# Patient Record
Sex: Male | Born: 1955 | Race: White | Hispanic: No | Marital: Married | Smoking: Current every day smoker
Health system: Southern US, Community
[De-identification: ages and names within clinical notes are randomized; demographics above are authoritative.]

## PROBLEM LIST (undated history)

## (undated) ENCOUNTER — Encounter
Attending: Student in an Organized Health Care Education/Training Program | Primary: Student in an Organized Health Care Education/Training Program

## (undated) ENCOUNTER — Telehealth

## (undated) ENCOUNTER — Encounter

## (undated) ENCOUNTER — Ambulatory Visit

## (undated) ENCOUNTER — Encounter: Attending: Pulmonary Disease | Primary: Pulmonary Disease

## (undated) ENCOUNTER — Ambulatory Visit
Payer: BLUE CROSS/BLUE SHIELD | Attending: Student in an Organized Health Care Education/Training Program | Primary: Student in an Organized Health Care Education/Training Program

## (undated) ENCOUNTER — Telehealth
Attending: Student in an Organized Health Care Education/Training Program | Primary: Student in an Organized Health Care Education/Training Program

## (undated) ENCOUNTER — Ambulatory Visit
Payer: MEDICAID | Attending: Student in an Organized Health Care Education/Training Program | Primary: Student in an Organized Health Care Education/Training Program

## (undated) ENCOUNTER — Ambulatory Visit
Attending: Student in an Organized Health Care Education/Training Program | Primary: Student in an Organized Health Care Education/Training Program

## (undated) ENCOUNTER — Other Ambulatory Visit

## (undated) ENCOUNTER — Ambulatory Visit: Payer: MEDICAID

## (undated) ENCOUNTER — Encounter: Attending: Clinical | Primary: Clinical

## (undated) ENCOUNTER — Ambulatory Visit
Payer: MEDICARE | Attending: Student in an Organized Health Care Education/Training Program | Primary: Student in an Organized Health Care Education/Training Program

## (undated) ENCOUNTER — Ambulatory Visit: Attending: Pharmacist | Primary: Pharmacist

## (undated) ENCOUNTER — Telehealth: Attending: Family | Primary: Family

## (undated) ENCOUNTER — Inpatient Hospital Stay

## (undated) ENCOUNTER — Ambulatory Visit: Payer: MEDICARE

## (undated) ENCOUNTER — Ambulatory Visit: Payer: MEDICAID | Attending: Hospitalist | Primary: Hospitalist

## (undated) ENCOUNTER — Ambulatory Visit: Attending: Clinical | Primary: Clinical

## (undated) ENCOUNTER — Ambulatory Visit: Payer: MEDICAID | Attending: Cardiovascular Disease | Primary: Cardiovascular Disease

## (undated) ENCOUNTER — Ambulatory Visit: Payer: MEDICAID | Attending: Nurse Practitioner | Primary: Nurse Practitioner

## (undated) ENCOUNTER — Encounter: Attending: Internal Medicine | Primary: Internal Medicine

## (undated) ENCOUNTER — Encounter: Attending: Hospice and Palliative Medicine | Primary: Hospice and Palliative Medicine

## (undated) ENCOUNTER — Encounter: Attending: Family | Primary: Family

## (undated) DIAGNOSIS — J449 Chronic obstructive pulmonary disease, unspecified: Secondary | ICD-10-CM

## (undated) DIAGNOSIS — F319 Bipolar disorder, unspecified: Secondary | ICD-10-CM

## (undated) DIAGNOSIS — J309 Allergic rhinitis, unspecified: Secondary | ICD-10-CM

## (undated) DIAGNOSIS — F17200 Nicotine dependence, unspecified, uncomplicated: Secondary | ICD-10-CM

## (undated) DIAGNOSIS — F329 Major depressive disorder, single episode, unspecified: Secondary | ICD-10-CM

## (undated) DIAGNOSIS — F10129 Alcohol abuse with intoxication, unspecified: Secondary | ICD-10-CM

## (undated) DIAGNOSIS — S72002A Fracture of unspecified part of neck of left femur, initial encounter for closed fracture: Secondary | ICD-10-CM

## (undated) DIAGNOSIS — R55 Syncope and collapse: Secondary | ICD-10-CM

## (undated) DIAGNOSIS — J45909 Unspecified asthma, uncomplicated: Secondary | ICD-10-CM

## (undated) DIAGNOSIS — I509 Heart failure, unspecified: Secondary | ICD-10-CM

## (undated) HISTORY — DX: Nicotine dependence, unspecified, uncomplicated: F17.200

## (undated) HISTORY — PX: CATARACT EXTRACTION: SUR2

## (undated) HISTORY — DX: Unspecified asthma, uncomplicated: J45.909

## (undated) HISTORY — DX: Allergic rhinitis, unspecified: J30.9

## (undated) HISTORY — DX: Major depressive disorder, single episode, unspecified: F32.9

---

## 2010-03-27 ENCOUNTER — Ambulatory Visit: Payer: Self-pay | Admitting: Family Medicine

## 2010-03-27 DIAGNOSIS — J45909 Unspecified asthma, uncomplicated: Secondary | ICD-10-CM | POA: Insufficient documentation

## 2010-03-27 DIAGNOSIS — J309 Allergic rhinitis, unspecified: Secondary | ICD-10-CM

## 2010-03-27 DIAGNOSIS — F319 Bipolar disorder, unspecified: Secondary | ICD-10-CM

## 2010-03-27 DIAGNOSIS — F17200 Nicotine dependence, unspecified, uncomplicated: Secondary | ICD-10-CM

## 2010-03-27 DIAGNOSIS — F3289 Other specified depressive episodes: Secondary | ICD-10-CM

## 2010-03-27 DIAGNOSIS — F329 Major depressive disorder, single episode, unspecified: Secondary | ICD-10-CM

## 2010-03-27 HISTORY — DX: Other specified depressive episodes: F32.89

## 2010-03-27 HISTORY — DX: Major depressive disorder, single episode, unspecified: F32.9

## 2010-03-27 HISTORY — DX: Nicotine dependence, unspecified, uncomplicated: F17.200

## 2010-03-27 HISTORY — DX: Unspecified asthma, uncomplicated: J45.909

## 2010-03-27 HISTORY — DX: Allergic rhinitis, unspecified: J30.9

## 2010-03-27 LAB — CONVERTED CEMR LAB
Blood in Urine, dipstick: NEGATIVE
Nitrite: NEGATIVE

## 2010-03-30 LAB — CONVERTED CEMR LAB
AST: 19 units/L (ref 0–37)
Alkaline Phosphatase: 50 units/L (ref 39–117)
Basophils Absolute: 0.1 10*3/uL (ref 0.0–0.1)
Bilirubin, Direct: 0.2 mg/dL (ref 0.0–0.3)
CO2: 31 meq/L (ref 19–32)
Calcium: 9.2 mg/dL (ref 8.4–10.5)
Creatinine, Ser: 0.6 mg/dL (ref 0.4–1.5)
Eosinophils Absolute: 0.6 10*3/uL (ref 0.0–0.7)
GFR calc non Af Amer: 150.22 mL/min (ref >60)
Glucose, Bld: 85 mg/dL (ref 70–99)
HDL: 62 mg/dL (ref >39.00)
Hemoglobin: 14.9 g/dL (ref 13.0–17.0)
Lymphocytes Relative: 22.4 % (ref 12.0–46.0)
MCHC: 34.9 g/dL (ref 30.0–36.0)
Monocytes Relative: 8.7 % (ref 3.0–12.0)
Neutro Abs: 6.5 10*3/uL (ref 1.4–7.7)
Neutrophils Relative %: 62.5 % (ref 43.0–77.0)
RBC: 4.26 M/uL (ref 4.22–5.81)
RDW: 14.9 % — ABNORMAL HIGH (ref 11.5–14.6)
Sodium: 144 meq/L (ref 135–145)
Total CHOL/HDL Ratio: 3
Triglycerides: 114 mg/dL (ref 0.0–149.0)
VLDL: 22.8 mg/dL (ref 0.0–40.0)

## 2010-04-17 ENCOUNTER — Ambulatory Visit: Payer: Self-pay | Admitting: Family Medicine

## 2010-04-30 ENCOUNTER — Telehealth: Payer: Self-pay | Admitting: Family Medicine

## 2010-05-14 ENCOUNTER — Telehealth: Payer: Self-pay | Admitting: Family Medicine

## 2010-11-06 NOTE — Progress Notes (Signed)
Summary: Joel Simmons please call  Phone Note Call from Patient Call back at [phone]   Caller: Spouse-Joel Simmons Call For: Roderick Pee MD Summary of Call: Joel Simmons please call Joel Simmons today Initial call taken by: Heron Sabins,  May 14, 2010 2:51 PM  Follow-up for Phone Call        left message on machine returning Joel Simmons's call Follow-up by: Kern Reap CMA Duncan Dull),  May 14, 2010 4:35 PM  Additional Follow-up for Phone Call Additional follow up Details #1::        patient would like celexa up to 30mg  or 40 mg because it is wearing off around lunch time. called into [facility] in [place].   Additional Follow-up by: Kern Reap CMA Duncan Dull),  May 15, 2010 8:48 AM    Additional Follow-up for Phone Call Additional follow up Details #2::    okay to increase the Celexa to 30 mg nightly if in 3 weeks she's not feeling better, then will increase it to 40 Follow-up by: Roderick Pee MD,  May 15, 2010 11:13 AM  New/Updated Medications: CELEXA 20 MG TABS (CITALOPRAM HYDROBROMIDE) take one and half tab by mouth at bedtime Prescriptions: CELEXA 20 MG TABS (CITALOPRAM HYDROBROMIDE) take one and half tab by mouth at bedtime  #135 x 0   Entered by:   Kern Reap CMA (AAMA)   Authorized by:   Roderick Pee MD   Signed by:   Kern Reap CMA (AAMA) on 05/15/2010   Method used:   Electronically to        [facility] [address] (retail)       [address], [address]       [place], [place]  [address]       Ph: [phone]       Fax: [phone]   RxID:   [id number]

## 2010-11-06 NOTE — Progress Notes (Signed)
Summary: needs new rx  Phone Note Call from Patient Call back at [phone]   Caller: Spouse---live call Summary of Call: needs new script for second dosage of chantix. call [facility] in [place]. Initial call taken by: Warnell Forester,  April 30, 2010 8:08 AM    New/Updated Medications: CHANTIX CONTINUING MONTH PAK 1 MG TABS (VARENICLINE TARTRATE) use as directed Prescriptions: CHANTIX CONTINUING MONTH PAK 1 MG TABS (VARENICLINE TARTRATE) use as directed  #1 x 3   Entered by:   Kern Reap CMA (AAMA)   Authorized by:   Roderick Pee MD   Signed by:   Kern Reap CMA (AAMA) on 04/30/2010   Method used:   Electronically to        [facility] [address] (retail)       [address], [address]       [place], [place]  [address]       Ph: [phone]       Fax: [phone]   RxID:   [id number]

## 2010-11-06 NOTE — Assessment & Plan Note (Signed)
Summary: 3 WEEEK FOLLOW UP/CJR   History of Present Illness: Joel Simmons is a 55 year old, married male ex smoker x 3 days!!!!!!!!!!!!!!!!!!! and comes in today for reevaluation of tobacco abuse and depression.  He is currently on 20 mg of Celexa nightly his mood is markedly improved.  No major side effects.  His chantix doses, one tablet b.i.d. he's not had a cigarette in 3 days  Allergies: No Known Drug Allergies  Past History:  Past medical, surgical, family and social histories (including risk factors) reviewed for relevance to current acute and chronic problems.  Past Medical History: Reviewed history from 03/27/2010 and no changes required. Allergic rhinitis  Past Surgical History: Reviewed history from 03/27/2010 and no changes required. Denies surgical history Cataract extraction.07  Family History: Reviewed history from 03/27/2010 and no changes required. Father: deceased - MI and hx prostate cancer Mother: deceased - colon cancer Siblings: 1 brother - deceased - lung cancer               1 brother - hx of gout               1 brother - healthy              2 sisters - healthy              4 children - healthy Family History Depression.........father and sister  Social History: Reviewed history from 03/27/2010 and no changes required. Occupation:contractor Married Current Smoker Alcohol use-yes Drug use-no  Review of Systems      See HPI  Physical Exam  General:  Well-developed,well-nourished,in no acute distress; alert,appropriate and cooperative throughout examination   Impression & Recommendations:  Problem # 1:  DEPRESSIVE DISORDER (ICD-311) Assessment Improved  His updated medication list for this problem includes:    Celexa 20 Mg Tabs (Citalopram hydrobromide) .Marland Kitchen... 1 tab @ bedtime  Problem # 2:  TOBACCO USE (ICD-305.1) Assessment: Improved  His updated medication list for this problem includes:    Chantix Starting Month Pak 0.5 Mg X 11 & 1 Mg  X 42 Tabs (Varenicline tartrate) ..... Uad  Orders: T-2 View CXR (71020TC)  Complete Medication List: 1)  Chantix Starting Month Pak 0.5 Mg X 11 & 1 Mg X 42 Tabs (Varenicline tartrate) .... Uad 2)  Celexa 20 Mg Tabs (Citalopram hydrobromide) .Marland Kitchen.. 1 tab @ bedtime  Patient Instructions: 1)  continue the Celexa at 20 mg daily at bedtime. 2)  Decreased the chantix to one half tablet twice daily.  Return in 3 months for follow

## 2010-11-06 NOTE — Assessment & Plan Note (Signed)
Summary: to be est/?depression/njr   Vital Signs:  Patient profile:   55 year old male Height:      67 inches Weight:      154 pounds BMI:     24.21 Temp:     98.7 degrees F oral BP sitting:   118 / 80  (left arm) Cuff size:   regular  Vitals Entered By: Joel Simmons CMA Joel Simmons) (March 27, 2010 4:07 PM) CC: new to establish care Is Patient Diabetic? No Pain Assessment Patient in pain? no        CC:  new to establish care.  History of Present Illness: Joel Simmons is a 55 year old, married male, smoker, one pack per day, who comes in today as a new patient for evaluation of anxiety attacks, mood swings, tobacco abuse.  He served and hospitalized overnight.  He said outpatient.  Bilateral cataract surgery in 2007 he does smoke a pack of cigarettes a day.  He also drinks excessive alcohol when he gets depressed.  His depression is characterized by the days where he feels normal.  Followed by mood swings.  His father and sister had a history of depression.  Review of systems he needs reading glasses.  He does get regular dental care.  Never had a colonoscopy does have some seasonal allergic rhinitis.  Otherwise, review of systems negative.  Last tetanus booster unknown  Preventive Screening-Counseling & Management  Alcohol-Tobacco     Smoking Status: current     Packs/Day: 1.5  Hep-HIV-STD-Contraception     Dental Visit-last 6 months yes      Drug Use:  no.    Allergies (verified): No Known Drug Allergies  Past History:  Past medical, surgical, family and social histories (including risk factors) reviewed, and no changes noted (except as noted below).  Past Medical History: Allergic rhinitis  Past Surgical History: Denies surgical history Cataract extraction.07  Family History: Reviewed history and no changes required. Father: deceased - MI and hx prostate cancer Mother: deceased - colon cancer Siblings: 1 brother - deceased - lung cancer               1 brother - hx  of gout               1 brother - healthy              2 sisters - healthy              4 children - healthy Family History Depression.........father and sister  Social History: Reviewed history and no changes required. Occupation:contractor Married Current Smoker Alcohol use-yes Drug use-no Smoking Status:  current Packs/Day:  1.5 Drug Use:  no Dental Care w/in 6 mos.:  yes  Review of Systems      See HPI  Physical Exam  General:  Well-developed,well-nourished,in no acute distress; alert,appropriate and cooperative throughout examination Head:  Normocephalic and atraumatic without obvious abnormalities. No apparent alopecia or balding. Eyes:  No corneal or conjunctival inflammation noted. EOMI. Perrla. Funduscopic exam benign, without hemorrhages, exudates or papilledema. Vision grossly normal. Ears:  External ear exam shows no significant lesions or deformities.  Otoscopic examination reveals clear canals, tympanic membranes are intact bilaterally without bulging, retraction, inflammation or discharge. Hearing is grossly normal bilaterally. Nose:  External nasal examination shows no deformity or inflammation. Nasal mucosa are pink and moist without lesions or exudates. Mouth:  Oral mucosa and oropharynx without lesions or exudates.  Teeth in good repair. Neck:  No deformities, masses, or  tenderness noted. Chest Wall:  No deformities, masses, tenderness or gynecomastia noted. Breasts:  No masses or gynecomastia noted Lungs:  symmetrical breath sounds bilateral wheezing Heart:  Normal rate and regular rhythm. S1 and S2 normal without gallop, murmur, click, rub or other extra sounds. Abdomen:  Bowel sounds positive,abdomen soft and non-tender without masses, organomegaly or hernias noted. Rectal:  No external abnormalities noted. Normal sphincter tone. No rectal masses or tenderness. Genitalia:  Testes bilaterally descended without nodularity, tenderness or masses. No scrotal  masses or lesions. No penis lesions or urethral discharge. Prostate:  Prostate gland firm and smooth, no enlargement, nodularity, tenderness, mass, asymmetry or induration. Msk:  No deformity or scoliosis noted of thoracic or lumbar spine.   Pulses:  R and L carotid,radial,femoral,dorsalis pedis and posterior tibial pulses are full and equal bilaterally Extremities:  No clubbing, cyanosis, edema, or deformity noted with normal full range of motion of all joints.   Neurologic:  No cranial nerve deficits noted. Station and gait are normal. Plantar reflexes are down-going bilaterally. DTRs are symmetrical throughout. Sensory, motor and coordinative functions appear intact. Skin:  Intact without suspicious lesions or rashes Cervical Nodes:  No lymphadenopathy noted Axillary Nodes:  No palpable lymphadenopathy Inguinal Nodes:  No significant adenopathy Psych:  Cognition and judgment appear intact. Alert and cooperative with normal attention span and concentration. No apparent delusions, illusions, hallucinations   Problems:  Medical Problems Added: 1)  Dx of Depressive Disorder  (ICD-311) 2)  Dx of Asthma  (ICD-493.90) 3)  Dx of Tobacco Use  (ICD-305.1) 4)  Dx of Family History Depression  (ICD-V17.0) 5)  Dx of Allergic Rhinitis  (ICD-477.9)  Impression & Recommendations:  Problem # 1:  DEPRESSIVE DISORDER (ICD-311) Assessment New  His updated medication list for this problem includes:    Celexa 20 Mg Tabs (Citalopram hydrobromide) .Marland Kitchen... 1 tab @ bedtime  Orders: Venipuncture (76283) TLB-Lipid Panel (80061-LIPID) TLB-BMP (Basic Metabolic Panel-BMET) (80048-METABOL) TLB-CBC Platelet - w/Differential (85025-CBCD) TLB-Hepatic/Liver Function Pnl (80076-HEPATIC) TLB-TSH (Thyroid Stimulating Hormone) (84443-TSH) TLB-PSA (Prostate Specific Antigen) (84153-PSA) UA Dipstick w/o Micro (automated)  (81003) EKG w/ Interpretation (93000)  Problem # 2:  ASTHMA (ICD-493.90) Assessment:  New  Orders: Venipuncture (15176) TLB-Lipid Panel (80061-LIPID) TLB-BMP (Basic Metabolic Panel-BMET) (80048-METABOL) TLB-CBC Platelet - w/Differential (85025-CBCD) TLB-Hepatic/Liver Function Pnl (80076-HEPATIC) TLB-TSH (Thyroid Stimulating Hormone) (84443-TSH) TLB-PSA (Prostate Specific Antigen) (84153-PSA) UA Dipstick w/o Micro (automated)  (81003) EKG w/ Interpretation (93000)  Problem # 3:  TOBACCO USE (ICD-305.1) Assessment: New  His updated medication list for this problem includes:    Chantix Starting Month Pak 0.5 Mg X 11 & 1 Mg X 42 Tabs (Varenicline tartrate) ..... Uad  Orders: Venipuncture (16073) TLB-Lipid Panel (80061-LIPID) TLB-BMP (Basic Metabolic Panel-BMET) (80048-METABOL) TLB-CBC Platelet - w/Differential (85025-CBCD) TLB-Hepatic/Liver Function Pnl (80076-HEPATIC) TLB-TSH (Thyroid Stimulating Hormone) (84443-TSH) TLB-PSA (Prostate Specific Antigen) (84153-PSA) UA Dipstick w/o Micro (automated)  (81003) EKG w/ Interpretation (93000)  Complete Medication List: 1)  Chantix Starting Month Pak 0.5 Mg X 11 & 1 Mg X 42 Tabs (Varenicline tartrate) .... Uad 2)  Celexa 20 Mg Tabs (Citalopram hydrobromide) .Marland Kitchen.. 1 tab @ bedtime  Other Orders: Tdap => 63yrs IM (71062) Admin 1st Vaccine (69485)  Patient Instructions: 1)  begin Celexa 20 mg nightly. 2)  Begin the smoking cessation program was chantix as outlined.  Return in 3 weeks for follow-up 3)  Schedule a colonoscopy/sigmoidoscopy to help detect colon cancer. 4)  Take an Aspirin every day. Prescriptions: CELEXA 20 MG TABS (CITALOPRAM HYDROBROMIDE) 1 tab @  bedtime  #100 x 3   Entered and Authorized by:   Roderick Pee MD   Signed by:   Roderick Pee MD on 03/27/2010   Method used:   Print then Give to Patient   RxID:   [id number] CHANTIX STARTING MONTH PAK 0.5 MG X 11 & 1 MG X 42 TABS (VARENICLINE TARTRATE) UAD  #1 x 0   Entered and Authorized by:   Roderick Pee MD   Signed by:   Roderick Pee MD on  03/27/2010   Method used:   Print then Give to Patient   RxID:   [id number]    Immunizations Administered:  Tetanus Vaccine:    Vaccine Type: Tdap    Site: left deltoid    Mfr: GlaxoSmithKline    Dose: 0.5 ml    Route: IM    Given by: Joel Simmons CMA (AAMA)    Exp. Date: 12/29/2011    Lot #: [id number]    Physician counseled: yes   Laboratory Results   Urine Tests    Routine Urinalysis   Color: yellow Appearance: Clear Glucose: negative   (Normal Range: Negative) Bilirubin: 1+   (Normal Range: Negative) Ketone: trace (5)   (Normal Range: Negative) Spec. Gravity: >=1.030   (Normal Range: 1.003-1.035) Blood: negative   (Normal Range: Negative) pH: 5.5   (Normal Range: 5.0-8.0) Protein: trace   (Normal Range: Negative) Urobilinogen: 2.0   (Normal Range: 0-1) Nitrite: negative   (Normal Range: Negative) Leukocyte Esterace: trace   (Normal Range: Negative)    Comments: Rita Ohara  March 27, 2010 5:01 PM

## 2011-08-06 ENCOUNTER — Telehealth: Payer: Self-pay | Admitting: Family Medicine

## 2011-08-06 NOTE — Telephone Encounter (Signed)
Pts wife called and said that pts medical records that were sent to pts life insurance, did not match up with the questionaire. Pts spouse is req call back from nurse.

## 2011-08-07 NOTE — Telephone Encounter (Signed)
patient  Needs a letter stating that he is now drinking less alcohol because he is not depressed and is taking his medication.  Is this ok to give to patient?  Ch[facility] Cous[facility] - [facility] - [phone], [place], [address]

## 2011-08-08 NOTE — Telephone Encounter (Signed)
ok 

## 2011-08-09 NOTE — Telephone Encounter (Signed)
Letter mailed

## 2011-12-19 ENCOUNTER — Other Ambulatory Visit: Payer: Self-pay | Admitting: Family Medicine

## 2011-12-19 ENCOUNTER — Telehealth: Payer: Self-pay | Admitting: *Deleted

## 2011-12-19 NOTE — Telephone Encounter (Signed)
Wife calling back and would like a refill of Celexa.  They are unable to come in for an office visit

## 2011-12-19 NOTE — Telephone Encounter (Signed)
Last visit 7/11 okay to fill?

## 2011-12-19 NOTE — Telephone Encounter (Signed)
Left message on machine for patient to call back for an appointment for more refills

## 2011-12-19 NOTE — Telephone Encounter (Signed)
Pt need refill on celexa 20 mg call into [facility] in [place]

## 2011-12-20 ENCOUNTER — Encounter: Payer: Self-pay | Admitting: Family Medicine

## 2011-12-20 NOTE — Telephone Encounter (Signed)
Left message on machine mediation denied per Dr Tawanna Cooler without an office visit

## 2011-12-23 ENCOUNTER — Ambulatory Visit (INDEPENDENT_AMBULATORY_CARE_PROVIDER_SITE_OTHER): Payer: Self-pay | Admitting: Internal Medicine

## 2011-12-23 ENCOUNTER — Encounter: Payer: Self-pay | Admitting: Internal Medicine

## 2011-12-23 VITALS — BP 122/90 | Temp 97.7°F | Wt 171.0 lb

## 2011-12-23 DIAGNOSIS — F17200 Nicotine dependence, unspecified, uncomplicated: Secondary | ICD-10-CM

## 2011-12-23 DIAGNOSIS — F329 Major depressive disorder, single episode, unspecified: Secondary | ICD-10-CM

## 2011-12-23 DIAGNOSIS — J45909 Unspecified asthma, uncomplicated: Secondary | ICD-10-CM

## 2011-12-23 MED ORDER — CITALOPRAM HYDROBROMIDE 20 MG PO TABS: 20.0000 mg | ORAL_TABLET | Freq: Every day | ORAL | Status: DC

## 2011-12-23 NOTE — Patient Instructions (Signed)
Smoking tobacco is very bad for your health. You should stop smoking immediately.  Consider over-the-counter nicotine patch  Annual exam with Dr. Tawanna Cooler in 6 months

## 2011-12-23 NOTE — Progress Notes (Signed)
  Subjective:    Patient ID: Joel Simmons, male    DOB: January 22, 1956, 56 y.o.   MRN: [id number]  HPI  56 year old patient who has a history depression and is seen here basically for a medication refill. He has done quite well on citalopram and presently is on a 20 mg dose. This has been successfully down titrated. He feels that he still receives considerable benefit from this medication. He has a history of asthma ongoing tobacco use. He was successfully off tobacco for 3 months following treatment with Chantix but of course has relapsed. He is back smoking one pack per day. Denies any pulmonary complaints    Review of Systems  Constitutional: Negative for fever, chills, appetite change and fatigue.  HENT: Negative for hearing loss, ear pain, congestion, sore throat, trouble swallowing, neck stiffness, dental problem, voice change and tinnitus.   Eyes: Negative for pain, discharge and visual disturbance.  Respiratory: Negative for cough, chest tightness, wheezing and stridor.   Cardiovascular: Negative for chest pain, palpitations and leg swelling.  Gastrointestinal: Negative for nausea, vomiting, abdominal pain, diarrhea, constipation, blood in stool and abdominal distention.  Genitourinary: Negative for urgency, hematuria, flank pain, discharge, difficulty urinating and genital sores.  Musculoskeletal: Negative for myalgias, back pain, joint swelling, arthralgias and gait problem.  Skin: Negative for rash.  Neurological: Negative for dizziness, syncope, speech difficulty, weakness, numbness and headaches.  Hematological: Negative for adenopathy. Does not bruise/bleed easily.  Psychiatric/Behavioral: Negative for behavioral problems and dysphoric mood. The patient is not nervous/anxious.        Objective:   Physical Exam  Constitutional: He is oriented to person, place, and time. He appears well-developed and well-nourished. No distress.       Blood pressure normal  HENT:  Head:  Normocephalic.  Right Ear: External ear normal.  Left Ear: External ear normal.  Eyes: Conjunctivae and EOM are normal.  Neck: Normal range of motion.  Cardiovascular: Normal rate and normal heart sounds.   Pulmonary/Chest: Effort normal.       Coarse scattered rhonchi  Abdominal: Bowel sounds are normal.  Musculoskeletal: Normal range of motion. He exhibits no edema and no tenderness.  Neurological: He is alert and oriented to person, place, and time.  Psychiatric: He has a normal mood and affect. His behavior is normal.          Assessment & Plan:   History depression. Stable on Celexa. Medications refilled  Ongoing tobacco use and history of asthma. Smoking cessation encouraged  Return in 6 months for annual exam

## 2013-03-12 ENCOUNTER — Other Ambulatory Visit: Payer: Self-pay | Admitting: Internal Medicine

## 2013-03-15 ENCOUNTER — Telehealth: Payer: Self-pay | Admitting: Family Medicine

## 2013-03-15 MED ORDER — CITALOPRAM HYDROBROMIDE 20 MG PO TABS: 20.0000 mg | ORAL_TABLET | Freq: Every day | ORAL | Status: DC

## 2013-03-15 NOTE — Telephone Encounter (Signed)
Pt needs refill citalopram (CELEXA) 20 MG tablet.  Advised pt they needed appt. For futre refills. Set appt 6/24 @ 4:15pm  Can you send a 30 day until appt? Pls change pt's pharm to  [facility] [address] [place], [place] [address] [phone]

## 2013-03-30 ENCOUNTER — Ambulatory Visit: Payer: Self-pay | Admitting: Family Medicine

## 2013-04-06 ENCOUNTER — Ambulatory Visit: Payer: Self-pay | Admitting: Family Medicine

## 2013-04-08 ENCOUNTER — Encounter: Payer: Self-pay | Admitting: Family Medicine

## 2013-04-08 ENCOUNTER — Ambulatory Visit (INDEPENDENT_AMBULATORY_CARE_PROVIDER_SITE_OTHER): Payer: Self-pay | Admitting: Family Medicine

## 2013-04-08 VITALS — BP 122/74 | HR 76 | Temp 98.1°F | Ht 72.0 in | Wt 168.0 lb

## 2013-04-08 DIAGNOSIS — R5383 Other fatigue: Secondary | ICD-10-CM

## 2013-04-08 DIAGNOSIS — R5381 Other malaise: Secondary | ICD-10-CM | POA: Insufficient documentation

## 2013-04-08 DIAGNOSIS — F17200 Nicotine dependence, unspecified, uncomplicated: Secondary | ICD-10-CM

## 2013-04-08 DIAGNOSIS — J45909 Unspecified asthma, uncomplicated: Secondary | ICD-10-CM

## 2013-04-08 DIAGNOSIS — F329 Major depressive disorder, single episode, unspecified: Secondary | ICD-10-CM

## 2013-04-08 MED ORDER — VARENICLINE TARTRATE 1 MG PO TABS: ORAL_TABLET | ORAL | Status: DC

## 2013-04-08 MED ORDER — CHLORDIAZEPOXIDE HCL 10 MG PO CAPS: 10.0000 mg | ORAL_CAPSULE | Freq: Three times a day (TID) | ORAL | Status: DC | PRN

## 2013-04-08 MED ORDER — CITALOPRAM HYDROBROMIDE 20 MG PO TABS: 20.0000 mg | ORAL_TABLET | Freq: Every day | ORAL | Status: DC

## 2013-04-08 NOTE — Progress Notes (Signed)
  Subjective:    Patient ID: Antwann Preziosi, male    DOB: 06/13/1956, 57 y.o.   MRN: [id number]  HPI Mckenzie is a 57 year old male married smoker one pack of cigarettes for many years who comes in today for evaluation of multiple issues  His primary concern is fatigue for the last 8 months. He says he wakes up in the morning as tired as he went to bed. His wife says he has sleep apnea and she has to him to wake him up and reads.  He's also a smoker of one pack of cigarettes for many years. We tried the Chantix program however increase the dose however he began having vivid nightmares.  His wife has also noticed over the last couple years that his breathing is getting worse and he wheezes a lot.  20 years ago he had difficulty swallowing. Barium swallow demonstrated a lower esophageal narrowing. He has trouble was taken hotdogs and he would sometimes have to vomit to get food to pass.  He states he otherwise feels well no trouble with his vision,,,,,, bilateral cataracts,,,,, hearing cough but nothing unusual no sputum production no notices no weight loss. Urine normal bowel movements normal decreased erectile function no other complaints   Review of Systems Review of systems as above otherwise negative    Objective:   Physical Exam Well-developed well-nourished male no acute distress HEENT were negative neck was supple no adenopathy thyroid normal pulmonary  exam shows decreased breath sounds symmetrically inspiratory and expiratory wheezing  Abdominal exam negative       Assessment & Plan:  History of mild depression continue Celexa  Tobacco abuse restart the Chantix low-dose add Librium because of anxiety  Taper off nicotine  If the sleep apnea persist then would consider pulmonary consult  History of lower esophageal sphincter recommend GI evaluation

## 2013-04-08 NOTE — Patient Instructions (Signed)
Continue the Celexa  And Librium 10 mg,,,,,,,,,,, 1 tab twice a day  Begin tapering as outlined  Chantix 1 mg......... One half tab every morning  Followup in one month sooner if any problems  For the lower esophageal she'll sphincter dysfunction remember drink lots of water GU food very carefully  We will consider pulmonary consult for evaluation of the wheezing and possible sleep apnea

## 2013-05-10 ENCOUNTER — Ambulatory Visit: Payer: Self-pay | Admitting: Family Medicine

## 2013-07-13 ENCOUNTER — Ambulatory Visit: Payer: Self-pay | Admitting: Family Medicine

## 2013-08-02 ENCOUNTER — Ambulatory Visit: Payer: Self-pay | Admitting: Family Medicine

## 2013-08-16 ENCOUNTER — Ambulatory Visit (INDEPENDENT_AMBULATORY_CARE_PROVIDER_SITE_OTHER): Payer: Self-pay | Admitting: Family Medicine

## 2013-08-16 ENCOUNTER — Encounter: Payer: Self-pay | Admitting: Family Medicine

## 2013-08-16 VITALS — BP 110/70 | Temp 98.2°F | Wt 170.0 lb

## 2013-08-16 DIAGNOSIS — F329 Major depressive disorder, single episode, unspecified: Secondary | ICD-10-CM

## 2013-08-16 MED ORDER — CITALOPRAM HYDROBROMIDE 20 MG PO TABS: 20.0000 mg | ORAL_TABLET | Freq: Every day | ORAL | Status: AC

## 2013-08-16 NOTE — Patient Instructions (Signed)
Continue the Celexa 20 mg daily  Retry the Chantix program,,,,,,,,,,,,, a half a tablet Monday Wednesday Friday for 2 weeks then a half a tablet daily  Return in one year sooner if any problems

## 2013-08-16 NOTE — Progress Notes (Signed)
  Subjective:    Patient ID: Joel Simmons, male    DOB: June 14, 1956, 57 y.o.   MRN: [id number]  HPI Joel Simmons is a 57 year old married male smoker of cigarettes a day who comes in today for yearly followup  He's been on Celexa 20 mg daily for mild depression and Librium 10 mg when necessary for anxiety. He does not take the Librium. He wishes to stay on the Celexa. He tried a full dose of Effexor for smoking cessation but given the nightmares   Review of Systems    review of systems negative Objective:   Physical Exam  Well-developed well-nourished male no acute distress vital signs stable he is afebrile      Assessment & Plan:  History of depression continue Celexa Librium when necessary  Tobacco abuse retry Chantix program

## 2017-06-27 ENCOUNTER — Ambulatory Visit
Admission: RE | Admit: 2017-06-27 | Discharge: 2017-06-27 | Disposition: A | Discharge status: Home / Self Care | Payer: BC Managed Care – PPO

## 2017-06-27 DIAGNOSIS — J189 Pneumonia, unspecified organism: Principal | ICD-10-CM

## 2018-03-23 ENCOUNTER — Other Ambulatory Visit: Payer: Self-pay

## 2018-03-23 ENCOUNTER — Emergency Department (HOSPITAL_COMMUNITY): Payer: BLUE CROSS/BLUE SHIELD

## 2018-03-23 ENCOUNTER — Emergency Department (HOSPITAL_COMMUNITY)
Admission: EM | Admit: 2018-03-23 | Discharge: 2018-03-23 | Disposition: A | Discharge status: Home / Self Care | Payer: BLUE CROSS/BLUE SHIELD | Attending: Emergency Medicine | Admitting: Emergency Medicine

## 2018-03-23 ENCOUNTER — Encounter (HOSPITAL_COMMUNITY): Payer: Self-pay

## 2018-03-23 DIAGNOSIS — J441 Chronic obstructive pulmonary disease with (acute) exacerbation: Secondary | ICD-10-CM | POA: Insufficient documentation

## 2018-03-23 DIAGNOSIS — Z23 Encounter for immunization: Secondary | ICD-10-CM | POA: Insufficient documentation

## 2018-03-23 DIAGNOSIS — Z043 Encounter for examination and observation following other accident: Secondary | ICD-10-CM | POA: Diagnosis not present

## 2018-03-23 DIAGNOSIS — R0600 Dyspnea, unspecified: Secondary | ICD-10-CM | POA: Diagnosis present

## 2018-03-23 DIAGNOSIS — I509 Heart failure, unspecified: Secondary | ICD-10-CM | POA: Diagnosis not present

## 2018-03-23 DIAGNOSIS — W19XXXA Unspecified fall, initial encounter: Secondary | ICD-10-CM

## 2018-03-23 DIAGNOSIS — R55 Syncope and collapse: Secondary | ICD-10-CM | POA: Insufficient documentation

## 2018-03-23 DIAGNOSIS — F17210 Nicotine dependence, cigarettes, uncomplicated: Secondary | ICD-10-CM | POA: Insufficient documentation

## 2018-03-23 DIAGNOSIS — Z79899 Other long term (current) drug therapy: Secondary | ICD-10-CM | POA: Diagnosis not present

## 2018-03-23 HISTORY — DX: Heart failure, unspecified: I50.9

## 2018-03-23 HISTORY — DX: Chronic obstructive pulmonary disease, unspecified: J44.9

## 2018-03-23 HISTORY — DX: Bipolar disorder, unspecified: F31.9

## 2018-03-23 LAB — CBC WITH DIFFERENTIAL/PLATELET
Abs Immature Granulocytes: 0.1 10*3/uL (ref 0.0–0.1)
BASOS ABS: 0.1 10*3/uL (ref 0.0–0.1)
Basophils Relative: 1 %
EOS ABS: 0.4 10*3/uL (ref 0.0–0.7)
EOS PCT: 5 %
HCT: 39.2 % (ref 39.0–52.0)
Hemoglobin: 12.9 g/dL — ABNORMAL LOW (ref 13.0–17.0)
Immature Granulocytes: 1 %
Lymphocytes Relative: 24 %
Lymphs Abs: 2.3 10*3/uL (ref 0.7–4.0)
MCH: 34 pg (ref 26.0–34.0)
MCHC: 32.9 g/dL (ref 30.0–36.0)
MCV: 103.4 fL — ABNORMAL HIGH (ref 78.0–100.0)
Monocytes Absolute: 0.7 10*3/uL (ref 0.1–1.0)
Monocytes Relative: 7 %
Neutro Abs: 6 10*3/uL (ref 1.7–7.7)
Neutrophils Relative %: 62 %
PLATELETS: 247 10*3/uL (ref 150–400)
RBC: 3.79 MIL/uL — AB (ref 4.22–5.81)
RDW: 14.7 % (ref 11.5–15.5)
WBC: 9.6 10*3/uL (ref 4.0–10.5)

## 2018-03-23 LAB — BASIC METABOLIC PANEL
Anion gap: 11 (ref 5–15)
BUN: <5 mg/dL — ABNORMAL LOW (ref 6–20)
CALCIUM: 9 mg/dL (ref 8.9–10.3)
CO2: 29 mmol/L (ref 22–32)
CREATININE: 0.64 mg/dL (ref 0.61–1.24)
Chloride: 99 mmol/L — ABNORMAL LOW (ref 101–111)
GFR calc Af Amer: >60 mL/min (ref >60)
GFR calc non Af Amer: >60 mL/min (ref >60)
Glucose, Bld: 94 mg/dL (ref 65–99)
Potassium: 4.1 mmol/L (ref 3.5–5.1)
SODIUM: 139 mmol/L (ref 135–145)

## 2018-03-23 LAB — I-STAT TROPONIN, ED: Troponin i, poc: 0 ng/mL (ref 0.00–0.08)

## 2018-03-23 MED ORDER — ALBUTEROL SULFATE HFA 108 (90 BASE) MCG/ACT IN AERS
2.0000 | INHALATION_SPRAY | Freq: Once | RESPIRATORY_TRACT | Status: AC
  Administered 2018-03-23: 2 via RESPIRATORY_TRACT
  Filled 2018-03-23: qty 6.7

## 2018-03-23 MED ORDER — TETANUS-DIPHTH-ACELL PERTUSSIS 5-2.5-18.5 LF-MCG/0.5 IM SUSP
0.5000 mL | Freq: Once | INTRAMUSCULAR | Status: AC
  Administered 2018-03-23: 0.5 mL via INTRAMUSCULAR
  Filled 2018-03-23: qty 0.5

## 2018-03-23 MED ORDER — METHYLPREDNISOLONE SODIUM SUCC 125 MG IJ SOLR
125.0000 mg | Freq: Once | INTRAMUSCULAR | Status: AC
  Administered 2018-03-23: 125 mg via INTRAVENOUS
  Filled 2018-03-23: qty 2

## 2018-03-23 MED ORDER — IPRATROPIUM-ALBUTEROL 0.5-2.5 (3) MG/3ML IN SOLN
3.0000 mL | RESPIRATORY_TRACT | Status: AC
  Administered 2018-03-23: 3 mL via RESPIRATORY_TRACT
  Filled 2018-03-23 (×2): qty 3

## 2018-03-23 NOTE — ED Notes (Signed)
ED Provider at bedside. 

## 2018-03-23 NOTE — ED Provider Notes (Signed)
[facility] Provider Note   CSN: [id number] Arrival date & time: 03/23/18  1618   History   Chief Complaint Chief Complaint  Patient presents with  . Shortness of Breath  . Fall    HPI Joel PancoastRandy Simmons is a 62 y.o. male.  The history is provided by the patient and a relative. The history is limited by the condition of the patient (pt intoxicataed).   62 yo M with PMHx COPD (on 2L [place]), who presents after a fall at home. Was found by neighbor just prior to arrival, laying by his tractor. Reportedly unconscious at the time. Daughter reports EtOH use, patient denies this. States he was on his tractor and fell. Denies LOC. Patient's only complaint is of dyspnea, which has been acutely worse over last 2 days. No relief with prednisone, antibiotics. Daughter believes patient has been at baseline for past week. Patient denies headaches, chest pain, neck pain, fever, or any other complaints.   Past Medical History:  Diagnosis Date  . ALLERGIC RHINITIS 03/27/2010   Qualifier: Diagnosis of  By: Thurmon FairVereen CMA (AAMA), Fleet Contrasachel    . ASTHMA 03/27/2010   Qualifier: Diagnosis of  By: Tawanna Coolerodd MD, Eugenio HoesJeffrey A   . Bipolar 1 disorder (HCC)   . CHF (congestive heart failure) (HCC)   . COPD (chronic obstructive pulmonary disease) (HCC)   . DEPRESSIVE DISORDER 03/27/2010   Qualifier: Diagnosis of  By: Tawanna Coolerodd MD, Eugenio HoesJeffrey A   . TOBACCO USE 03/27/2010   Qualifier: Diagnosis of  By: Tawanna Coolerodd MD, Eugenio HoesJeffrey A   . TOBACCO USE 03/27/2010   Qualifier: Diagnosis of  By: Tawanna Coolerodd MD, Eugenio HoesJeffrey A     Patient Active Problem List   Diagnosis Date Noted  . Other malaise and fatigue 04/08/2013  . TOBACCO USE 03/27/2010  . DEPRESSIVE DISORDER 03/27/2010  . ALLERGIC RHINITIS 03/27/2010  . ASTHMA 03/27/2010    Past Surgical History:  Procedure Laterality Date  . CATARACT EXTRACTION          Home Medications    Prior to Admission medications   Medication Sig Start Date End Date Taking?  Authorizing Provider  montelukast (SINGULAIR) 10 MG tablet Take 10 mg by mouth daily. 01/15/18  Yes [provider]  chlordiazePOXIDE (LIBRIUM) 10 MG capsule Take 1 capsule (10 mg total) by mouth 3 (three) times daily as needed for anxiety. Patient not taking: Reported on 03/23/2018 04/08/13   Roderick Peeodd, Jeffrey A, MD  citalopram (CELEXA) 20 MG tablet Take 1 tablet (20 mg total) by mouth daily. Patient not taking: Reported on 03/23/2018 08/16/13   Roderick Peeodd, Jeffrey A, MD  varenicline (CHANTIX) 1 MG tablet One half tab every morning Patient not taking: Reported on 03/23/2018 04/08/13   Roderick Peeodd, Jeffrey A, MD    Family History Family History  Problem Relation Age of Onset  . Cancer Mother        colon  . Heart disease Father        MI  . Cancer Father        prostate  . Cancer Brother        lung    Social History Social History   Tobacco Use  . Smoking status: Current Every Day Smoker    Packs/day: 1.00    Types: Cigarettes  . Smokeless tobacco: Never Used  Substance Use Topics  . Alcohol use: Yes  . Drug use: No     Allergies   Patient has no known allergies.   Review of Systems  Review of Systems  Constitutional: Negative for chills and fever.  HENT: Negative for ear pain and sore throat.   Eyes: Negative for visual disturbance.  Respiratory: Positive for cough and shortness of breath.   Cardiovascular: Negative for chest pain and palpitations.  Gastrointestinal: Negative for abdominal pain and vomiting.  Genitourinary: Negative for dysuria and hematuria.  Musculoskeletal: Positive for arthralgias. Negative for back pain and neck pain.  Skin: Positive for wound. Negative for color change and rash.  Neurological: Positive for syncope (possible). Negative for seizures and headaches.  All other systems reviewed and are negative.    Physical Exam Updated Vital Signs BP 118/83   Pulse 90   Temp 98.2 F (36.8 C) (Oral)   Resp 18   SpO2 (!) 89%   Physical Exam    Constitutional: He appears well-developed.  Smells of EtOH  HENT:  Head: Normocephalic and atraumatic.  Mouth/Throat: Oropharynx is clear and moist.  Campbell in place  Eyes: Conjunctivae and EOM are normal.  Neck: Neck supple.  No midline cervical spine tenderness, no step offs or deformities  Cardiovascular: Normal rate, regular rhythm, normal heart sounds and intact distal pulses. Exam reveals no friction rub.  No murmur heard. Pulmonary/Chest: Effort normal. No tachypnea. No respiratory distress. He has wheezes (expiratory, throughout). He has rhonchi (diffuse).  Abdominal: Soft. He exhibits no distension. There is no tenderness.  Musculoskeletal: He exhibits tenderness. He exhibits no edema or deformity.  RUE: scattered ecchymosis with superficial abrasion at elbow with underlying tenderness, no deformities, NVI distally   Neurological: He is alert. No cranial nerve deficit or sensory deficit.  Skin: Skin is warm and dry. Ecchymosis noted.  Scattered ecchymosis to BUE  Psychiatric: He has a normal mood and affect. His speech is slurred.  Nursing note and vitals reviewed.    ED Treatments / Results  Labs (all labs ordered are listed, but only abnormal results are displayed) Labs Reviewed  CBC WITH DIFFERENTIAL/PLATELET - Abnormal; Notable for the following components:      Result Value   RBC 3.79 (*)    Hemoglobin 12.9 (*)    MCV 103.4 (*)    All other components within normal limits  BASIC METABOLIC PANEL - Abnormal; Notable for the following components:   Chloride 99 (*)    BUN <5 (*)    All other components within normal limits  I-STAT TROPONIN, ED    EKG EKG Interpretation  Date/Time:  Monday March 23 2018 16:33:05 EDT Ventricular Rate:  91 PR Interval:  130 QRS Duration: 106 QT Interval:  362 QTC Calculation: 445 R Axis:   87 Text Interpretation:  Normal sinus rhythm with sinus arrhythmia Possible Left atrial enlargement Incomplete right bundle branch block No  previous tracing Confirmed by Cathren Laine ([id number]) on 03/23/2018 5:12:02 PM   Radiology Dg Chest 2 View  Result Date: 03/23/2018 CLINICAL DATA:  Worsening shortness of breath after falling off a tractor today. EXAM: CHEST - 2 VIEW COMPARISON:  None. FINDINGS: The heart size and mediastinal contours are within normal limits. Normal pulmonary vascularity. Mild diffuse interstitial prominence and central peribronchial thickening, likely smoking-related. No focal consolidation, pleural effusion, or pneumothorax. No acute osseous abnormality. IMPRESSION: 1.  No active cardiopulmonary disease. 2. Bronchitic changes, likely smoking-related. Electronically Signed   By: Obie Dredge M.D.   On: 03/23/2018 16:57   Dg Elbow 2 Views Right  Result Date: 03/23/2018 CLINICAL DATA:  Right elbow pain after fall. EXAM: RIGHT ELBOW - 2 VIEW COMPARISON:  None. FINDINGS: There is no evidence of fracture, dislocation, or joint effusion. There is no evidence of arthropathy or other focal bone abnormality. Soft tissues are unremarkable. IMPRESSION: Normal right elbow. Electronically Signed   By: Lupita Raider, M.D.   On: 03/23/2018 18:23   Ct Head Wo Contrast  Result Date: 03/23/2018 CLINICAL DATA:  Dizzy for 4 days, syncopal episode and fall. Fell off tractor. ETOH is reported. EXAM: CT HEAD WITHOUT CONTRAST CT CERVICAL SPINE WITHOUT CONTRAST TECHNIQUE: Multidetector CT imaging of the head and cervical spine was performed following the standard protocol without intravenous contrast. Multiplanar CT image reconstructions of the cervical spine were also generated. COMPARISON:  None. FINDINGS: CT HEAD FINDINGS Brain: No evidence for acute infarction, hemorrhage, mass lesion, hydrocephalus, or extra-axial fluid. Mild atrophy. No definite white matter disease. Vascular: Calcification of the cavernous internal carotid arteries consistent with cerebrovascular atherosclerotic disease. No signs of intracranial large vessel  occlusion. Skull: Calvarium intact. Sinuses/Orbits: Clear sinuses.  Negative orbits. Other: None. CT CERVICAL SPINE FINDINGS Alignment: Reversal of the normal cervical lordotic curve. No posttraumatic subluxation. Skull base and vertebrae: No acute fracture. No primary bone lesion or focal pathologic process. Soft tissues and spinal canal: No prevertebral fluid or swelling. No visible canal hematoma. Carotid atherosclerosis. Disc levels:  Disc space narrowing most pronounced C4-C7. Upper chest: No mass or hydronephrosis. Other: None. IMPRESSION: No skull fracture or intracranial hemorrhage.  Mild atrophy. Spondylosis.  No cervical spine fracture or traumatic subluxation. Electronically Signed   By: Elsie Stain M.D.   On: 03/23/2018 18:20   Ct Cervical Spine Wo Contrast  Result Date: 03/23/2018 CLINICAL DATA:  Dizzy for 4 days, syncopal episode and fall. Fell off tractor. ETOH is reported. EXAM: CT HEAD WITHOUT CONTRAST CT CERVICAL SPINE WITHOUT CONTRAST TECHNIQUE: Multidetector CT imaging of the head and cervical spine was performed following the standard protocol without intravenous contrast. Multiplanar CT image reconstructions of the cervical spine were also generated. COMPARISON:  None. FINDINGS: CT HEAD FINDINGS Brain: No evidence for acute infarction, hemorrhage, mass lesion, hydrocephalus, or extra-axial fluid. Mild atrophy. No definite white matter disease. Vascular: Calcification of the cavernous internal carotid arteries consistent with cerebrovascular atherosclerotic disease. No signs of intracranial large vessel occlusion. Skull: Calvarium intact. Sinuses/Orbits: Clear sinuses.  Negative orbits. Other: None. CT CERVICAL SPINE FINDINGS Alignment: Reversal of the normal cervical lordotic curve. No posttraumatic subluxation. Skull base and vertebrae: No acute fracture. No primary bone lesion or focal pathologic process. Soft tissues and spinal canal: No prevertebral fluid or swelling. No visible canal  hematoma. Carotid atherosclerosis. Disc levels:  Disc space narrowing most pronounced C4-C7. Upper chest: No mass or hydronephrosis. Other: None. IMPRESSION: No skull fracture or intracranial hemorrhage.  Mild atrophy. Spondylosis.  No cervical spine fracture or traumatic subluxation. Electronically Signed   By: Elsie Stain M.D.   On: 03/23/2018 18:20    Procedures Procedures (including critical care time)  Medications Ordered in ED Medications  ipratropium-albuterol (DUONEB) 0.5-2.5 (3) MG/3ML nebulizer solution 3 mL (3 mLs Nebulization Given 03/23/18 1718)  Tdap (BOOSTRIX) injection 0.5 mL (0.5 mLs Intramuscular Given 03/23/18 1716)  methylPREDNISolone sodium succinate (SOLU-MEDROL) 125 mg/2 mL injection 125 mg (125 mg Intravenous Given 03/23/18 1736)  albuterol (PROVENTIL HFA;VENTOLIN HFA) 108 (90 Base) MCG/ACT inhaler 2 puff (2 puffs Inhalation Given 03/23/18 1850)     Initial Impression / Assessment and Plan / ED Course  I have reviewed the triage vital signs and the nursing notes.  Pertinent labs & imaging results  that were available during my care of the patient were reviewed by me and considered in my medical decision making (see chart for details).     Blayde Bacigalupi is a 62 y.o. male with PMHx of COPD, EtOH use who p/w dyspnea, found by neighbor unconsciousness next tractor. Reviewed and confirmed nursing documentation for past medical history, family history, social history. VS afebrile, 94% on 2L [place] (baseline), BP wnl. Exam remarkable for clinically intoxicated, no focal deficits, rhonchi/wheezes throughout. Currently being treated as outpt with steroids, abx. In regards to loss of consciousness, patient admits to falling near his tractor. Will f/o TBI given intoxicated and reported decreased LOC. Patient's only complaint is of dyspnea, though per daughter, appears to be at baseline. Question acute COPD exacerbation, PNA.   Wheezing improved with duoneb. IV solumedrol given. Tdap  updated. EKG with no ischemic changes. Trop neg. CBC with no leukocytosis, stable macrocytic anemia with hgb 12.9 (likely 2/2 EtOH use), BMP wnl. R elbow xray wnl. CT head with no acute traumatic injury. CT cervical spine with no acute fracture. CXR neg. Albuterol inhaler ordered for home use, instructed to do scheduled treatments q4 hours.  Old records reviewed. Labs reviewed by me and used in the medical decision making.  Imaging viewed and interpreted by me and used in the medical decision making (formal interpretation from radiologist). EKG reviewed by me and used in the medical decision making. D/c home in stable condition, return precautions discussed. Patient, pt's daughter agreeable with plan for d/c home.  Final Clinical Impressions(s) / ED Diagnoses   Final diagnoses:  Fall, initial encounter  COPD exacerbation [facility])    ED Discharge Orders    None       Diannia Ruder, MD 03/23/18 2357    Cathren Laine, MD 03/26/18 [phone]

## 2018-03-23 NOTE — ED Notes (Signed)
Pt OTF to CT & XR

## 2018-03-23 NOTE — Discharge Instructions (Addendum)
Please use your albuterol inhaler every 4 hours at home for the next couple of days. Continued your steroids and antibiotics.

## 2018-03-23 NOTE — ED Triage Notes (Addendum)
Pt BIB EMS d/t Fall off of tractor today and Hit R. Arm on tractor blade. Pt has had worsening  SOB. Pt temporarily LOC. Pt has been drinking ETOH w/ Hx of COPD, ETOH abuse. Pt denies hitting head, N/V.

## 2018-03-23 NOTE — ED Notes (Signed)
Patient returned from X RAY with no distress

## 2018-05-23 ENCOUNTER — Other Ambulatory Visit (HOSPITAL_COMMUNITY): Payer: Self-pay | Admitting: Orthopedic Surgery

## 2018-05-23 ENCOUNTER — Emergency Department (HOSPITAL_COMMUNITY): Payer: BLUE CROSS/BLUE SHIELD

## 2018-05-23 ENCOUNTER — Encounter (HOSPITAL_COMMUNITY): Payer: Self-pay | Admitting: Anesthesiology

## 2018-05-23 ENCOUNTER — Encounter (HOSPITAL_COMMUNITY): Payer: Self-pay

## 2018-05-23 ENCOUNTER — Encounter (HOSPITAL_COMMUNITY)
Admission: EM | Disposition: A | Discharge status: Home / Self Care | Payer: Self-pay | Source: Home / Self Care | Attending: Family Medicine

## 2018-05-23 ENCOUNTER — Other Ambulatory Visit (HOSPITAL_COMMUNITY): Payer: BLUE CROSS/BLUE SHIELD

## 2018-05-23 ENCOUNTER — Inpatient Hospital Stay (HOSPITAL_COMMUNITY)
Admission: EM | Admit: 2018-05-23 | Discharge: 2018-05-27 | DRG: 470 | Disposition: A | Discharge status: Home / Self Care | Payer: BLUE CROSS/BLUE SHIELD | Attending: Family Medicine | Admitting: Family Medicine

## 2018-05-23 DIAGNOSIS — Z9849 Cataract extraction status, unspecified eye: Secondary | ICD-10-CM | POA: Diagnosis not present

## 2018-05-23 DIAGNOSIS — I509 Heart failure, unspecified: Secondary | ICD-10-CM | POA: Diagnosis present

## 2018-05-23 DIAGNOSIS — J9611 Chronic respiratory failure with hypoxia: Secondary | ICD-10-CM | POA: Diagnosis present

## 2018-05-23 DIAGNOSIS — R55 Syncope and collapse: Secondary | ICD-10-CM | POA: Diagnosis present

## 2018-05-23 DIAGNOSIS — Z9981 Dependence on supplemental oxygen: Secondary | ICD-10-CM | POA: Diagnosis not present

## 2018-05-23 DIAGNOSIS — J449 Chronic obstructive pulmonary disease, unspecified: Secondary | ICD-10-CM | POA: Diagnosis not present

## 2018-05-23 DIAGNOSIS — Z9889 Other specified postprocedural states: Secondary | ICD-10-CM

## 2018-05-23 DIAGNOSIS — Z8781 Personal history of (healed) traumatic fracture: Secondary | ICD-10-CM

## 2018-05-23 DIAGNOSIS — E871 Hypo-osmolality and hyponatremia: Secondary | ICD-10-CM | POA: Diagnosis present

## 2018-05-23 DIAGNOSIS — S2239XA Fracture of one rib, unspecified side, initial encounter for closed fracture: Secondary | ICD-10-CM

## 2018-05-23 DIAGNOSIS — F10129 Alcohol abuse with intoxication, unspecified: Secondary | ICD-10-CM | POA: Diagnosis not present

## 2018-05-23 DIAGNOSIS — Z79899 Other long term (current) drug therapy: Secondary | ICD-10-CM

## 2018-05-23 DIAGNOSIS — J45909 Unspecified asthma, uncomplicated: Secondary | ICD-10-CM | POA: Diagnosis present

## 2018-05-23 DIAGNOSIS — S72002A Fracture of unspecified part of neck of left femur, initial encounter for closed fracture: Secondary | ICD-10-CM | POA: Diagnosis present

## 2018-05-23 DIAGNOSIS — F17210 Nicotine dependence, cigarettes, uncomplicated: Secondary | ICD-10-CM | POA: Diagnosis present

## 2018-05-23 DIAGNOSIS — F319 Bipolar disorder, unspecified: Secondary | ICD-10-CM | POA: Diagnosis present

## 2018-05-23 DIAGNOSIS — Z791 Long term (current) use of non-steroidal anti-inflammatories (NSAID): Secondary | ICD-10-CM | POA: Diagnosis not present

## 2018-05-23 DIAGNOSIS — D72829 Elevated white blood cell count, unspecified: Secondary | ICD-10-CM | POA: Diagnosis present

## 2018-05-23 DIAGNOSIS — I4510 Unspecified right bundle-branch block: Secondary | ICD-10-CM | POA: Diagnosis present

## 2018-05-23 DIAGNOSIS — J441 Chronic obstructive pulmonary disease with (acute) exacerbation: Secondary | ICD-10-CM | POA: Diagnosis present

## 2018-05-23 DIAGNOSIS — F10229 Alcohol dependence with intoxication, unspecified: Secondary | ICD-10-CM | POA: Diagnosis present

## 2018-05-23 DIAGNOSIS — I951 Orthostatic hypotension: Secondary | ICD-10-CM | POA: Diagnosis not present

## 2018-05-23 DIAGNOSIS — J309 Allergic rhinitis, unspecified: Secondary | ICD-10-CM | POA: Diagnosis present

## 2018-05-23 DIAGNOSIS — S2249XA Multiple fractures of ribs, unspecified side, initial encounter for closed fracture: Secondary | ICD-10-CM

## 2018-05-23 HISTORY — DX: Fracture of unspecified part of neck of left femur, initial encounter for closed fracture: S72.002A

## 2018-05-23 HISTORY — DX: Syncope and collapse: R55

## 2018-05-23 HISTORY — DX: Alcohol abuse with intoxication, unspecified: F10.129

## 2018-05-23 LAB — ETHANOL: Alcohol, Ethyl (B): <10 mg/dL (ref <10)

## 2018-05-23 LAB — BASIC METABOLIC PANEL
Anion gap: 10 (ref 5–15)
BUN: 11 mg/dL (ref 6–20)
CALCIUM: 8.9 mg/dL (ref 8.9–10.3)
CO2: 26 mmol/L (ref 22–32)
CREATININE: 0.54 mg/dL — AB (ref 0.61–1.24)
Chloride: 98 mmol/L (ref 98–111)
GFR calc Af Amer: >60 mL/min (ref >60)
Glucose, Bld: 113 mg/dL — ABNORMAL HIGH (ref 70–99)
Potassium: 4.3 mmol/L (ref 3.5–5.1)
SODIUM: 134 mmol/L — AB (ref 135–145)

## 2018-05-23 LAB — CBC WITH DIFFERENTIAL/PLATELET
Abs Immature Granulocytes: 0.2 10*3/uL — ABNORMAL HIGH (ref 0.0–0.1)
BASOS ABS: 0.1 10*3/uL (ref 0.0–0.1)
BASOS PCT: 1 %
EOS PCT: 0 %
Eosinophils Absolute: 0 10*3/uL (ref 0.0–0.7)
HCT: 44.2 % (ref 39.0–52.0)
Hemoglobin: 14.8 g/dL (ref 13.0–17.0)
Immature Granulocytes: 1 %
Lymphocytes Relative: 6 %
Lymphs Abs: 1 10*3/uL (ref 0.7–4.0)
MCH: 35 pg — AB (ref 26.0–34.0)
MCHC: 33.5 g/dL (ref 30.0–36.0)
MCV: 104.5 fL — AB (ref 78.0–100.0)
MONO ABS: 1.5 10*3/uL — AB (ref 0.1–1.0)
Monocytes Relative: 10 %
Neutro Abs: 12.6 10*3/uL — ABNORMAL HIGH (ref 1.7–7.7)
Neutrophils Relative %: 82 %
PLATELETS: 273 10*3/uL (ref 150–400)
RBC: 4.23 MIL/uL (ref 4.22–5.81)
RDW: 14.4 % (ref 11.5–15.5)
WBC: 15.4 10*3/uL — ABNORMAL HIGH (ref 4.0–10.5)

## 2018-05-23 LAB — PROTIME-INR
INR: 0.95
PROTHROMBIN TIME: 12.6 s (ref 11.4–15.2)

## 2018-05-23 LAB — TYPE AND SCREEN
ABO/RH(D): A POS
Antibody Screen: NEGATIVE

## 2018-05-23 LAB — TROPONIN I
Troponin I: <0.03 ng/mL (ref <0.03)
Troponin I: <0.03 ng/mL (ref <0.03)

## 2018-05-23 LAB — ABO/RH: ABO/RH(D): A POS

## 2018-05-23 LAB — TSH: TSH: 2.061 u[IU]/mL (ref 0.350–4.500)

## 2018-05-23 LAB — BRAIN NATRIURETIC PEPTIDE: B Natriuretic Peptide: 62.1 pg/mL (ref 0.0–100.0)

## 2018-05-23 SURGERY — ARTHROPLASTY, HIP, TOTAL, ANTERIOR APPROACH
Anesthesia: General | Laterality: Left

## 2018-05-23 MED ORDER — PREDNISONE 20 MG PO TABS
40.0000 mg | ORAL_TABLET | Freq: Every day | ORAL | Status: DC
  Administered 2018-05-25 – 2018-05-27 (×3): 40 mg via ORAL
  Filled 2018-05-23 (×4): qty 2

## 2018-05-23 MED ORDER — SODIUM CHLORIDE 0.9 % IV SOLN
INTRAVENOUS | Status: AC
  Administered 2018-05-23: 19:00:00 via INTRAVENOUS

## 2018-05-23 MED ORDER — IPRATROPIUM-ALBUTEROL 0.5-2.5 (3) MG/3ML IN SOLN: 3.0000 mL | Freq: Four times a day (QID) | RESPIRATORY_TRACT | Status: DC | PRN

## 2018-05-23 MED ORDER — ACETAMINOPHEN 650 MG RE SUPP: 650.0000 mg | Freq: Four times a day (QID) | RECTAL | Status: DC | PRN

## 2018-05-23 MED ORDER — ENOXAPARIN SODIUM 40 MG/0.4ML ~~LOC~~ SOLN
40.0000 mg | SUBCUTANEOUS | Status: DC
  Administered 2018-05-23: 40 mg via SUBCUTANEOUS
  Filled 2018-05-23: qty 0.4

## 2018-05-23 MED ORDER — FOLIC ACID 1 MG PO TABS
1.0000 mg | ORAL_TABLET | Freq: Every day | ORAL | Status: DC
  Administered 2018-05-23 – 2018-05-27 (×5): 1 mg via ORAL
  Filled 2018-05-23 (×5): qty 1

## 2018-05-23 MED ORDER — THIAMINE HCL 100 MG/ML IJ SOLN: 100.0000 mg | Freq: Every day | INTRAMUSCULAR | Status: DC

## 2018-05-23 MED ORDER — LORAZEPAM 2 MG/ML IJ SOLN: 1.0000 mg | Freq: Four times a day (QID) | INTRAMUSCULAR | Status: AC | PRN

## 2018-05-23 MED ORDER — VITAMIN B-1 100 MG PO TABS
100.0000 mg | ORAL_TABLET | Freq: Every day | ORAL | Status: DC
  Administered 2018-05-23 – 2018-05-27 (×5): 100 mg via ORAL
  Filled 2018-05-23 (×5): qty 1

## 2018-05-23 MED ORDER — IPRATROPIUM-ALBUTEROL 0.5-2.5 (3) MG/3ML IN SOLN: 3.0000 mL | Freq: Four times a day (QID) | RESPIRATORY_TRACT | Status: DC

## 2018-05-23 MED ORDER — METHYLPREDNISOLONE SODIUM SUCC 40 MG IJ SOLR
40.0000 mg | Freq: Four times a day (QID) | INTRAMUSCULAR | Status: AC
  Administered 2018-05-23 – 2018-05-24 (×4): 40 mg via INTRAVENOUS
  Filled 2018-05-23 (×4): qty 1

## 2018-05-23 MED ORDER — OXYCODONE HCL 5 MG PO TABS
5.0000 mg | ORAL_TABLET | ORAL | Status: DC | PRN
  Administered 2018-05-23 – 2018-05-27 (×14): 5 mg via ORAL
  Filled 2018-05-23 (×14): qty 1

## 2018-05-23 MED ORDER — MONTELUKAST SODIUM 10 MG PO TABS
10.0000 mg | ORAL_TABLET | Freq: Every day | ORAL | Status: DC
  Administered 2018-05-24 – 2018-05-25 (×2): 10 mg via ORAL
  Filled 2018-05-23 (×3): qty 1

## 2018-05-23 MED ORDER — MOMETASONE FURO-FORMOTEROL FUM 200-5 MCG/ACT IN AERO
1.0000 | INHALATION_SPRAY | Freq: Two times a day (BID) | RESPIRATORY_TRACT | Status: DC
  Administered 2018-05-23 – 2018-05-27 (×5): 1 via RESPIRATORY_TRACT
  Filled 2018-05-23 (×3): qty 8.8

## 2018-05-23 MED ORDER — ONDANSETRON HCL 4 MG/2ML IJ SOLN: 4.0000 mg | Freq: Four times a day (QID) | INTRAMUSCULAR | Status: DC | PRN

## 2018-05-23 MED ORDER — ONDANSETRON HCL 4 MG PO TABS: 4.0000 mg | ORAL_TABLET | Freq: Four times a day (QID) | ORAL | Status: DC | PRN

## 2018-05-23 MED ORDER — FENTANYL CITRATE (PF) 100 MCG/2ML IJ SOLN
50.0000 ug | INTRAMUSCULAR | Status: AC | PRN
  Administered 2018-05-23 (×2): 50 ug via INTRAVENOUS
  Filled 2018-05-23 (×2): qty 2

## 2018-05-23 MED ORDER — ACETAMINOPHEN 325 MG PO TABS
650.0000 mg | ORAL_TABLET | Freq: Four times a day (QID) | ORAL | Status: DC | PRN
  Administered 2018-05-26: 650 mg via ORAL
  Filled 2018-05-23: qty 2

## 2018-05-23 MED ORDER — MORPHINE SULFATE (PF) 4 MG/ML IV SOLN
6.0000 mg | INTRAVENOUS | Status: AC | PRN
  Administered 2018-05-23 (×3): 6 mg via INTRAVENOUS
  Filled 2018-05-23 (×3): qty 2

## 2018-05-23 MED ORDER — POLYETHYLENE GLYCOL 3350 17 G PO PACK: 17.0000 g | PACK | Freq: Every day | ORAL | Status: DC | PRN

## 2018-05-23 MED ORDER — CITALOPRAM HYDROBROMIDE 20 MG PO TABS
20.0000 mg | ORAL_TABLET | Freq: Every day | ORAL | Status: DC
  Administered 2018-05-23 – 2018-05-27 (×5): 20 mg via ORAL
  Filled 2018-05-23 (×5): qty 1

## 2018-05-23 MED ORDER — ADULT MULTIVITAMIN W/MINERALS CH
1.0000 | ORAL_TABLET | Freq: Every day | ORAL | Status: DC
  Administered 2018-05-24 – 2018-05-27 (×4): 1 via ORAL
  Filled 2018-05-23 (×4): qty 1

## 2018-05-23 MED ORDER — LORAZEPAM 1 MG PO TABS
1.0000 mg | ORAL_TABLET | Freq: Four times a day (QID) | ORAL | Status: AC | PRN
  Administered 2018-05-24 – 2018-05-26 (×3): 1 mg via ORAL
  Filled 2018-05-23 (×3): qty 1

## 2018-05-23 NOTE — ED Provider Notes (Signed)
[facility] EMERGENCY DEPARTMENT Provider Note   CSN: [id number] Arrival date & time: 05/23/18  0913     History   Chief Complaint Chief Complaint  Patient presents with  . Hip Pain    Left    HPI Joel Simmons is a 62 y.o. male.  HPI  62 year old male with history of CHF, COPD comes in with chief complaint of fall.  Patient reports that he got out of his truck and the next thing he recalls he was on the floor.  Patient is unsure why he fell down and does not remember falling.  Patient was unable to get up after the fall because of pain in his hip.  Patient is complaining of mild right-sided chest discomfort, left-sided hip pain.  The patient is not on any blood thinners.  Pt admits to alcohol on board  Past Medical History:  Diagnosis Date  . ALLERGIC RHINITIS 03/27/2010   Qualifier: Diagnosis of  By: Thurmon Fair CMA (AAMA), Fleet Contras    . ASTHMA 03/27/2010   Qualifier: Diagnosis of  By: Tawanna Cooler MD, Eugenio Hoes   . Bipolar 1 disorder (HCC)   . CHF (congestive heart failure) (HCC)   . COPD (chronic obstructive pulmonary disease) (HCC)   . DEPRESSIVE DISORDER 03/27/2010   Qualifier: Diagnosis of  By: Tawanna Cooler MD, Eugenio Hoes TOBACCO USE 03/27/2010   Qualifier: Diagnosis of  By: Tawanna Cooler MD, Eugenio Hoes TOBACCO USE 03/27/2010   Qualifier: Diagnosis of  By: Tawanna Cooler MD, Eugenio Hoes     Patient Active Problem List   Diagnosis Date Noted  . Other malaise and fatigue 04/08/2013  . TOBACCO USE 03/27/2010  . DEPRESSIVE DISORDER 03/27/2010  . ALLERGIC RHINITIS 03/27/2010  . ASTHMA 03/27/2010    Past Surgical History:  Procedure Laterality Date  . CATARACT EXTRACTION          Home Medications    Prior to Admission medications   Medication Sig Start Date End Date Taking? Authorizing Provider  albuterol (PROVENTIL HFA;VENTOLIN HFA) 108 (90 Base) MCG/ACT inhaler Inhale 2 puffs into the lungs every 4 (four) hours as needed for wheezing or shortness of breath.   Yes [provider]  citalopram (CELEXA) 20 MG tablet Take 1 tablet (20 mg total) by mouth daily. 08/16/13  Yes Roderick Pee, MD  naproxen sodium (ALEVE) 220 MG tablet Take 440 mg by mouth as needed (pain).   Yes [provider]  OXYGEN Inhale 2 L into the lungs.   Yes [provider]  chlordiazePOXIDE (LIBRIUM) 10 MG capsule Take 1 capsule (10 mg total) by mouth 3 (three) times daily as needed for anxiety. Patient not taking: Reported on 03/23/2018 04/08/13   Roderick Pee, MD  montelukast (SINGULAIR) 10 MG tablet Take 10 mg by mouth daily. 01/15/18   [provider]  varenicline (CHANTIX) 1 MG tablet One half tab every morning Patient not taking: Reported on 03/23/2018 04/08/13   Roderick Pee, MD    Family History Family History  Problem Relation Age of Onset  . Cancer Mother        colon  . Heart disease Father        MI  . Cancer Father        prostate  . Cancer Brother        lung    Social History Social History   Tobacco Use  . Smoking status: Current Every Day Smoker    Packs/day: 1.00  Types: Cigarettes  . Smokeless tobacco: Never Used  Substance Use Topics  . Alcohol use: Yes  . Drug use: No     Allergies   Patient has no known allergies.   Review of Systems Review of Systems  Constitutional: Positive for activity change.  Respiratory: Negative for shortness of breath.   Cardiovascular: Negative for chest pain.  Musculoskeletal: Positive for arthralgias.  Allergic/Immunologic: Negative for immunocompromised state.  Neurological: Negative for headaches.  Hematological: Does not bruise/bleed easily.  All other systems reviewed and are negative.    Physical Exam Updated Vital Signs BP (!) 182/90   Pulse 97   Temp 98.6 F (37 C) (Oral)   Resp (!) 24   SpO2 91%   Physical Exam  Constitutional: He is oriented to person, place, and time. He appears well-developed.  HENT:  Head: Atraumatic.  Neck: Neck supple.    Cardiovascular: Normal rate and intact distal pulses.  Pulmonary/Chest: Effort normal.  Musculoskeletal:  Patient has tenderness over the left hip.  Patient's left lower extremity appears to be slightly shortened and externally rotated.  Otherwise:   Head to toe evaluation shows no hematoma, bleeding of the scalp, no facial abrasions, no spine step offs, crepitus of the chest or neck, no tenderness to palpation of the bilateral upper and lower extremities, no gross deformities, no chest tenderness, no pelvic pain.   Neurological: He is alert and oriented to person, place, and time. No cranial nerve deficit or sensory deficit.  Skin: Skin is warm.  Nursing note and vitals reviewed.    ED Treatments / Results  Labs (all labs ordered are listed, but only abnormal results are displayed) Labs Reviewed  BASIC METABOLIC PANEL - Abnormal; Notable for the following components:      Result Value   Sodium 134 (*)    Glucose, Bld 113 (*)    Creatinine, Ser 0.54 (*)    All other components within normal limits  CBC WITH DIFFERENTIAL/PLATELET - Abnormal; Notable for the following components:   WBC 15.4 (*)    MCV 104.5 (*)    MCH 35.0 (*)    Neutro Abs 12.6 (*)    Monocytes Absolute 1.5 (*)    Abs Immature Granulocytes 0.2 (*)    All other components within normal limits  PROTIME-INR  ETHANOL  TYPE AND SCREEN  ABO/RH    EKG None  Radiology Dg Chest 1 View  Result Date: 05/23/2018 CLINICAL DATA:  Recent fall, left hip fracture EXAM: CHEST  1 VIEW COMPARISON:  05/23/2018, 03/23/2018 FINDINGS: The heart size and mediastinal contours are within normal limits. Both lungs are clear. The visualized skeletal structures are unremarkable. IMPRESSION: No active disease. Electronically Signed   By: Judie PetitM.  Shick M.D.   On: 05/23/2018 10:04   Ct Head Wo Contrast  Result Date: 05/23/2018 CLINICAL DATA:  62 year old male with acute head and neck injury EXAM: CT HEAD WITHOUT CONTRAST CT CERVICAL  SPINE WITHOUT CONTRAST TECHNIQUE: Multidetector CT imaging of the head and cervical spine was performed following the standard protocol without intravenous contrast. Multiplanar CT image reconstructions of the cervical spine were also generated. COMPARISON:  03/23/2018 CT FINDINGS: CT HEAD FINDINGS Brain: No evidence of acute infarction, hemorrhage, hydrocephalus, extra-axial collection or mass lesion/mass effect. Minimal generalized cerebral volume loss again noted. Vascular: Carotid atherosclerotic calcifications again noted. Skull: Normal. Negative for fracture or focal lesion. Sinuses/Orbits: No acute finding. Other: None. CT CERVICAL SPINE FINDINGS Alignment: Normal. Skull base and vertebrae: No acute fracture. No primary  bone lesion or focal pathologic process. Soft tissues and spinal canal: No prevertebral fluid or swelling. No visible canal hematoma. Disc levels: Moderate degenerative disc disease/spondylosis from C3-C7 again noted. Upper chest: Negative. Other: None IMPRESSION: 1. No evidence of acute intracranial abnormality. 2. No static evidence of acute injury to the cervical spine. 3. Moderate degenerative disc disease/spondylosis from C3-C7. Electronically Signed   By: Harmon Pier M.D.   On: 05/23/2018 11:19   Ct Cervical Spine Wo Contrast  Result Date: 05/23/2018 CLINICAL DATA:  62 year old male with acute head and neck injury EXAM: CT HEAD WITHOUT CONTRAST CT CERVICAL SPINE WITHOUT CONTRAST TECHNIQUE: Multidetector CT imaging of the head and cervical spine was performed following the standard protocol without intravenous contrast. Multiplanar CT image reconstructions of the cervical spine were also generated. COMPARISON:  03/23/2018 CT FINDINGS: CT HEAD FINDINGS Brain: No evidence of acute infarction, hemorrhage, hydrocephalus, extra-axial collection or mass lesion/mass effect. Minimal generalized cerebral volume loss again noted. Vascular: Carotid atherosclerotic calcifications again noted.  Skull: Normal. Negative for fracture or focal lesion. Sinuses/Orbits: No acute finding. Other: None. CT CERVICAL SPINE FINDINGS Alignment: Normal. Skull base and vertebrae: No acute fracture. No primary bone lesion or focal pathologic process. Soft tissues and spinal canal: No prevertebral fluid or swelling. No visible canal hematoma. Disc levels: Moderate degenerative disc disease/spondylosis from C3-C7 again noted. Upper chest: Negative. Other: None IMPRESSION: 1. No evidence of acute intracranial abnormality. 2. No static evidence of acute injury to the cervical spine. 3. Moderate degenerative disc disease/spondylosis from C3-C7. Electronically Signed   By: Harmon Pier M.D.   On: 05/23/2018 11:19   Dg Hip Unilat With Pelvis 2-3 Views Left  Result Date: 05/23/2018 CLINICAL DATA:  Left hip pain after fall. EXAM: DG HIP (WITH OR WITHOUT PELVIS) 2-3V LEFT COMPARISON:  None. FINDINGS: Acute, impacted fracture of the left femoral neck. No dislocation. The hip joint spaces are relatively preserved. Soft tissues are unremarkable. IMPRESSION: Acute left femoral neck fracture. Electronically Signed   By: Obie Dredge M.D.   On: 05/23/2018 10:04    Procedures Procedures (including critical care time)  Medications Ordered in ED Medications  fentaNYL (SUBLIMAZE) injection 50 mcg (50 mcg Intravenous Given 05/23/18 1101)  morphine 4 MG/ML injection 6 mg (has no administration in time range)     Initial Impression / Assessment and Plan / ED Course  I have reviewed the triage vital signs and the nursing notes.  Pertinent labs & imaging results that were available during my care of the patient were reviewed by me and considered in my medical decision making (see chart for details).  Clinical Course as of May 23 1136  Sat May 23, 2018  1136 Hospitalist to call Dr. Linna Caprice once he sees the patient and clear him medically. Cell phone number provided.   [AN]  1137 Results from the ER workup discussed with  the patient face to face and all questions answered to the best of my ability.   DG Hip Unilat With Pelvis 2-3 Views Left [AN]    Clinical Course User Index [AN] Derwood Kaplan, MD    62 year old male comes in with chief complaint of fall. Patient is noted to have left sided lower extremity injury along with right-sided chest pain.  He has alcohol on board and does not recall the event of falling.  He could have concussion.  Given that there is alcohol on board we cannot clear his brain and C-spine clinically.  Final Clinical Impressions(s) / ED Diagnoses  Final diagnoses:  Closed fracture of left hip, initial encounter [facility])    ED Discharge Orders    None       Derwood KaplanNanavati, Lakendria Nicastro, MD 05/23/18 1137

## 2018-05-23 NOTE — ED Triage Notes (Signed)
Pt arrived via [facility]; pt from [facility] with c/o fall with unknown etiology on yesterday, possible ETOH per EMS; pt unable to bear weight on left leg; level ground fall; family states pt loss consciousness; 170/94; 92% on 2L, 16, 90; Hx COPD, CHF; rhonchi in all fields per EMS

## 2018-05-23 NOTE — Consult Note (Signed)
ELECTROPHYSIOLOGY CONSULT NOTE  Patient ID: Joel Simmons, MRN: [id number], DOB/AGE: [birth year] 62 y.o. Admit date: 05/23/2018 Date of Consult: 05/23/2018  Primary Physician: Roderick Pee, MD Primary Cardiologist: Weyman Bogdon is a 62 y.o. male who is being seen today for the evaluation of syncope at the request of Dr Clearnce Sorrel.    HPI Joel Simmons is a 62 y.o. male with a past medical history of oxygen dependent COPD, bipolar disorder, chronic alcohol use who fell without warning and broke his hip.  He has a complicated history with alcohol which prev ER notes say that he denies using even while he presented in June intoxicated.  There is clearly a level of tension between him and his wife regarding his drinking.  On both occasions of loss of consciousness which happened relatively early in the day he had been drinking heavily.  On both occasions he was outside.  The first occasion occurred in June.  He was working on a tractor part bent over lost consciousness.  He was found unresponsive on the ground.  Brought to the emergency room, orthostatic vital signs were not done.  This occasion he was driving home from his workshop prompted by dyspnea.  He has oxygen at home.  He got out of his truck to go to the mailbox about 20 feet away and lost consciousness.  He was again found unresponsive by neighbors.  Helped to the house.  His wife arrived about 10-15 minutes later found to be pale, diaphoretic profusely which persisted for about an hour as well as nauseated.  While on the pulse ox, she noted his oxygen saturations were stable in the high 80s-low 90s.  Heart rate was also mostly in the 80s-90s but on one occasion it went up to the 130-40 range.  He denies tachypalpitations.  He has a history of orthostatic intolerance.  He denies prior syncope.  He has significant COPD managed by his PA in [place].  He has never seen pulmonary.  Denies chest pain but is limited  significantly by his dyspnea.  No peripheral edema.   Past Medical History:  Diagnosis Date  . Alcohol abuse with intoxication (HCC) 05/23/2018  . ALLERGIC RHINITIS 03/27/2010   Qualifier: Diagnosis of  By: Thurmon Fair CMA (AAMA), Fleet Contras    . ASTHMA 03/27/2010   Qualifier: Diagnosis of  By: Tawanna Cooler MD, Eugenio Hoes   . Bipolar 1 disorder (HCC)   . CHF (congestive heart failure) (HCC)   . Closed left hip fracture, initial encounter (HCC) 05/23/2018  . COPD (chronic obstructive pulmonary disease) (HCC)   . DEPRESSIVE DISORDER 03/27/2010   Qualifier: Diagnosis of  By: Tawanna Cooler MD, Eugenio Hoes   . Syncope 05/23/2018  . TOBACCO USE 03/27/2010   Qualifier: Diagnosis of  By: Tawanna Cooler MD, Eugenio Hoes TOBACCO USE 03/27/2010   Qualifier: Diagnosis of  By: Tawanna Cooler MD, Eugenio Hoes       Surgical History:  Past Surgical History:  Procedure Laterality Date  . CATARACT EXTRACTION       Home Meds: Prior to Admission medications   Medication Sig Start Date End Date Taking? Authorizing Provider  albuterol (PROVENTIL HFA;VENTOLIN HFA) 108 (90 Base) MCG/ACT inhaler Inhale 2 puffs into the lungs every 4 (four) hours as needed for wheezing or shortness of breath.   Yes [provider]  citalopram (CELEXA) 20 MG tablet Take 1 tablet (20 mg total) by mouth daily. 08/16/13  Yes Roderick Pee,  MD  naproxen sodium (ALEVE) 220 MG tablet Take 440 mg by mouth as needed (pain).   Yes [provider]  OXYGEN Inhale 2 L into the lungs.   Yes [provider]  chlordiazePOXIDE (LIBRIUM) 10 MG capsule Take 1 capsule (10 mg total) by mouth 3 (three) times daily as needed for anxiety. Patient not taking: Reported on 03/23/2018 04/08/13   Roderick Peeodd, Jeffrey A, MD  montelukast (SINGULAIR) 10 MG tablet Take 10 mg by mouth daily. 01/15/18   [provider]  varenicline (CHANTIX) 1 MG tablet One half tab every morning Patient not taking: Reported on 03/23/2018 04/08/13   Roderick Peeodd, Jeffrey A, MD    Allergies: No Known  Allergies  Social History   Socioeconomic History  . Marital status: Married    Spouse name: Not on file  . Number of children: Not on file  . Years of education: Not on file  . Highest education level: Not on file  Occupational History  . Not on file  Social Needs  . Financial resource strain: Not on file  . Food insecurity:    Worry: Not on file    Inability: Not on file  . Transportation needs:    Medical: Not on file    Non-medical: Not on file  Tobacco Use  . Smoking status: Current Every Day Smoker    Packs/day: 1.00    Types: Cigarettes  . Smokeless tobacco: Never Used  Substance and Sexual Activity  . Alcohol use: Yes  . Drug use: No  . Sexual activity: Not on file  Lifestyle  . Physical activity:    Days per week: Not on file    Minutes per session: Not on file  . Stress: Not on file  Relationships  . Social connections:    Talks on phone: Not on file    Gets together: Not on file    Attends religious service: Not on file    Active member of club or organization: Not on file    Attends meetings of clubs or organizations: Not on file    Relationship status: Not on file  . Intimate partner violence:    Fear of current or ex partner: Not on file    Emotionally abused: Not on file    Physically abused: Not on file    Forced sexual activity: Not on file  Other Topics Concern  . Not on file  Social History Narrative  . Not on file     Family History  Problem Relation Age of Onset  . Cancer Mother        colon  . Heart disease Father        MI  . Cancer Father        prostate  . Cancer Brother        lung     ROS:  Please see the history of present illness.     All other systems reviewed and negative.    Physical Exam:  Blood pressure (!) 122/91, pulse 85, temperature 98.6 F (37 C), temperature source Oral, resp. rate 17, SpO2 92 %. General: Well developed, well nourished male wearing O2 in no acute distress. Head: Normocephalic, atraumatic,  sclera non-icteric, no xanthomas, nares are without discharge. EENT: normal  Lymph Nodes:  none Neck: Negative for carotid bruits. JVD not elevated. Back:without scoliosis kyphosis Lungs: crackles and rales  Heart: Irregular rate RRR with S1 S2. No  murmur . No rubs, or gallops appreciated. Abdomen: Soft, non-tender, non-distended  with normoactive bowel sounds. No hepatomegaly. No rebound/guarding. No obvious abdominal masses. Msk:  Strength and tone appear normal for age. Extremities: No clubbing or cyanosis. No edema.  Distal pedal pulses are 2+ and equal bilaterally. Skin: Warm and Dry Neuro: Alert and oriented X 3. CN III-XII intact Grossly normal sensory and motor function . Psych:  Responds to questions appropriately with a normal affect.      Labs: Cardiac Enzymes Recent Labs    05/23/18 1216  TROPONINI <0.03   CBC Lab Results  Component Value Date   WBC 15.4 (H) 05/23/2018   HGB 14.8 05/23/2018   HCT 44.2 05/23/2018   MCV 104.5 (H) 05/23/2018   PLT 273 05/23/2018   PROTIME: Recent Labs    05/23/18 0927  LABPROT 12.6  INR 0.95   Chemistry  Recent Labs  Lab 05/23/18 0927  NA 134*  K 4.3  CL 98  CO2 26  BUN 11  CREATININE 0.54*  CALCIUM 8.9  GLUCOSE 113*   Lipids Lab Results  Component Value Date   CHOL 163 03/27/2010   HDL 62.00 03/27/2010   LDLCALC 78 03/27/2010   TRIG 114.0 03/27/2010   BNP No results found for: PROBNP Thyroid Function Tests: No results for input(s): TSH, T4TOTAL, T3FREE, THYROIDAB in the last 72 hours.  Invalid input(s): FREET3 Miscellaneous No results found for: DDIMER  Radiology/Studies:  Dg Chest 1 View  Result Date: 05/23/2018 CLINICAL DATA:  Recent fall, left hip fracture EXAM: CHEST  1 VIEW COMPARISON:  05/23/2018, 03/23/2018 FINDINGS: The heart size and mediastinal contours are within normal limits. Both lungs are clear. The visualized skeletal structures are unremarkable. IMPRESSION: No active disease.  Electronically Signed   By: Judie Petit.  Shick M.D.   On: 05/23/2018 10:04   Ct Head Wo Contrast  Result Date: 05/23/2018 CLINICAL DATA:  62 year old male with acute head and neck injury EXAM: CT HEAD WITHOUT CONTRAST CT CERVICAL SPINE WITHOUT CONTRAST TECHNIQUE: Multidetector CT imaging of the head and cervical spine was performed following the standard protocol without intravenous contrast. Multiplanar CT image reconstructions of the cervical spine were also generated. COMPARISON:  03/23/2018 CT FINDINGS: CT HEAD FINDINGS Brain: No evidence of acute infarction, hemorrhage, hydrocephalus, extra-axial collection or mass lesion/mass effect. Minimal generalized cerebral volume loss again noted. Vascular: Carotid atherosclerotic calcifications again noted. Skull: Normal. Negative for fracture or focal lesion. Sinuses/Orbits: No acute finding. Other: None. CT CERVICAL SPINE FINDINGS Alignment: Normal. Skull base and vertebrae: No acute fracture. No primary bone lesion or focal pathologic process. Soft tissues and spinal canal: No prevertebral fluid or swelling. No visible canal hematoma. Disc levels: Moderate degenerative disc disease/spondylosis from C3-C7 again noted. Upper chest: Negative. Other: None IMPRESSION: 1. No evidence of acute intracranial abnormality. 2. No static evidence of acute injury to the cervical spine. 3. Moderate degenerative disc disease/spondylosis from C3-C7. Electronically Signed   By: Harmon Pier M.D.   On: 05/23/2018 11:19   Ct Cervical Spine Wo Contrast  Result Date: 05/23/2018 CLINICAL DATA:  62 year old male with acute head and neck injury EXAM: CT HEAD WITHOUT CONTRAST CT CERVICAL SPINE WITHOUT CONTRAST TECHNIQUE: Multidetector CT imaging of the head and cervical spine was performed following the standard protocol without intravenous contrast. Multiplanar CT image reconstructions of the cervical spine were also generated. COMPARISON:  03/23/2018 CT FINDINGS: CT HEAD FINDINGS Brain: No  evidence of acute infarction, hemorrhage, hydrocephalus, extra-axial collection or mass lesion/mass effect. Minimal generalized cerebral volume loss again noted. Vascular: Carotid atherosclerotic calcifications again noted.  Skull: Normal. Negative for fracture or focal lesion. Sinuses/Orbits: No acute finding. Other: None. CT CERVICAL SPINE FINDINGS Alignment: Normal. Skull base and vertebrae: No acute fracture. No primary bone lesion or focal pathologic process. Soft tissues and spinal canal: No prevertebral fluid or swelling. No visible canal hematoma. Disc levels: Moderate degenerative disc disease/spondylosis from C3-C7 again noted. Upper chest: Negative. Other: None IMPRESSION: 1. No evidence of acute intracranial abnormality. 2. No static evidence of acute injury to the cervical spine. 3. Moderate degenerative disc disease/spondylosis from C3-C7. Electronically Signed   By: Harmon PierJeffrey  Hu M.D.   On: 05/23/2018 11:19   Dg Knee Left Port  Result Date: 05/23/2018 CLINICAL DATA:  Left femoral neck fracture. EXAM: PORTABLE LEFT KNEE - 1-2 VIEW COMPARISON:  None. FINDINGS: No evidence of fracture, dislocation, or joint effusion. No evidence of arthropathy or other focal bone abnormality. Vascular calcifications. Soft tissues are unremarkable. IMPRESSION: Negative. Electronically Signed   By: Obie DredgeWilliam T Derry M.D.   On: 05/23/2018 11:54   Dg Hip Unilat With Pelvis 2-3 Views Left  Result Date: 05/23/2018 CLINICAL DATA:  Left hip pain after fall. EXAM: DG HIP (WITH OR WITHOUT PELVIS) 2-3V LEFT COMPARISON:  None. FINDINGS: Acute, impacted fracture of the left femoral neck. No dislocation. The hip joint spaces are relatively preserved. Soft tissues are unremarkable. IMPRESSION: Acute left femoral neck fracture. Electronically Signed   By: Obie DredgeWilliam T Derry M.D.   On: 05/23/2018 10:04    EKG: sinus with RBBB duration 110-120 msec   Assessment and Plan:   Hip fracture  Syncope x2  Incomplete right bundle  branch block  Orthostatic intolerance  COPD-oxygen dependent  Alcohol abuse  Cigarette abuse   The patient had syncope with a fractured hip.  The prolonged sequelae of his 2 syncopal episodes, the association with diaphoresis nausea and pallor strongly suggest that the effector limb was neurally mediated.  The trigger could have been heat, intoxication, or orthostatic stress given his history of orthostatic hypotension.  Will be sometime before we can measure orthostatic vital signs.  Interestingly, while his wife had him on the oxygen saturation monitor, she noted his heart rate was over 100.  This raises the possibility of SVT as a potential trigger.  His right incomplete right bundle branch block is also some concern.  Based on the ISSUE 3 trial QRS duration is longer than 100 ms were associated with a significantly higher risk of heart block or sinus node dysfunction as a cause of syncope.  For now, however, I would simply monitor him.  The pretest likelihood of neurally mediated mechanism is so great, I think it is unlikely that he has a bradycardia arrhythmia to trigger his syncope.  Is also important to get an ultrasound as ordered.  Important to exclude structural heart disease.  He is at high risk for coronary artery disease given his long-term smoking history.  Would have a low threshold for pursuing coronary anatomy evaluation  We will follow with you.    Sherryl MangesSteven Alben Jepsen

## 2018-05-23 NOTE — ED Notes (Signed)
Report attempted 

## 2018-05-23 NOTE — ED Notes (Signed)
Help get patient on undress on the monitor patient is resting with call bell in reach 

## 2018-05-23 NOTE — Anesthesia Preprocedure Evaluation (Deleted)
Anesthesia Evaluation    Reviewed: Allergy & Precautions, Patient's Chart, lab work & pertinent test results  History of Anesthesia Complications Negative for: history of anesthetic complications  Airway        Dental   Pulmonary COPD,  COPD inhaler and oxygen dependent, Current Smoker,           Cardiovascular + angina +CHF     EKG - SR, PACs, inc RBBB   Neuro/Psych PSYCHIATRIC DISORDERS Depression Bipolar Disorder negative neurological ROS     GI/Hepatic negative GI ROS, (+)     substance abuse  alcohol use,   Endo/Other  negative endocrine ROS  Renal/GU negative Renal ROS  negative genitourinary   Musculoskeletal negative musculoskeletal ROS (+)   Abdominal   Peds  Hematology negative hematology ROS (+)   Anesthesia Other Findings   Reproductive/Obstetrics                            Anesthesia Physical Anesthesia Plan  ASA:   Anesthesia Plan:    Post-op Pain Management:    Induction:   PONV Risk Score and Plan:   Airway Management Planned:   Additional Equipment:   Intra-op Plan:   Post-operative Plan:   Informed Consent:   Plan Discussed with:   Anesthesia Plan Comments:        Anesthesia Quick Evaluation

## 2018-05-23 NOTE — Consult Note (Signed)
Reason for Consult: L femoral neck fx  Referring Physician: Ankit Nanavati, MD   HPI: Joel Simmons is a 62 yo male, with PMH significant for COPD, smoking, chronic alcohol consumption, CHF, and bipolar disorder, that presented to the [facility] ED via EMS following a syncopal episode at home followed by L hip pain.  His wife reports that he was intoxicated and passed out while walking to the mailbox.  He was unable to WB due to pain.  Any ROM exacerbated his symptoms.  Immoblization helps to alleviate his symptoms.  Radiographs of the L hip/pelvic demonstrate an acute L femoral neck fx.  Dr. Brian Swinteck was then consulted.  The wife reports that he smokes at least 2 packs of cigarettes daily.    Past Medical History:  Diagnosis Date  . Alcohol abuse with intoxication (HCC) 05/23/2018  . ALLERGIC RHINITIS 03/27/2010   Qualifier: Diagnosis of  By: Vereen CMA (AAMA), Rachel    . ASTHMA 03/27/2010   Qualifier: Diagnosis of  By: Todd MD, Jeffrey A   . Bipolar 1 disorder (HCC)   . CHF (congestive heart failure) (HCC)   . Closed left hip fracture, initial encounter (HCC) 05/23/2018  . COPD (chronic obstructive pulmonary disease) (HCC)   . DEPRESSIVE DISORDER 03/27/2010   Qualifier: Diagnosis of  By: Todd MD, Jeffrey A   . Syncope 05/23/2018  . TOBACCO USE 03/27/2010   Qualifier: Diagnosis of  By: Todd MD, Jeffrey A   . TOBACCO USE 03/27/2010   Qualifier: Diagnosis of  By: Todd MD, Jeffrey A     Past Surgical History:  Procedure Laterality Date  . CATARACT EXTRACTION      Family History  Problem Relation Age of Onset  . Cancer Mother        colon  . Heart disease Father        MI  . Cancer Father        prostate  . Cancer Brother        lung    Social History:  reports that he has been smoking cigarettes. He has been smoking about 1.00 pack per day. He has never used smokeless tobacco. He reports that he drinks alcohol. He reports that he does not use drugs.  Allergies: No Known  Allergies  Medications: I have reviewed the patient's current medications.  Results for orders placed or performed during the hospital encounter of 05/23/18 (from the past 48 hour(s))  Basic metabolic panel     Status: Abnormal   Collection Time: 05/23/18  9:27 AM  Result Value Ref Range   Sodium 134 (L) 135 - 145 mmol/L   Potassium 4.3 3.5 - 5.1 mmol/L   Chloride 98 98 - 111 mmol/L   CO2 26 22 - 32 mmol/L   Glucose, Bld 113 (H) 70 - 99 mg/dL   BUN 11 6 - 20 mg/dL   Creatinine, Ser 0.54 (L) 0.61 - 1.24 mg/dL   Calcium 8.9 8.9 - 10.3 mg/dL   GFR calc non Af Amer >60 >60 mL/min   GFR calc Af Amer >60 >60 mL/min    Comment: (NOTE) The eGFR has been calculated using the CKD EPI equation. This calculation has not been validated in all clinical situations. eGFR's persistently <60 mL/min signify possible Chronic Kidney Disease.    Anion gap 10 5 - 15    Comment: Performed at [facility], [address] [place], [place] [address]  CBC WITH DIFFERENTIAL     Status: Abnormal     Collection Time: 05/23/18  9:27 AM  Result Value Ref Range   WBC 15.4 (H) 4.0 - 10.5 K/uL   RBC 4.23 4.22 - 5.81 MIL/uL   Hemoglobin 14.8 13.0 - 17.0 g/dL   HCT 44.2 39.0 - 52.0 %   MCV 104.5 (H) 78.0 - 100.0 fL   MCH 35.0 (H) 26.0 - 34.0 pg   MCHC 33.5 30.0 - 36.0 g/dL   RDW 14.4 11.5 - 15.5 %   Platelets 273 150 - 400 K/uL   Neutrophils Relative % 82 %   Neutro Abs 12.6 (H) 1.7 - 7.7 K/uL   Lymphocytes Relative 6 %   Lymphs Abs 1.0 0.7 - 4.0 K/uL   Monocytes Relative 10 %   Monocytes Absolute 1.5 (H) 0.1 - 1.0 K/uL   Eosinophils Relative 0 %   Eosinophils Absolute 0.0 0.0 - 0.7 K/uL   Basophils Relative 1 %   Basophils Absolute 0.1 0.0 - 0.1 K/uL   Immature Granulocytes 1 %   Abs Immature Granulocytes 0.2 (H) 0.0 - 0.1 K/uL    Comment: Performed at [facility], [address] [place], [place] [address]  Protime-INR     Status: None   Collection Time: 05/23/18  9:27 AM  Result Value  Ref Range   Prothrombin Time 12.6 11.4 - 15.2 seconds   INR 0.95     Comment: Performed at [facility], [address] [place], [place] [address]  Type and screen [facility]     Status: None   Collection Time: 05/23/18  9:44 AM  Result Value Ref Range   ABO/RH(D) A POS    Antibody Screen NEG    Sample Expiration      05/26/2018 Performed at [facility], [address] [place], [place] [address]   Ethanol     Status: None   Collection Time: 05/23/18  9:44 AM  Result Value Ref Range   Alcohol, Ethyl (B) <10 <10 mg/dL    Comment: (NOTE) Lowest detectable limit for serum alcohol is 10 mg/dL. For medical purposes only. Performed at [facility], [address] [place], [place] [address]   ABO/Rh     Status: None (Preliminary result)   Collection Time: 05/23/18  9:44 AM  Result Value Ref Range   ABO/RH(D)      A POS Performed at [facility], [address] [place], [place] [address]   Troponin I (q 6hr x 3)     Status: None   Collection Time: 05/23/18 12:16 PM  Result Value Ref Range   Troponin I <0.03 <0.03 ng/mL    Comment: Performed at [facility], [address] [place], [place] [address]    Dg Chest 1 View  Result Date: 05/23/2018 CLINICAL DATA:  Recent fall, left hip fracture EXAM: CHEST  1 VIEW COMPARISON:  05/23/2018, 03/23/2018 FINDINGS: The heart size and mediastinal contours are within normal limits. Both lungs are clear. The visualized skeletal structures are unremarkable. IMPRESSION: No active disease. Electronically Signed   By: M.  Shick M.D.   On: 05/23/2018 10:04   Ct Head Wo Contrast  Result Date: 05/23/2018 CLINICAL DATA:  62-year-old male with acute head and neck injury EXAM: CT HEAD WITHOUT CONTRAST CT CERVICAL SPINE WITHOUT CONTRAST TECHNIQUE: Multidetector CT imaging of the head and cervical spine was performed following the standard protocol without intravenous contrast. Multiplanar CT image  reconstructions of the cervical spine were also generated.   COMPARISON:  03/23/2018 CT FINDINGS: CT HEAD FINDINGS Brain: No evidence of acute infarction, hemorrhage, hydrocephalus, extra-axial collection or mass lesion/mass effect. Minimal generalized cerebral volume loss again noted. Vascular: Carotid atherosclerotic calcifications again noted. Skull: Normal. Negative for fracture or focal lesion. Sinuses/Orbits: No acute finding. Other: None. CT CERVICAL SPINE FINDINGS Alignment: Normal. Skull base and vertebrae: No acute fracture. No primary bone lesion or focal pathologic process. Soft tissues and spinal canal: No prevertebral fluid or swelling. No visible canal hematoma. Disc levels: Moderate degenerative disc disease/spondylosis from C3-C7 again noted. Upper chest: Negative. Other: None IMPRESSION: 1. No evidence of acute intracranial abnormality. 2. No static evidence of acute injury to the cervical spine. 3. Moderate degenerative disc disease/spondylosis from C3-C7. Electronically Signed   By: Jeffrey  Hu M.D.   On: 05/23/2018 11:19   Ct Cervical Spine Wo Contrast  Result Date: 05/23/2018 CLINICAL DATA:  62-year-old male with acute head and neck injury EXAM: CT HEAD WITHOUT CONTRAST CT CERVICAL SPINE WITHOUT CONTRAST TECHNIQUE: Multidetector CT imaging of the head and cervical spine was performed following the standard protocol without intravenous contrast. Multiplanar CT image reconstructions of the cervical spine were also generated. COMPARISON:  03/23/2018 CT FINDINGS: CT HEAD FINDINGS Brain: No evidence of acute infarction, hemorrhage, hydrocephalus, extra-axial collection or mass lesion/mass effect. Minimal generalized cerebral volume loss again noted. Vascular: Carotid atherosclerotic calcifications again noted. Skull: Normal. Negative for fracture or focal lesion. Sinuses/Orbits: No acute finding. Other: None. CT CERVICAL SPINE FINDINGS Alignment: Normal. Skull base and vertebrae: No acute  fracture. No primary bone lesion or focal pathologic process. Soft tissues and spinal canal: No prevertebral fluid or swelling. No visible canal hematoma. Disc levels: Moderate degenerative disc disease/spondylosis from C3-C7 again noted. Upper chest: Negative. Other: None IMPRESSION: 1. No evidence of acute intracranial abnormality. 2. No static evidence of acute injury to the cervical spine. 3. Moderate degenerative disc disease/spondylosis from C3-C7. Electronically Signed   By: Jeffrey  Hu M.D.   On: 05/23/2018 11:19   Dg Knee Left Port  Result Date: 05/23/2018 CLINICAL DATA:  Left femoral neck fracture. EXAM: PORTABLE LEFT KNEE - 1-2 VIEW COMPARISON:  None. FINDINGS: No evidence of fracture, dislocation, or joint effusion. No evidence of arthropathy or other focal bone abnormality. Vascular calcifications. Soft tissues are unremarkable. IMPRESSION: Negative. Electronically Signed   By: William T Derry M.D.   On: 05/23/2018 11:54   Dg Hip Unilat With Pelvis 2-3 Views Left  Result Date: 05/23/2018 CLINICAL DATA:  Left hip pain after fall. EXAM: DG HIP (WITH OR WITHOUT PELVIS) 2-3V LEFT COMPARISON:  None. FINDINGS: Acute, impacted fracture of the left femoral neck. No dislocation. The hip joint spaces are relatively preserved. Soft tissues are unremarkable. IMPRESSION: Acute left femoral neck fracture. Electronically Signed   By: William T Derry M.D.   On: 05/23/2018 10:04    ROS:  Gen: Denies fever, chills, weight change, fatigue, night sweats MSK: C/o L hip pain with any motion. Neuro: Denies headache, numbness, weakness, slurred speech, loss of memory or consciousness Derm: Denies rash, dry skin, scaling or peeling skin change HEENT: Denies blurred vision, double vision, neck stiffness, dysphagia PULM: Denies shortness of breath, cough, sputum production, hemoptysis, wheezing CV: Denies chest pain, edema, orthopnea, palpitations GI: Denies abdominal pain, nausea, vomiting, diarrhea,  hematochezia, melena, constipation  Endocrine: Denies hot or cold intolerance, polyuria, polyphagia or appetite change Heme: Denies easy bruising, bleeding, bleeding gums  PE:  General: WDWN patient in NAD. Psych:  Appropriate mood and affect. Neuro:    A&O x 3, Moving all extremities, sensation intact to light touch HEENT:  EOMs intact Chest:  Even non-labored respirations Skin:  Incision C/D/I, no rashes or lesions Extremities: warm/dry, no edema, erythema or echymosis.  No lymphadenopathy. Pulses: Popliteus 2+ MSK:  L hip tender to touch.  ROM: L hip ROM limited due to pain., MMT: able to perform quad set, + log roll  Blood pressure (!) 143/75, pulse 66, temperature 98.6 F (37 C), temperature source Oral, resp. rate (!) 22, SpO2 96 %.   Assessment/Plan: L femoral neck fx  This is an unstable injury that requires surgical treatment in order for the patient to maintain an ambulatory status.  The patient will require a L THR by Dr. Brian Swinteck.    The patient will need clearance by the medicine team and cards prior to going to the OR.  Awaiting Echo results.    NPO  Hold blood thinners.  Please inform Dr. Swinteck when the patient is stable for surgery.  Justin Ollis PA-C [facility] Office:  [phone]      

## 2018-05-23 NOTE — H&P (Signed)
History and Physical    Joel Simmons ZOX:[id number] DOB: 01-13-1956 DOA: 05/23/2018  PCP: Roderick Pee, MD   Patient coming from: home    Chief Complaint: syncope, left hip fracture  HPI: Joel Simmons is a 63 y.o. male with medical history significant of COPD with active tobacco abuse on 2 L primarily at night, bipolar 1 disorder, chronic alcohol abuse, unspecified congestive heart failure comes in with syncopal episode and left hip pain.  Patient reports that he was walking to his mailbox and he suddenly found himself on the ground with severe left hip pain.  It was a fall from standing height.  He denies any vasovagal symptoms including no vertigo, warmth or coming over his face, nausea, vomiting, diarrhea.  He denied any chest pain or palpitations.  He reports he had a similar episode of syncope approximately 1 week ago where he bruised his ribs.  He does report some exertional chest tightness when he works.  He reports he is chronically short of breath and chronically wheezes.  He denies any nausea, vomiting, abdominal pain.  He does have a chronic cough that is relatively unchanged.  He reports several decades of drinking approximately 5-6 12 to 16 ounce beers daily.  He does have significant tremulousness when he does not drink but has never had a withdrawal seizure or delirium tremens.  ED Course: In the ED labs are notable for mild hyponatremia and elevated white blood cell count.  Ethanol was undetectable.  CT of the head and neck showed no acute process.  Chest x-ray was clear.  Hip showed acute left femoral neck fracture.  Orthopedic surgery was consulted and patient was admitted for femoral neck fracture. Review of Systems: As per HPI otherwise 10 point review of systems negative.   Past Medical History:  Diagnosis Date  . Alcohol abuse with intoxication (HCC) 05/23/2018  . ALLERGIC RHINITIS 03/27/2010   Qualifier: Diagnosis of  By: Thurmon Fair CMA (AAMA), Fleet Contras    . ASTHMA 03/27/2010     Qualifier: Diagnosis of  By: Tawanna Cooler MD, Eugenio Hoes   . Bipolar 1 disorder (HCC)   . CHF (congestive heart failure) (HCC)   . Closed left hip fracture, initial encounter (HCC) 05/23/2018  . COPD (chronic obstructive pulmonary disease) (HCC)   . DEPRESSIVE DISORDER 03/27/2010   Qualifier: Diagnosis of  By: Tawanna Cooler MD, Eugenio Hoes   . Syncope 05/23/2018  . TOBACCO USE 03/27/2010   Qualifier: Diagnosis of  By: Tawanna Cooler MD, Eugenio Hoes TOBACCO USE 03/27/2010   Qualifier: Diagnosis of  By: Tawanna Cooler MD, Eugenio Hoes     Past Surgical History:  Procedure Laterality Date  . CATARACT EXTRACTION       reports that he has been smoking cigarettes. He has been smoking about 1.00 pack per day. He has never used smokeless tobacco. He reports that he drinks alcohol. He reports that he does not use drugs.  No Known Allergies  Family History  Problem Relation Age of Onset  . Cancer Mother        colon  . Heart disease Father        MI  . Cancer Father        prostate  . Cancer Brother        lung     Prior to Admission medications   Medication Sig Start Date End Date Taking? Authorizing Provider  albuterol (PROVENTIL HFA;VENTOLIN HFA) 108 (90 Base) MCG/ACT inhaler Inhale 2 puffs into the lungs every 4 (  four) hours as needed for wheezing or shortness of breath.   Yes [provider]  citalopram (CELEXA) 20 MG tablet Take 1 tablet (20 mg total) by mouth daily. 08/16/13  Yes Roderick Peeodd, Jeffrey A, MD  naproxen sodium (ALEVE) 220 MG tablet Take 440 mg by mouth as needed (pain).   Yes [provider]  OXYGEN Inhale 2 L into the lungs.   Yes [provider]  chlordiazePOXIDE (LIBRIUM) 10 MG capsule Take 1 capsule (10 mg total) by mouth 3 (three) times daily as needed for anxiety. Patient not taking: Reported on 03/23/2018 04/08/13   Roderick Peeodd, Jeffrey A, MD  montelukast (SINGULAIR) 10 MG tablet Take 10 mg by mouth daily. 01/15/18   [provider]  varenicline (CHANTIX) 1 MG tablet One half tab  every morning Patient not taking: Reported on 03/23/2018 04/08/13   Roderick Peeodd, Jeffrey A, MD    Physical Exam: Vitals:   05/23/18 1030 05/23/18 1045 05/23/18 1145 05/23/18 1148  BP: (!) 153/83 (!) 182/90 (!) 122/91   Pulse: 91 97 85   Resp: (!) 27 (!) 24  17  Temp:      TempSrc:      SpO2: 92% 91% 92%     Constitutional: NAD, calm, comfortable Vitals:   05/23/18 1030 05/23/18 1045 05/23/18 1145 05/23/18 1148  BP: (!) 153/83 (!) 182/90 (!) 122/91   Pulse: 91 97 85   Resp: (!) 27 (!) 24  17  Temp:      TempSrc:      SpO2: 92% 91% 92%    Eyes: Anicteric sclera ENMT: Mucous membranes, poor dentition Neck: normal, supple Respiratory: No increased work of breathing, wheezes and rhonchi throughout lung fields, no crackles Cardiovascular: Stent heart sounds, regular rate and rhythm, no murmurs.  Abdomen: no tenderness, no masses palpated. No hepatosplenomegaly. Bowel sounds positive.  Musculoskeletal: Left lower extremity painful to manipulation and palpation over lateral hip, intact distal pulses, trace lower extremity edema.  Skin: Contusions over left hip Neurologic: Intact, moving all extremities Psychiatric: Normal judgment and insight. Alert and oriented x 3. Normal mood.   Labs on Admission: I have personally reviewed following labs and imaging studies  CBC: Recent Labs  Lab 05/23/18 0927  WBC 15.4*  NEUTROABS 12.6*  HGB 14.8  HCT 44.2  MCV 104.5*  PLT 273   Basic Metabolic Panel: Recent Labs  Lab 05/23/18 0927  NA 134*  K 4.3  CL 98  CO2 26  GLUCOSE 113*  BUN 11  CREATININE 0.54*  CALCIUM 8.9   GFR: CrCl cannot be calculated (Unknown ideal weight.). Liver Function Tests: No results for input(s): AST, ALT, ALKPHOS, BILITOT, PROT, ALBUMIN in the last 168 hours. No results for input(s): LIPASE, AMYLASE in the last 168 hours. No results for input(s): AMMONIA in the last 168 hours. Coagulation Profile: Recent Labs  Lab 05/23/18 0927  INR 0.95   Cardiac  Enzymes: No results for input(s): CKTOTAL, CKMB, CKMBINDEX, TROPONINI in the last 168 hours. BNP (last 3 results) No results for input(s): PROBNP in the last 8760 hours. HbA1C: No results for input(s): HGBA1C in the last 72 hours. CBG: No results for input(s): GLUCAP in the last 168 hours. Lipid Profile: No results for input(s): CHOL, HDL, LDLCALC, TRIG, CHOLHDL, LDLDIRECT in the last 72 hours. Thyroid Function Tests: No results for input(s): TSH, T4TOTAL, FREET4, T3FREE, THYROIDAB in the last 72 hours. Anemia Panel: No results for input(s): VITAMINB12, FOLATE, FERRITIN, TIBC, IRON, RETICCTPCT in the last 72 hours. Urine  analysis:    Component Value Date/Time   COLORURINE yellow 03/27/2010 1542   APPEARANCEUR Clear 03/27/2010 1542   LABSPEC >=1.030 03/27/2010 1542   PHURINE 5.5 03/27/2010 1542   HGBUR negative 03/27/2010 1542   BILIRUBINUR 1+ 03/27/2010 1542   UROBILINOGEN 2.0 03/27/2010 1542   NITRITE negative 03/27/2010 1542    Radiological Exams on Admission: Dg Chest 1 View  Result Date: 05/23/2018 CLINICAL DATA:  Recent fall, left hip fracture EXAM: CHEST  1 VIEW COMPARISON:  05/23/2018, 03/23/2018 FINDINGS: The heart size and mediastinal contours are within normal limits. Both lungs are clear. The visualized skeletal structures are unremarkable. IMPRESSION: No active disease. Electronically Signed   By: Judie Petit.  Shick M.D.   On: 05/23/2018 10:04   Ct Head Wo Contrast  Result Date: 05/23/2018 CLINICAL DATA:  62 year old male with acute head and neck injury EXAM: CT HEAD WITHOUT CONTRAST CT CERVICAL SPINE WITHOUT CONTRAST TECHNIQUE: Multidetector CT imaging of the head and cervical spine was performed following the standard protocol without intravenous contrast. Multiplanar CT image reconstructions of the cervical spine were also generated. COMPARISON:  03/23/2018 CT FINDINGS: CT HEAD FINDINGS Brain: No evidence of acute infarction, hemorrhage, hydrocephalus, extra-axial collection  or mass lesion/mass effect. Minimal generalized cerebral volume loss again noted. Vascular: Carotid atherosclerotic calcifications again noted. Skull: Normal. Negative for fracture or focal lesion. Sinuses/Orbits: No acute finding. Other: None. CT CERVICAL SPINE FINDINGS Alignment: Normal. Skull base and vertebrae: No acute fracture. No primary bone lesion or focal pathologic process. Soft tissues and spinal canal: No prevertebral fluid or swelling. No visible canal hematoma. Disc levels: Moderate degenerative disc disease/spondylosis from C3-C7 again noted. Upper chest: Negative. Other: None IMPRESSION: 1. No evidence of acute intracranial abnormality. 2. No static evidence of acute injury to the cervical spine. 3. Moderate degenerative disc disease/spondylosis from C3-C7. Electronically Signed   By: Harmon Pier M.D.   On: 05/23/2018 11:19   Ct Cervical Spine Wo Contrast  Result Date: 05/23/2018 CLINICAL DATA:  62 year old male with acute head and neck injury EXAM: CT HEAD WITHOUT CONTRAST CT CERVICAL SPINE WITHOUT CONTRAST TECHNIQUE: Multidetector CT imaging of the head and cervical spine was performed following the standard protocol without intravenous contrast. Multiplanar CT image reconstructions of the cervical spine were also generated. COMPARISON:  03/23/2018 CT FINDINGS: CT HEAD FINDINGS Brain: No evidence of acute infarction, hemorrhage, hydrocephalus, extra-axial collection or mass lesion/mass effect. Minimal generalized cerebral volume loss again noted. Vascular: Carotid atherosclerotic calcifications again noted. Skull: Normal. Negative for fracture or focal lesion. Sinuses/Orbits: No acute finding. Other: None. CT CERVICAL SPINE FINDINGS Alignment: Normal. Skull base and vertebrae: No acute fracture. No primary bone lesion or focal pathologic process. Soft tissues and spinal canal: No prevertebral fluid or swelling. No visible canal hematoma. Disc levels: Moderate degenerative disc  disease/spondylosis from C3-C7 again noted. Upper chest: Negative. Other: None IMPRESSION: 1. No evidence of acute intracranial abnormality. 2. No static evidence of acute injury to the cervical spine. 3. Moderate degenerative disc disease/spondylosis from C3-C7. Electronically Signed   By: Harmon Pier M.D.   On: 05/23/2018 11:19   Dg Knee Left Port  Result Date: 05/23/2018 CLINICAL DATA:  Left femoral neck fracture. EXAM: PORTABLE LEFT KNEE - 1-2 VIEW COMPARISON:  None. FINDINGS: No evidence of fracture, dislocation, or joint effusion. No evidence of arthropathy or other focal bone abnormality. Vascular calcifications. Soft tissues are unremarkable. IMPRESSION: Negative. Electronically Signed   By: Obie Dredge M.D.   On: 05/23/2018 11:54   Dg Hip  Unilat With Pelvis 2-3 Views Left  Result Date: 05/23/2018 CLINICAL DATA:  Left hip pain after fall. EXAM: DG HIP (WITH OR WITHOUT PELVIS) 2-3V LEFT COMPARISON:  None. FINDINGS: Acute, impacted fracture of the left femoral neck. No dislocation. The hip joint spaces are relatively preserved. Soft tissues are unremarkable. IMPRESSION: Acute left femoral neck fracture. Electronically Signed   By: Obie DredgeWilliam T Derry M.D.   On: 05/23/2018 10:04    EKG: Independently reviewed.  Right bundle branch block, no acute ST segment changes  Assessment/Plan Active Problems:   Bipolar disorder (HCC)   Allergic rhinitis   Asthma   Closed left hip fracture, initial encounter (HCC)   COPD (chronic obstructive pulmonary disease) (HCC)   Alcohol abuse with intoxication (HCC)   Syncope    #) Syncope complicated by left hip fracture: Unfortunately the patient syncope does not have a clear explanation.  He reports simply falling out with no prodromal symptoms.  He cannot recall any chest pain or palpitations.  He has had one prior episode and does report intermittent exertional chest pain as well.  Unfortunately because of his long history of smoking as well as  alcoholism he is at high risk for cardiac causes of syncope. -Cycle troponins -Admit to telemetry -Echo ordered -Cardiology consult for preoperative clearance -Orthopedic surgery following, will hold on surgery at this time until cleared  -continue pain management  #) Allergic rhinitis/COPD on 2 L: Patient is actively wheezing and is borderline hypoxic. - Every 6 hours duo nebs -Start IV steroids then transition to p.o. - Start LABA/ICS - Continue montelukast 10 mg daily  #) Alcohol abuse: Last drink was yesterday.  Patient is tremulous and actively withdrawing at this time. -CIWA protocol -Continue thiamine and folate supplementation -Counseling provided  #) Tobacco abuse: -Counseling provided  #) Unspecified congestive heart failure: Unclear where this diagnosis comes from as there is no echo in our system. -Echo pending -Gentle IV fluids only  #) Bipolar 1 disorder: - Continue citalopram 20 mg daily  Fluids: Gentle IV fluids for 1 day Elect lites: Monitor and supplement Nutrition: Heart healthy diet  Prophylaxis: Enoxaparin  Disposition: Pending clearance and surgery  Full code    Delaine LameShrey C Kady Toothaker MD [facility]  If 7PM-7AM, please contact night-coverage [email or web address] Password [email or web address]  05/23/2018, 12:07 PM

## 2018-05-23 NOTE — ED Notes (Signed)
Patient returned from XRay

## 2018-05-23 NOTE — ED Notes (Signed)
Patient transported to X-ray 

## 2018-05-23 NOTE — H&P (View-Only) (Signed)
Reason for Consult: L femoral neck fx  Referring Physician: Varney Biles, MD   HPI: Joel Simmons is a 62 yo male, with PMH significant for COPD, smoking, chronic alcohol consumption, CHF, and bipolar disorder, that presented to the [facility] ED via EMS following a syncopal episode at home followed by L hip pain.  His wife reports that he was intoxicated and passed out while walking to the mailbox.  He was unable to WB due to pain.  Any ROM exacerbated his symptoms.  Immoblization helps to alleviate his symptoms.  Radiographs of the L hip/pelvic demonstrate an acute L femoral neck fx.  Dr. Rod Can was then consulted.  The wife reports that he smokes at least 2 packs of cigarettes daily.    Past Medical History:  Diagnosis Date  . Alcohol abuse with intoxication ([place]) 05/23/2018  . ALLERGIC RHINITIS 03/27/2010   Qualifier: Diagnosis of  By: Regino Schultze CMA ([place]), Apolonio Schneiders    . ASTHMA 03/27/2010   Qualifier: Diagnosis of  By: Sherren Mocha MD, Jory Ee   . Bipolar 1 disorder ([place])   . CHF (congestive heart failure) ([place])   . Closed left hip fracture, initial encounter ([place]) 05/23/2018  . COPD (chronic obstructive pulmonary disease) ([place])   . DEPRESSIVE DISORDER 03/27/2010   Qualifier: Diagnosis of  By: Sherren Mocha MD, Jory Ee   . Syncope 05/23/2018  . TOBACCO USE 03/27/2010   Qualifier: Diagnosis of  By: Sherren Mocha MD, Jory Ee TOBACCO USE 03/27/2010   Qualifier: Diagnosis of  By: Sherren Mocha MD, Jory Ee     Past Surgical History:  Procedure Laterality Date  . CATARACT EXTRACTION      Family History  Problem Relation Age of Onset  . Cancer Mother        colon  . Heart disease Father        MI  . Cancer Father        prostate  . Cancer Brother        lung    Social History:  reports that he has been smoking cigarettes. He has been smoking about 1.00 pack per day. He has never used smokeless tobacco. He reports that he drinks alcohol. He reports that he does not use drugs.  Allergies: No Known  Allergies  Medications: I have reviewed the patient's current medications.  Results for orders placed or performed during the hospital encounter of 05/23/18 (from the past 48 hour(s))  Basic metabolic panel     Status: Abnormal   Collection Time: 05/23/18  9:27 AM  Result Value Ref Range   Sodium 134 (L) 135 - 145 mmol/L   Potassium 4.3 3.5 - 5.1 mmol/L   Chloride 98 98 - 111 mmol/L   CO2 26 22 - 32 mmol/L   Glucose, Bld 113 (H) 70 - 99 mg/dL   BUN 11 6 - 20 mg/dL   Creatinine, Ser 0.54 (L) 0.61 - 1.24 mg/dL   Calcium 8.9 8.9 - 10.3 mg/dL   GFR calc non Af Amer >60 >60 mL/min   GFR calc Af Amer >60 >60 mL/min    Comment: (NOTE) The eGFR has been calculated using the CKD EPI equation. This calculation has not been validated in all clinical situations. eGFR's persistently <60 mL/min signify possible Chronic Kidney Disease.    Anion gap 10 5 - 15    Comment: Performed at [facility] [address], [place], [place] [address]  CBC WITH DIFFERENTIAL     Status: Abnormal  Collection Time: 05/23/18  9:27 AM  Result Value Ref Range   WBC 15.4 (H) 4.0 - 10.5 K/uL   RBC 4.23 4.22 - 5.81 MIL/uL   Hemoglobin 14.8 13.0 - 17.0 g/dL   HCT 44.2 39.0 - 52.0 %   MCV 104.5 (H) 78.0 - 100.0 fL   MCH 35.0 (H) 26.0 - 34.0 pg   MCHC 33.5 30.0 - 36.0 g/dL   RDW 14.4 11.5 - 15.5 %   Platelets 273 150 - 400 K/uL   Neutrophils Relative % 82 %   Neutro Abs 12.6 (H) 1.7 - 7.7 K/uL   Lymphocytes Relative 6 %   Lymphs Abs 1.0 0.7 - 4.0 K/uL   Monocytes Relative 10 %   Monocytes Absolute 1.5 (H) 0.1 - 1.0 K/uL   Eosinophils Relative 0 %   Eosinophils Absolute 0.0 0.0 - 0.7 K/uL   Basophils Relative 1 %   Basophils Absolute 0.1 0.0 - 0.1 K/uL   Immature Granulocytes 1 %   Abs Immature Granulocytes 0.2 (H) 0.0 - 0.1 K/uL    Comment: Performed at [facility], [address], [place], [place] [address]  Protime-INR     Status: None   Collection Time: 05/23/18  9:27 AM  Result Value  Ref Range   Prothrombin Time 12.6 11.4 - 15.2 seconds   INR 0.95     Comment: Performed at [facility] [address], [place], [place] [address]  Type and screen [place]     Status: None   Collection Time: 05/23/18  9:44 AM  Result Value Ref Range   ABO/RH(D) A POS    Antibody Screen NEG    Sample Expiration      05/26/2018 Performed at [facility], [place] [address], [place], [place] [address]   Ethanol     Status: None   Collection Time: 05/23/18  9:44 AM  Result Value Ref Range   Alcohol, Ethyl (B) <10 <10 mg/dL    Comment: (NOTE) Lowest detectable limit for serum alcohol is 10 mg/dL. For medical purposes only. Performed at [facility], [place] [address], [place], [place] [address]   ABO/Rh     Status: None (Preliminary result)   Collection Time: 05/23/18  9:44 AM  Result Value Ref Range   ABO/RH(D)      A POS Performed at [facility] [address], [place], [place] [address]   Troponin I (q 6hr x 3)     Status: None   Collection Time: 05/23/18 12:16 PM  Result Value Ref Range   Troponin I <0.03 <0.03 ng/mL    Comment: Performed at [facility] [address], [place], [place] [address]    Dg Chest 1 View  Result Date: 05/23/2018 CLINICAL DATA:  Recent fall, left hip fracture EXAM: CHEST  1 VIEW COMPARISON:  05/23/2018, 03/23/2018 FINDINGS: The heart size and mediastinal contours are within normal limits. Both lungs are clear. The visualized skeletal structures are unremarkable. IMPRESSION: No active disease. Electronically Signed   By: Jerilynn Mages.  Shick M.D.   On: 05/23/2018 10:04   Ct Head Wo Contrast  Result Date: 05/23/2018 CLINICAL DATA:  62 year old male with acute head and neck injury EXAM: CT HEAD WITHOUT CONTRAST CT CERVICAL SPINE WITHOUT CONTRAST TECHNIQUE: Multidetector CT imaging of the head and cervical spine was performed following the standard protocol without intravenous contrast. Multiplanar CT image  reconstructions of the cervical spine were also generated.  COMPARISON:  03/23/2018 CT FINDINGS: CT HEAD FINDINGS Brain: No evidence of acute infarction, hemorrhage, hydrocephalus, extra-axial collection or mass lesion/mass effect. Minimal generalized cerebral volume loss again noted. Vascular: Carotid atherosclerotic calcifications again noted. Skull: Normal. Negative for fracture or focal lesion. Sinuses/Orbits: No acute finding. Other: None. CT CERVICAL SPINE FINDINGS Alignment: Normal. Skull base and vertebrae: No acute fracture. No primary bone lesion or focal pathologic process. Soft tissues and spinal canal: No prevertebral fluid or swelling. No visible canal hematoma. Disc levels: Moderate degenerative disc disease/spondylosis from C3-C7 again noted. Upper chest: Negative. Other: None IMPRESSION: 1. No evidence of acute intracranial abnormality. 2. No static evidence of acute injury to the cervical spine. 3. Moderate degenerative disc disease/spondylosis from C3-C7. Electronically Signed   By: Margarette Canada M.D.   On: 05/23/2018 11:19   Ct Cervical Spine Wo Contrast  Result Date: 05/23/2018 CLINICAL DATA:  62 year old male with acute head and neck injury EXAM: CT HEAD WITHOUT CONTRAST CT CERVICAL SPINE WITHOUT CONTRAST TECHNIQUE: Multidetector CT imaging of the head and cervical spine was performed following the standard protocol without intravenous contrast. Multiplanar CT image reconstructions of the cervical spine were also generated. COMPARISON:  03/23/2018 CT FINDINGS: CT HEAD FINDINGS Brain: No evidence of acute infarction, hemorrhage, hydrocephalus, extra-axial collection or mass lesion/mass effect. Minimal generalized cerebral volume loss again noted. Vascular: Carotid atherosclerotic calcifications again noted. Skull: Normal. Negative for fracture or focal lesion. Sinuses/Orbits: No acute finding. Other: None. CT CERVICAL SPINE FINDINGS Alignment: Normal. Skull base and vertebrae: No acute  fracture. No primary bone lesion or focal pathologic process. Soft tissues and spinal canal: No prevertebral fluid or swelling. No visible canal hematoma. Disc levels: Moderate degenerative disc disease/spondylosis from C3-C7 again noted. Upper chest: Negative. Other: None IMPRESSION: 1. No evidence of acute intracranial abnormality. 2. No static evidence of acute injury to the cervical spine. 3. Moderate degenerative disc disease/spondylosis from C3-C7. Electronically Signed   By: Margarette Canada M.D.   On: 05/23/2018 11:19   Dg Knee Left Port  Result Date: 05/23/2018 CLINICAL DATA:  Left femoral neck fracture. EXAM: PORTABLE LEFT KNEE - 1-2 VIEW COMPARISON:  None. FINDINGS: No evidence of fracture, dislocation, or joint effusion. No evidence of arthropathy or other focal bone abnormality. Vascular calcifications. Soft tissues are unremarkable. IMPRESSION: Negative. Electronically Signed   By: Titus Dubin M.D.   On: 05/23/2018 11:54   Dg Hip Unilat With Pelvis 2-3 Views Left  Result Date: 05/23/2018 CLINICAL DATA:  Left hip pain after fall. EXAM: DG HIP (WITH OR WITHOUT PELVIS) 2-3V LEFT COMPARISON:  None. FINDINGS: Acute, impacted fracture of the left femoral neck. No dislocation. The hip joint spaces are relatively preserved. Soft tissues are unremarkable. IMPRESSION: Acute left femoral neck fracture. Electronically Signed   By: Titus Dubin M.D.   On: 05/23/2018 10:04    ROS:  Gen: Denies fever, chills, weight change, fatigue, night sweats MSK: C/o L hip pain with any motion. Neuro: Denies headache, numbness, weakness, slurred speech, loss of memory or consciousness Derm: Denies rash, dry skin, scaling or peeling skin change HEENT: Denies blurred vision, double vision, neck stiffness, dysphagia PULM: Denies shortness of breath, cough, sputum production, hemoptysis, wheezing CV: Denies chest pain, edema, orthopnea, palpitations GI: Denies abdominal pain, nausea, vomiting, diarrhea,  hematochezia, melena, constipation  Endocrine: Denies hot or cold intolerance, polyuria, polyphagia or appetite change Heme: Denies easy bruising, bleeding, bleeding gums  PE:  General: WDWN patient in NAD. Psych:  Appropriate mood and affect. Neuro:  A&O x 3, Moving all extremities, sensation intact to light touch HEENT:  EOMs intact Chest:  Even non-labored respirations Skin:  Incision C/D/I, no rashes or lesions Extremities: warm/dry, no edema, erythema or echymosis.  No lymphadenopathy. Pulses: Popliteus 2+ MSK:  L hip tender to touch.  ROM: L hip ROM limited due to pain., MMT: able to perform quad set, + log roll  Blood pressure (!) 143/75, pulse 66, temperature 98.6 F (37 C), temperature source Oral, resp. rate (!) 22, SpO2 96 %.   Assessment/Plan: L femoral neck fx  This is an unstable injury that requires surgical treatment in order for the patient to maintain an ambulatory status.  The patient will require a L THR by Dr. Rod Can.    The patient will need clearance by the medicine team and cards prior to going to the OR.  Awaiting Echo results.    NPO  Hold blood thinners.  Please inform Dr. Lyla Glassing when the patient is stable for surgery.  Mechele Claude PA-C [facility] Office:  [phone]

## 2018-05-24 ENCOUNTER — Other Ambulatory Visit: Payer: Self-pay

## 2018-05-24 ENCOUNTER — Inpatient Hospital Stay (HOSPITAL_COMMUNITY): Payer: BLUE CROSS/BLUE SHIELD

## 2018-05-24 DIAGNOSIS — J449 Chronic obstructive pulmonary disease, unspecified: Secondary | ICD-10-CM

## 2018-05-24 DIAGNOSIS — S72002A Fracture of unspecified part of neck of left femur, initial encounter for closed fracture: Principal | ICD-10-CM

## 2018-05-24 DIAGNOSIS — J45909 Unspecified asthma, uncomplicated: Secondary | ICD-10-CM

## 2018-05-24 DIAGNOSIS — R55 Syncope and collapse: Secondary | ICD-10-CM

## 2018-05-24 LAB — BASIC METABOLIC PANEL
BUN: 10 mg/dL (ref 6–20)
CO2: 30 mmol/L (ref 22–32)
Chloride: 100 mmol/L (ref 98–111)
Creatinine, Ser: <0.3 mg/dL — ABNORMAL LOW (ref 0.61–1.24)
Potassium: 4.3 mmol/L (ref 3.5–5.1)
Sodium: 137 mmol/L (ref 135–145)

## 2018-05-24 LAB — MRSA PCR SCREENING: MRSA by PCR: NEGATIVE

## 2018-05-24 LAB — CBC
HCT: 39.2 % (ref 39.0–52.0)
Hemoglobin: 13.5 g/dL (ref 13.0–17.0)
MCH: 35.9 pg — ABNORMAL HIGH (ref 26.0–34.0)
MCHC: 34.4 g/dL (ref 30.0–36.0)
MCV: 104.3 fL — ABNORMAL HIGH (ref 78.0–100.0)
Platelets: 236 K/uL (ref 150–400)
RBC: 3.76 MIL/uL — ABNORMAL LOW (ref 4.22–5.81)
RDW: 14.2 % (ref 11.5–15.5)
WBC: 10.7 K/uL — ABNORMAL HIGH (ref 4.0–10.5)

## 2018-05-24 LAB — ECHOCARDIOGRAM COMPLETE
Height: 69 in
Weight: 2608 [oz_av]

## 2018-05-24 LAB — TROPONIN I: Troponin I: <0.03 ng/mL (ref <0.03)

## 2018-05-24 LAB — BASIC METABOLIC PANEL WITH GFR
Anion gap: 7 (ref 5–15)
Calcium: 8.7 mg/dL — ABNORMAL LOW (ref 8.9–10.3)
Glucose, Bld: 157 mg/dL — ABNORMAL HIGH (ref 70–99)

## 2018-05-24 LAB — HIV ANTIBODY (ROUTINE TESTING W REFLEX): HIV Screen 4th Generation wRfx: NONREACTIVE

## 2018-05-24 MED ORDER — IPRATROPIUM-ALBUTEROL 0.5-2.5 (3) MG/3ML IN SOLN
3.0000 mL | Freq: Three times a day (TID) | RESPIRATORY_TRACT | Status: DC
  Administered 2018-05-24 – 2018-05-25 (×4): 3 mL via RESPIRATORY_TRACT
  Filled 2018-05-24 (×5): qty 3

## 2018-05-24 MED ORDER — ALBUTEROL SULFATE (2.5 MG/3ML) 0.083% IN NEBU: 2.5000 mg | INHALATION_SOLUTION | RESPIRATORY_TRACT | Status: DC | PRN

## 2018-05-24 MED ORDER — IPRATROPIUM-ALBUTEROL 0.5-2.5 (3) MG/3ML IN SOLN: 3.0000 mL | Freq: Four times a day (QID) | RESPIRATORY_TRACT | Status: DC

## 2018-05-24 NOTE — Plan of Care (Signed)
  Problem: Pain Managment: Goal: General experience of comfort will improve Outcome: Progressing   

## 2018-05-24 NOTE — Progress Notes (Signed)
  Echocardiogram 2D Echocardiogram has been performed.  Joel Simmons, Joel Simmons 05/24/2018, 9:01 AM

## 2018-05-24 NOTE — Progress Notes (Signed)
PROGRESS NOTE    Joel Simmons  ZOX:[id number] DOB: September 09, 1956 DOA: 05/23/2018 PCP: Roderick Pee, MD      Brief Narrative:  Joel Simmons is a 62 y.o. M with COPD, chronic respiratory failure, noncompliant with home O2, alcohol use disorder and ?Bipolar who presents with syncope and LEFT hip fracture.     Assessment & Plan:  Left hip fracture Orthopedics consulted. Oxycodone per protocol. NPO No Lovenox.  Cleared for surgery, with caveat that patient is at moderate risk for the planned surgery, primarily from pulmonary standpoint.  Patient has a RCRI score of 0, his syncope (see below) was vagal, echo normal. He has no active cardiac symptoms, despite his lung disease a functional capacity >4 METs, and would be expected to be at average risk for cardiac complications from this intermediate risk procedure.  However, he has chronic respiratory failure, O2 sats typically at home between 82-85% and 90-95%, and he is noncompliant with O2 most of the day.  He still smokes.  I believe he is in a mild flare, but his respiratory symptoms at present could also be his baseline.  Transfusion threshold 8 mg/dL. Recommend osteoporosis screening after discharge if not done previously    Syncope Telemetry unremarkable.  Seen by Dr. Graciela Husbands, suspect syncope was vagally mediated.  Echo done today, shows normal EF, normal wall thickness.  COPD with mild exacerbation Chronic respiratory failure Wife thinks he is more symptomatically dyspneic this week, patient states his breathing is at baseline.  All of his wheezing may be his baseline, given how poorly adherent he is to inhalers, O2 etc, and still smoking, or he may be in a mild flare.  His SpO2 is easily normalized with 2L  and he does not appear tachynpeic, so I believe surgery should not be further delayed for COPD. -Continue IV steroids 1 day, then prednisone daily -Schedule and PRN bronchodilators -Continue Dulera, Singulair  Alcohol use  disorder No prior history of shakes, mild withdrawal.  No history seizures, DTs.  He is only mildly withdrawing now. -Continue CIWA scoring -Continue thiamine, folate  Doubt chronic congestive heart failure Family report he has been diagnosed in past with CHF.  His EF is normal, no diastolic dysfunction on echo here.  Furthermore, does not take daily diuretic, nor has ever had congestive episode requiring diuretics either in or outpatient.  Other medications -Continue citalopram  Leukocytosis Likely reactive.       DVT prophylaxis: SCDs Code Status: FULL Family Communication: Wife and son at bedside MDM and disposition Plan: The below labs and imaging reports were reviewed and summarized above.  Medication mgmt as above.  The patient was admitted with LEFT hip fracture, mild COPD flare.  To OR at any time.   Consultants:   Orthopedics  Cardiology  Procedures:   Echo  Antimicrobials:   None    Subjective: Feeling normal.  Hip pain, worse with flexing hip.  No confusion, fever.  Breathing at baseline.   Cough at baseline.  No change in wheezing sputum.  No new chest pain, orthopnea, swelling, palpitations.  Objective: Vitals:   05/23/18 2128 05/24/18 0404 05/24/18 0405 05/24/18 0406  BP: 120/69 97/66  109/74  Pulse: 66 61  63  Resp:      Temp: (!) 97.5 F (36.4 C) 97.6 F (36.4 C)    TempSrc: Oral Oral    SpO2: 97%  100%   Weight:   73.9 kg   Height:        Intake/Output  Summary (Last 24 hours) at 05/24/2018 1222 Last data filed at 05/24/2018 0500 Gross per 24 hour  Intake 1243.64 ml  Output 700 ml  Net 543.64 ml   Filed Weights   05/23/18 1557 05/24/18 0405  Weight: 72 kg 73.9 kg    Examination: General appearance: thin adult male, alert and in no acute distress.   HEENT: Anicteric, conjunctiva pink, lids and lashes normal. No nasal deformity, discharge, epistaxis.  Lips moist, dentition poor, no oral lesions, OP moist, hearing normal.   Skin:  Warm and dry.  no jaundice.  No suspicious rashes or lesions. Cardiac: RRR, nl S1-S2, no murmurs appreciated.  Capillary refill is brisk.  JVP normal.  No LE edema.  Radia  pulses 2+ and symmetric. Respiratory: Normal respiratory rate and rhythm.  Coarse wheezing throughout. Abdomen: Abdomen soft.  No TTP, no guadring. No ascites, distension, hepatosplenomegaly.   MSK: No deformities or effusions of large joints.  There is gaurding of the left leg which is slightly externally rotatted Neuro: Awake and alert.  EOMI, moves all extremities. Speech fluent.    Psych: Sensorium intact and responding to questions, attention normal. Affect normal.  Judgment and insight appear normal.    Data Reviewed: I have personally reviewed following labs and imaging studies:  CBC: Recent Labs  Lab 05/23/18 0927 05/23/18 2353  WBC 15.4* 10.7*  NEUTROABS 12.6*  --   HGB 14.8 13.5  HCT 44.2 39.2  MCV 104.5* 104.3*  PLT 273 236   Basic Metabolic Panel: Recent Labs  Lab 05/23/18 0927 05/23/18 2353  NA 134* 137  K 4.3 4.3  CL 98 100  CO2 26 30  GLUCOSE 113* 157*  BUN 11 10  CREATININE 0.54* <0.30*  CALCIUM 8.9 8.7*   GFR: CrCl cannot be calculated (This lab value cannot be used to calculate CrCl because it is not a number: <0.30). Liver Function Tests: No results for input(s): AST, ALT, ALKPHOS, BILITOT, PROT, ALBUMIN in the last 168 hours. No results for input(s): LIPASE, AMYLASE in the last 168 hours. No results for input(s): AMMONIA in the last 168 hours. Coagulation Profile: Recent Labs  Lab 05/23/18 0927  INR 0.95   Cardiac Enzymes: Recent Labs  Lab 05/23/18 1216 05/23/18 1726 05/23/18 2353  TROPONINI <0.03 <0.03 <0.03   BNP (last 3 results) No results for input(s): PROBNP in the last 8760 hours. HbA1C: No results for input(s): HGBA1C in the last 72 hours. CBG: No results for input(s): GLUCAP in the last 168 hours. Lipid Profile: No results for input(s): CHOL, HDL,  LDLCALC, TRIG, CHOLHDL, LDLDIRECT in the last 72 hours. Thyroid Function Tests: Recent Labs    05/23/18 1619  TSH 2.061   Anemia Panel: No results for input(s): VITAMINB12, FOLATE, FERRITIN, TIBC, IRON, RETICCTPCT in the last 72 hours. Urine analysis:    Component Value Date/Time   COLORURINE yellow 03/27/2010 1542   APPEARANCEUR Clear 03/27/2010 1542   LABSPEC >=1.030 03/27/2010 1542   PHURINE 5.5 03/27/2010 1542   HGBUR negative 03/27/2010 1542   BILIRUBINUR 1+ 03/27/2010 1542   UROBILINOGEN 2.0 03/27/2010 1542   NITRITE negative 03/27/2010 1542   Sepsis Labs: @LABRCNTIP (procalcitonin:4,lacticacidven:4)  ) Recent Results (from the past 240 hour(s))  MRSA PCR Screening     Status: None   Collection Time: 05/24/18  7:15 AM  Result Value Ref Range Status   MRSA by PCR NEGATIVE NEGATIVE Final    Comment:        The GeneXpert MRSA Assay (FDA approved for  NASAL specimens only), is one component of a comprehensive MRSA colonization surveillance program. It is not intended to diagnose MRSA infection nor to guide or monitor treatment for MRSA infections. Performed at [facility], [address] [place], [place] [address]          Radiology Studies: Dg Chest 1 View  Result Date: 05/23/2018 CLINICAL DATA:  Recent fall, left hip fracture EXAM: CHEST  1 VIEW COMPARISON:  05/23/2018, 03/23/2018 FINDINGS: The heart size and mediastinal contours are within normal limits. Both lungs are clear. The visualized skeletal structures are unremarkable. IMPRESSION: No active disease. Electronically Signed   By: Judie PetitM.  Shick M.D.   On: 05/23/2018 10:04   Ct Head Wo Contrast  Result Date: 05/23/2018 CLINICAL DATA:  62 year old male with acute head and neck injury EXAM: CT HEAD WITHOUT CONTRAST CT CERVICAL SPINE WITHOUT CONTRAST TECHNIQUE: Multidetector CT imaging of the head and cervical spine was performed following the standard protocol without intravenous contrast. Multiplanar  CT image reconstructions of the cervical spine were also generated. COMPARISON:  03/23/2018 CT FINDINGS: CT HEAD FINDINGS Brain: No evidence of acute infarction, hemorrhage, hydrocephalus, extra-axial collection or mass lesion/mass effect. Minimal generalized cerebral volume loss again noted. Vascular: Carotid atherosclerotic calcifications again noted. Skull: Normal. Negative for fracture or focal lesion. Sinuses/Orbits: No acute finding. Other: None. CT CERVICAL SPINE FINDINGS Alignment: Normal. Skull base and vertebrae: No acute fracture. No primary bone lesion or focal pathologic process. Soft tissues and spinal canal: No prevertebral fluid or swelling. No visible canal hematoma. Disc levels: Moderate degenerative disc disease/spondylosis from C3-C7 again noted. Upper chest: Negative. Other: None IMPRESSION: 1. No evidence of acute intracranial abnormality. 2. No static evidence of acute injury to the cervical spine. 3. Moderate degenerative disc disease/spondylosis from C3-C7. Electronically Signed   By: Harmon PierJeffrey  Hu M.D.   On: 05/23/2018 11:19   Ct Cervical Spine Wo Contrast  Result Date: 05/23/2018 CLINICAL DATA:  62 year old male with acute head and neck injury EXAM: CT HEAD WITHOUT CONTRAST CT CERVICAL SPINE WITHOUT CONTRAST TECHNIQUE: Multidetector CT imaging of the head and cervical spine was performed following the standard protocol without intravenous contrast. Multiplanar CT image reconstructions of the cervical spine were also generated. COMPARISON:  03/23/2018 CT FINDINGS: CT HEAD FINDINGS Brain: No evidence of acute infarction, hemorrhage, hydrocephalus, extra-axial collection or mass lesion/mass effect. Minimal generalized cerebral volume loss again noted. Vascular: Carotid atherosclerotic calcifications again noted. Skull: Normal. Negative for fracture or focal lesion. Sinuses/Orbits: No acute finding. Other: None. CT CERVICAL SPINE FINDINGS Alignment: Normal. Skull base and vertebrae: No acute  fracture. No primary bone lesion or focal pathologic process. Soft tissues and spinal canal: No prevertebral fluid or swelling. No visible canal hematoma. Disc levels: Moderate degenerative disc disease/spondylosis from C3-C7 again noted. Upper chest: Negative. Other: None IMPRESSION: 1. No evidence of acute intracranial abnormality. 2. No static evidence of acute injury to the cervical spine. 3. Moderate degenerative disc disease/spondylosis from C3-C7. Electronically Signed   By: Harmon PierJeffrey  Hu M.D.   On: 05/23/2018 11:19   Dg Knee Left Port  Result Date: 05/23/2018 CLINICAL DATA:  Left femoral neck fracture. EXAM: PORTABLE LEFT KNEE - 1-2 VIEW COMPARISON:  None. FINDINGS: No evidence of fracture, dislocation, or joint effusion. No evidence of arthropathy or other focal bone abnormality. Vascular calcifications. Soft tissues are unremarkable. IMPRESSION: Negative. Electronically Signed   By: Obie DredgeWilliam T Derry M.D.   On: 05/23/2018 11:54   Dg Hip Unilat With Pelvis 2-3 Views Left  Result  Date: 05/23/2018 CLINICAL DATA:  Left hip pain after fall. EXAM: DG HIP (WITH OR WITHOUT PELVIS) 2-3V LEFT COMPARISON:  None. FINDINGS: Acute, impacted fracture of the left femoral neck. No dislocation. The hip joint spaces are relatively preserved. Soft tissues are unremarkable. IMPRESSION: Acute left femoral neck fracture. Electronically Signed   By: Obie DredgeWilliam T Derry M.D.   On: 05/23/2018 10:04        Scheduled Meds: . citalopram  20 mg Oral Daily  . enoxaparin (LOVENOX) injection  40 mg Subcutaneous Q24H  . folic acid  1 mg Oral Daily  . mometasone-formoterol  1 puff Inhalation BID  . montelukast  10 mg Oral Daily  . multivitamin with minerals  1 tablet Oral Daily  . [START ON 05/25/2018] predniSONE  40 mg Oral Q breakfast  . thiamine  100 mg Oral Daily   Or  . thiamine  100 mg Intravenous Daily   Continuous Infusions: . sodium chloride 100 mL/hr at 05/24/18 0500     LOS: 1 day    Time spent: 25  minutes    Alberteen Samhristopher P Danford, MD [facility] 05/24/2018, 12:22 PM     Pager [phone] --- please page though AMION:  [email or web address] If 7PM-7AM, please contact night-coverage

## 2018-05-24 NOTE — Progress Notes (Signed)
Subjective: Left Femoral Neck Fracture  Joel Simmons is resting comfortably in bed and states that his pain is well-controlled.  He is currently NPO. He denies chest pain, SOB, N/V/D, fever, or chills.   Objective: Vital signs in last 24 hours: Temp:  [97.5 F (36.4 C)-98 F (36.7 C)] 97.6 F (36.4 C) (08/18 0404) Pulse Rate:  [61-97] 63 (08/18 0406) Resp:  [17-27] 23 (08/17 1551) BP: (97-182)/(66-92) 109/74 (08/18 0406) SpO2:  [89 %-100 %] 100 % (08/18 0405) Weight:  [72 kg-73.9 kg] 73.9 kg (08/18 0405)  Intake/Output from previous day: 08/17 0701 - 08/18 0700 In: 1243.6 [P.O.:360; I.V.:883.6] Out: 700 [Urine:700] Intake/Output this shift: No intake/output data recorded.  Recent Labs    05/23/18 0927 05/23/18 2353  HGB 14.8 13.5   Recent Labs    05/23/18 0927 05/23/18 2353  WBC 15.4* 10.7*  RBC 4.23 3.76*  HCT 44.2 39.2  PLT 273 236   Recent Labs    05/23/18 0927 05/23/18 2353  NA 134* 137  K 4.3 4.3  CL 98 100  CO2 26 30  BUN 11 10  CREATININE 0.54* <0.30*  GLUCOSE 113* 157*  CALCIUM 8.9 8.7*   Recent Labs    05/23/18 0927  INR 0.95    A&O x 4 Sensation intact distally Intact pulses distally Compartment soft Able to wiggle all toes without difficulty. Mild discomfort with plantarflexion and dorsiflexion. (-) Homan's    Assessment/Plan: Left Femoral Neck Fracture -Non-WB of LLE -NPO -Hold blood thinners -Awaiting clearance by the medicine team and cardiology prior to surgery    Karma GreaserSamantha Bonham Jame Seelig 05/24/2018, 9:33 AM

## 2018-05-25 ENCOUNTER — Inpatient Hospital Stay (HOSPITAL_COMMUNITY): Payer: BLUE CROSS/BLUE SHIELD

## 2018-05-25 ENCOUNTER — Encounter (HOSPITAL_COMMUNITY): Payer: Self-pay

## 2018-05-25 ENCOUNTER — Inpatient Hospital Stay (HOSPITAL_COMMUNITY): Payer: BLUE CROSS/BLUE SHIELD | Admitting: Certified Registered Nurse Anesthetist

## 2018-05-25 ENCOUNTER — Encounter (HOSPITAL_COMMUNITY)
Admission: EM | Disposition: A | Discharge status: Home / Self Care | Payer: Self-pay | Source: Home / Self Care | Attending: Family Medicine

## 2018-05-25 DIAGNOSIS — I951 Orthostatic hypotension: Secondary | ICD-10-CM

## 2018-05-25 DIAGNOSIS — S72002A Fracture of unspecified part of neck of left femur, initial encounter for closed fracture: Secondary | ICD-10-CM | POA: Diagnosis present

## 2018-05-25 DIAGNOSIS — F10129 Alcohol abuse with intoxication, unspecified: Secondary | ICD-10-CM

## 2018-05-25 HISTORY — PX: TOTAL HIP ARTHROPLASTY: SHX124

## 2018-05-25 LAB — BASIC METABOLIC PANEL
ANION GAP: 6 (ref 5–15)
BUN: 17 mg/dL (ref 6–20)
CALCIUM: 8.8 mg/dL — AB (ref 8.9–10.3)
CO2: 32 mmol/L (ref 22–32)
Chloride: 102 mmol/L (ref 98–111)
Creatinine, Ser: 0.53 mg/dL — ABNORMAL LOW (ref 0.61–1.24)
Glucose, Bld: 93 mg/dL (ref 70–99)
POTASSIUM: 3.8 mmol/L (ref 3.5–5.1)
Sodium: 140 mmol/L (ref 135–145)

## 2018-05-25 LAB — CBC
HEMATOCRIT: 36.5 % — AB (ref 39.0–52.0)
HEMOGLOBIN: 12.3 g/dL — AB (ref 13.0–17.0)
MCH: 35.3 pg — ABNORMAL HIGH (ref 26.0–34.0)
MCHC: 33.7 g/dL (ref 30.0–36.0)
MCV: 104.9 fL — ABNORMAL HIGH (ref 78.0–100.0)
Platelets: 232 10*3/uL (ref 150–400)
RBC: 3.48 MIL/uL — AB (ref 4.22–5.81)
RDW: 14.1 % (ref 11.5–15.5)
WBC: 12.6 10*3/uL — ABNORMAL HIGH (ref 4.0–10.5)

## 2018-05-25 SURGERY — ARTHROPLASTY, HIP, TOTAL, ANTERIOR APPROACH
Anesthesia: Spinal | Site: Hip | Laterality: Left

## 2018-05-25 MED ORDER — PROPOFOL 500 MG/50ML IV EMUL
INTRAVENOUS | Status: DC | PRN
  Administered 2018-05-25: 100 ug/kg/min via INTRAVENOUS

## 2018-05-25 MED ORDER — FENTANYL CITRATE (PF) 250 MCG/5ML IJ SOLN
INTRAMUSCULAR | Status: AC
  Filled 2018-05-25: qty 5

## 2018-05-25 MED ORDER — TRANEXAMIC ACID 1000 MG/10ML IV SOLN
1000.0000 mg | INTRAVENOUS | Status: AC
  Administered 2018-05-25: 1000 mg via INTRAVENOUS
  Filled 2018-05-25: qty 1000

## 2018-05-25 MED ORDER — SODIUM CHLORIDE 0.9 % IV SOLN: INTRAVENOUS | Status: DC

## 2018-05-25 MED ORDER — PHENOL 1.4 % MT LIQD: 1.0000 | OROMUCOSAL | Status: DC | PRN

## 2018-05-25 MED ORDER — KETOROLAC TROMETHAMINE 30 MG/ML IJ SOLN
INTRAMUSCULAR | Status: AC
  Filled 2018-05-25: qty 1

## 2018-05-25 MED ORDER — MIDAZOLAM HCL 2 MG/2ML IJ SOLN
INTRAMUSCULAR | Status: DC | PRN
  Administered 2018-05-25 (×2): 1 mg via INTRAVENOUS

## 2018-05-25 MED ORDER — ONDANSETRON HCL 4 MG/2ML IJ SOLN
INTRAMUSCULAR | Status: AC
  Filled 2018-05-25: qty 2

## 2018-05-25 MED ORDER — CEFAZOLIN SODIUM-DEXTROSE 2-4 GM/100ML-% IV SOLN
2.0000 g | Freq: Four times a day (QID) | INTRAVENOUS | Status: AC
  Administered 2018-05-25 (×2): 2 g via INTRAVENOUS
  Filled 2018-05-25 (×2): qty 100

## 2018-05-25 MED ORDER — CHLORHEXIDINE GLUCONATE 4 % EX LIQD: 60.0000 mL | Freq: Once | CUTANEOUS | Status: DC

## 2018-05-25 MED ORDER — MIDAZOLAM HCL 2 MG/2ML IJ SOLN
INTRAMUSCULAR | Status: AC
  Filled 2018-05-25: qty 2

## 2018-05-25 MED ORDER — SODIUM CHLORIDE 0.9 % IJ SOLN
INTRAMUSCULAR | Status: DC | PRN
  Administered 2018-05-25: 30 mL via INTRAVENOUS

## 2018-05-25 MED ORDER — BUPIVACAINE-EPINEPHRINE 0.5% -1:200000 IJ SOLN
INTRAMUSCULAR | Status: AC
  Filled 2018-05-25: qty 1

## 2018-05-25 MED ORDER — CEFAZOLIN SODIUM-DEXTROSE 2-4 GM/100ML-% IV SOLN
2.0000 g | INTRAVENOUS | Status: AC
  Administered 2018-05-25: 2 g via INTRAVENOUS
  Filled 2018-05-25: qty 100

## 2018-05-25 MED ORDER — FENTANYL CITRATE (PF) 100 MCG/2ML IJ SOLN
INTRAMUSCULAR | Status: AC
  Filled 2018-05-25: qty 2

## 2018-05-25 MED ORDER — SENNA 8.6 MG PO TABS
1.0000 | ORAL_TABLET | Freq: Two times a day (BID) | ORAL | Status: DC
  Administered 2018-05-25 – 2018-05-27 (×3): 8.6 mg via ORAL
  Filled 2018-05-25 (×4): qty 1

## 2018-05-25 MED ORDER — CHLORHEXIDINE GLUCONATE 4 % EX LIQD
CUTANEOUS | Status: AC
  Filled 2018-05-25: qty 15

## 2018-05-25 MED ORDER — KETAMINE HCL 50 MG/5ML IJ SOSY
PREFILLED_SYRINGE | INTRAMUSCULAR | Status: AC
  Filled 2018-05-25: qty 5

## 2018-05-25 MED ORDER — KETOROLAC TROMETHAMINE 30 MG/ML IJ SOLN
INTRAMUSCULAR | Status: DC | PRN
  Administered 2018-05-25: 30 mg via INTRAVENOUS

## 2018-05-25 MED ORDER — LACTATED RINGERS IV SOLN
INTRAVENOUS | Status: DC
  Administered 2018-05-25 (×2): via INTRAVENOUS

## 2018-05-25 MED ORDER — ENOXAPARIN SODIUM 40 MG/0.4ML ~~LOC~~ SOLN
40.0000 mg | SUBCUTANEOUS | Status: DC
  Administered 2018-05-26 – 2018-05-27 (×2): 40 mg via SUBCUTANEOUS
  Filled 2018-05-25 (×2): qty 0.4

## 2018-05-25 MED ORDER — ONDANSETRON HCL 4 MG/2ML IJ SOLN
INTRAMUSCULAR | Status: DC | PRN
  Administered 2018-05-25: 4 mg via INTRAVENOUS

## 2018-05-25 MED ORDER — KETAMINE HCL 10 MG/ML IJ SOLN
INTRAMUSCULAR | Status: DC | PRN
  Administered 2018-05-25: 15 mg via INTRAVENOUS

## 2018-05-25 MED ORDER — BUPIVACAINE-EPINEPHRINE (PF) 0.5% -1:200000 IJ SOLN
INTRAMUSCULAR | Status: DC | PRN
  Administered 2018-05-25: 50 mL

## 2018-05-25 MED ORDER — SODIUM CHLORIDE 0.9 % IR SOLN
Status: DC | PRN
  Administered 2018-05-25: 3000 mL

## 2018-05-25 MED ORDER — MENTHOL 3 MG MT LOZG: 1.0000 | LOZENGE | OROMUCOSAL | Status: DC | PRN

## 2018-05-25 MED ORDER — METOCLOPRAMIDE HCL 5 MG PO TABS: 5.0000 mg | ORAL_TABLET | Freq: Three times a day (TID) | ORAL | Status: DC | PRN

## 2018-05-25 MED ORDER — POVIDONE-IODINE 10 % EX SWAB: 2.0000 "application " | Freq: Once | CUTANEOUS | Status: DC

## 2018-05-25 MED ORDER — BUPIVACAINE HCL (PF) 0.5 % IJ SOLN
INTRAMUSCULAR | Status: DC | PRN
  Administered 2018-05-25: 1.8 mL via INTRATHECAL

## 2018-05-25 MED ORDER — METOCLOPRAMIDE HCL 5 MG/ML IJ SOLN: 5.0000 mg | Freq: Three times a day (TID) | INTRAMUSCULAR | Status: DC | PRN

## 2018-05-25 MED ORDER — DOCUSATE SODIUM 100 MG PO CAPS
100.0000 mg | ORAL_CAPSULE | Freq: Two times a day (BID) | ORAL | Status: DC
  Administered 2018-05-25 – 2018-05-27 (×3): 100 mg via ORAL
  Filled 2018-05-25 (×4): qty 1

## 2018-05-25 MED ORDER — 0.9 % SODIUM CHLORIDE (POUR BTL) OPTIME
TOPICAL | Status: DC | PRN
  Administered 2018-05-25: 1000 mL

## 2018-05-25 MED ORDER — SODIUM CHLORIDE 0.9 % IV SOLN
INTRAVENOUS | Status: AC | PRN
  Administered 2018-05-25: 500 mL via INTRAMUSCULAR

## 2018-05-25 MED ORDER — FENTANYL CITRATE (PF) 250 MCG/5ML IJ SOLN
INTRAMUSCULAR | Status: DC | PRN
  Administered 2018-05-25: 50 ug via INTRAVENOUS
  Administered 2018-05-25: 25 ug via INTRAVENOUS

## 2018-05-25 SURGICAL SUPPLY — 61 items
ALCOHOL ISOPROPYL (RUBBING) (MISCELLANEOUS) ×3 IMPLANT
BLADE CLIPPER SURG (BLADE) IMPLANT
CHLORAPREP W/TINT 26ML (MISCELLANEOUS) ×3 IMPLANT
COVER SURGICAL LIGHT HANDLE (MISCELLANEOUS) ×3 IMPLANT
DERMABOND ADHESIVE PROPEN (GAUZE/BANDAGES/DRESSINGS) ×2
DERMABOND ADVANCED (GAUZE/BANDAGES/DRESSINGS) ×4
DERMABOND ADVANCED .7 DNX12 (GAUZE/BANDAGES/DRESSINGS) ×2 IMPLANT
DERMABOND ADVANCED .7 DNX6 (GAUZE/BANDAGES/DRESSINGS) IMPLANT
DRAPE C-ARM 42X72 X-RAY (DRAPES) ×3 IMPLANT
DRAPE STERI IOBAN 125X83 (DRAPES) ×3 IMPLANT
DRAPE U-SHAPE 47X51 STRL (DRAPES) ×9 IMPLANT
DRSG AQUACEL AG ADV 3.5X 4 (GAUZE/BANDAGES/DRESSINGS) ×2 IMPLANT
DRSG AQUACEL AG ADV 3.5X10 (GAUZE/BANDAGES/DRESSINGS) ×3 IMPLANT
ELECT BLADE 4.0 EZ CLEAN MEGAD (MISCELLANEOUS) ×3
ELECT PENCIL ROCKER SW 15FT (MISCELLANEOUS) ×3 IMPLANT
ELECT REM PT RETURN 9FT ADLT (ELECTROSURGICAL) ×3
ELECTRODE BLDE 4.0 EZ CLN MEGD (MISCELLANEOUS) ×1 IMPLANT
ELECTRODE REM PT RTRN 9FT ADLT (ELECTROSURGICAL) ×1 IMPLANT
EVACUATOR 1/8 PVC DRAIN (DRAIN) IMPLANT
GLOVE BIO SURGEON STRL SZ8.5 (GLOVE) ×6 IMPLANT
GLOVE BIOGEL PI IND STRL 8.5 (GLOVE) ×1 IMPLANT
GLOVE BIOGEL PI INDICATOR 8.5 (GLOVE) ×2
GOWN STRL REUS W/ TWL LRG LVL3 (GOWN DISPOSABLE) ×2 IMPLANT
GOWN STRL REUS W/TWL 2XL LVL3 (GOWN DISPOSABLE) ×3 IMPLANT
GOWN STRL REUS W/TWL LRG LVL3 (GOWN DISPOSABLE) ×4
HANDPIECE INTERPULSE COAX TIP (DISPOSABLE) ×2
HEAD CERAMIC 36 PLUS5 (Hips) ×2 IMPLANT
HEAD CERAMIC DELTA 36 PLUS 1.5 (Hips) IMPLANT
HOOD PEEL AWAY FACE SHEILD DIS (HOOD) ×6 IMPLANT
KIT BASIN OR (CUSTOM PROCEDURE TRAY) ×3 IMPLANT
KIT TURNOVER KIT B (KITS) ×3 IMPLANT
LINER NEUTRAL 52X36MM PLUS 4 (Liner) ×2 IMPLANT
MANIFOLD NEPTUNE II (INSTRUMENTS) ×3 IMPLANT
MARKER SKIN DUAL TIP RULER LAB (MISCELLANEOUS) ×6 IMPLANT
NDL SPNL 18GX3.5 QUINCKE PK (NEEDLE) ×1 IMPLANT
NEEDLE SPNL 18GX3.5 QUINCKE PK (NEEDLE) ×3 IMPLANT
NS IRRIG 1000ML POUR BTL (IV SOLUTION) ×3 IMPLANT
PACK TOTAL JOINT (CUSTOM PROCEDURE TRAY) ×3 IMPLANT
PACK UNIVERSAL I (CUSTOM PROCEDURE TRAY) ×3 IMPLANT
PAD ARMBOARD 7.5X6 YLW CONV (MISCELLANEOUS) ×6 IMPLANT
PIN SECTOR W/GRIP ACE CUP 52MM (Hips) ×2 IMPLANT
SAW OSC TIP CART 19.5X105X1.3 (SAW) ×3 IMPLANT
SEALER BIPOLAR AQUA 6.0 (INSTRUMENTS) IMPLANT
SET HNDPC FAN SPRY TIP SCT (DISPOSABLE) ×1 IMPLANT
SOL PREP POV-IOD 4OZ 10% (MISCELLANEOUS) ×3 IMPLANT
STEM TRI LOC GRIPTION SZ 5 STD IMPLANT
SUT ETHIBOND NAB CT1 #1 30IN (SUTURE) ×6 IMPLANT
SUT MNCRL AB 3-0 PS2 18 (SUTURE) ×3 IMPLANT
SUT MON AB 2-0 CT1 36 (SUTURE) ×3 IMPLANT
SUT VIC AB 1 CT1 27 (SUTURE) ×2
SUT VIC AB 1 CT1 27XBRD ANBCTR (SUTURE) ×1 IMPLANT
SUT VIC AB 2-0 CT1 27 (SUTURE) ×2
SUT VIC AB 2-0 CT1 TAPERPNT 27 (SUTURE) ×1 IMPLANT
SUT VLOC 180 0 24IN GS25 (SUTURE) ×3 IMPLANT
SYR 50ML LL SCALE MARK (SYRINGE) ×3 IMPLANT
TOWEL OR 17X24 6PK STRL BLUE (TOWEL DISPOSABLE) ×3 IMPLANT
TOWEL OR 17X26 10 PK STRL BLUE (TOWEL DISPOSABLE) ×3 IMPLANT
TRAY CATH 16FR W/PLASTIC CATH (SET/KITS/TRAYS/PACK) IMPLANT
TRAY FOLEY CATH SILVER 16FR (SET/KITS/TRAYS/PACK) IMPLANT
TRI LOC GRIPTION SZ 5 STD ×3 IMPLANT
WATER STERILE IRR 1000ML POUR (IV SOLUTION) ×9 IMPLANT

## 2018-05-25 NOTE — Discharge Instructions (Signed)
°Dr. Adonai Selsor °Joint Replacement Specialist °[facility] °[address] °[place], [place] [address] °[phone] ° ° °TOTAL HIP REPLACEMENT POSTOPERATIVE DIRECTIONS ° ° ° °Hip Rehabilitation, Guidelines Following Surgery  ° °WEIGHT BEARING °Weight bearing as tolerated with assist device (walker, cane, etc) as directed, use it as long as suggested by your surgeon or therapist, typically at least 4-6 weeks. ° °The results of a hip operation are greatly improved after range of motion and muscle strengthening exercises. Follow all safety measures which are given to protect your hip. If any of these exercises cause increased pain or swelling in your joint, decrease the amount until you are comfortable again. Then slowly increase the exercises. Call your caregiver if you have problems or questions.  ° °HOME CARE INSTRUCTIONS  °Most of the following instructions are designed to prevent the dislocation of your new hip.  °Remove items at home which could result in a fall. This includes throw rugs or furniture in walking pathways.  °Continue medications as instructed at time of discharge. °· You may have some home medications which will be placed on hold until you complete the course of blood thinner medication. °· You may start showering once you are discharged home. Do not remove your dressing. °Do not put on socks or shoes without following the instructions of your caregivers.   °Sit on chairs with arms. Use the chair arms to help push yourself up when arising.  °Arrange for the use of a toilet seat elevator so you are not sitting low.  °· Walk with walker as instructed.  °You may resume a sexual relationship in one month or when given the OK by your caregiver.  °Use walker as long as suggested by your caregivers.  °You may put full weight on your legs and walk as much as is comfortable. °Avoid periods of inactivity such as sitting longer than an hour when not asleep. This helps prevent  blood clots.  °You may return to work once you are cleared by your surgeon.  °Do not drive a car for 6 weeks or until released by your surgeon.  °Do not drive while taking narcotics.  °Wear elastic stockings for two weeks following surgery during the day but you may remove then at night.  °Make sure you keep all of your appointments after your operation with all of your doctors and caregivers. You should call the office at the above phone number and make an appointment for approximately two weeks after the date of your surgery. °Please pick up a stool softener and laxative for home use as long as you are requiring pain medications. °· ICE to the affected hip every three hours for 30 minutes at a time and then as needed for pain and swelling. Continue to use ice on the hip for pain and swelling from surgery. You may notice swelling that will progress down to the foot and ankle.  This is normal after surgery.  Elevate the leg when you are not up walking on it.   °It is important for you to complete the blood thinner medication as prescribed by your doctor. °· Continue to use the breathing machine which will help keep your temperature down.  It is common for your temperature to cycle up and down following surgery, especially at night when you are not up moving around and exerting yourself.  The breathing machine keeps your lungs expanded and your temperature down. ° °RANGE OF MOTION AND STRENGTHENING EXERCISES  °These exercises are   designed to help you keep full movement of your hip joint. Follow your caregiver's or physical therapist's instructions. Perform all exercises about fifteen times, three times per day or as directed. Exercise both hips, even if you have had only one joint replacement. These exercises can be done on a training (exercise) mat, on the floor, on a table or on a bed. Use whatever works the best and is most comfortable for you. Use music or television while you are exercising so that the exercises  are a pleasant break in your day. This will make your life better with the exercises acting as a break in routine you can look forward to.  °Lying on your back, slowly slide your foot toward your buttocks, raising your knee up off the floor. Then slowly slide your foot back down until your leg is straight again.  °Lying on your back spread your legs as far apart as you can without causing discomfort.  °Lying on your side, raise your upper leg and foot straight up from the floor as far as is comfortable. Slowly lower the leg and repeat.  °Lying on your back, tighten up the muscle in the front of your thigh (quadriceps muscles). You can do this by keeping your leg straight and trying to raise your heel off the floor. This helps strengthen the largest muscle supporting your knee.  °Lying on your back, tighten up the muscles of your buttocks both with the legs straight and with the knee bent at a comfortable angle while keeping your heel on the floor.  ° °SKILLED REHAB INSTRUCTIONS: °If the patient is transferred to a skilled rehab facility following release from the hospital, a list of the current medications will be sent to the facility for the patient to continue.  When discharged from the skilled rehab facility, please have the facility set up the patient's Home Health Physical Therapy prior to being released. Also, the skilled facility will be responsible for providing the patient with their medications at time of release from the facility to include their pain medication and their blood thinner medication. If the patient is still at the rehab facility at time of the two week follow up appointment, the skilled rehab facility will also need to assist the patient in arranging follow up appointment in our office and any transportation needs. ° °MAKE SURE YOU:  °Understand these instructions.  °Will watch your condition.  °Will get help right away if you are not doing well or get worse. ° °Pick up stool softner and  laxative for home use following surgery while on pain medications. °Do not remove your dressing. °The dressing is waterproof--it is OK to take showers. °Continue to use ice for pain and swelling after surgery. °Do not use any lotions or creams on the incision until instructed by your surgeon. °Total Hip Protocol. ° ° °

## 2018-05-25 NOTE — Interval H&P Note (Signed)
History and Physical Interval Note:  05/25/2018 11:55 AM  Eugenia Pancoastandy Rzasa  has presented today for surgery, with the diagnosis of left hip fracture  The various methods of treatment have been discussed with the patient and family. After consideration of risks, benefits and other options for treatment, the patient has consented to  Procedure(s): TOTAL HIP ARTHROPLASTY ANTERIOR APPROACH (Left) as a surgical intervention .  The patient's history has been reviewed, patient examined, no change in status, stable for surgery.  I have reviewed the patient's chart and labs.  Questions were answered to the patient's satisfaction.    The risks, benefits, and alternatives were discussed with the patient. There are risks associated with the surgery including, but not limited to, problems with anesthesia (death), infection, instability (giving out of the joint), dislocation, differences in leg length/angulation/rotation, fracture of bones, loosening or failure of implants, hematoma (blood accumulation) which may require surgical drainage, blood clots, pulmonary embolism, nerve injury (foot drop and lateral thigh numbness), and blood vessel injury. The patient understands these risks and elects to proceed.   Iline OvenBrian J Shuntia Exton

## 2018-05-25 NOTE — Op Note (Signed)
OPERATIVE REPORT  SURGEON: Samson FredericBrian Samyra Limb, MD   ASSISTANT: Hart CarwinJustin Queen, RNFA.  PREOPERATIVE DIAGNOSIS: Displaced Left hip femoral neck fracture.   POSTOPERATIVE DIAGNOSIS: Displaced Left hip femoral neck fracture.    PROCEDURE: Left total hip arthroplasty, anterior approach.   IMPLANTS: DePuy Tri Lock stem, size 5, std offset. DePuy Pinnacle Cup, size 52 mm. DePuy Altrx liner, size 36 by 52 mm, +4 neutral. DePuy Biolox ceramic head ball, size 36 + 5 mm.  ANESTHESIA:  Spinal  ESTIMATED BLOOD LOSS:-300 mL    ANTIBIOTICS: 2 g Ancef.  DRAINS: None.  COMPLICATIONS: None.   CONDITION: PACU - hemodynamically stable.   BRIEF CLINICAL NOTE: Joel Simmons is a 62 y.o. male with a displaced Left femoral neck fracture. After preoperative cardiology evaluation, the patient was indicated for total hip arthroplasty. The risks, benefits, and alternatives to the procedure were explained, and the patient elected to proceed.  PROCEDURE IN DETAIL: Surgical site was marked by myself in the pre-op holding area. Once inside the operating room, spinal anesthesia was obtained, and a foley catheter was inserted. The patient was then positioned on the Hana table. All bony prominences were well padded. The hip was prepped and draped in the normal sterile surgical fashion. A time-out was called verifying side and site of surgery. The patient received IV antibiotics within 60 minutes of beginning the procedure.  The direct anterior approach to the hip was performed through the Hueter interval. Lateral femoral circumflex vessels were treated with the Auqumantys. The anterior capsule was exposed and an inverted T capsulotomy was made. The fracture hematoma was encountered and evacuated.  There is a comminuted mid cervical femoral neck fracture.  The femoral neck cut was made to the level of the templated cut. A corkscrew was placed into the head and the head was removed. The femoral head was found  to have eburnated bone. The head was passed to the back table and was measured.  Acetabular exposure was achieved, and the pulvinar and labrum were excised. Sequential reaming of the acetabulum was then performed up to a size 51 mm reamer. A 52 mm cup was then opened and impacted into place at approximately 40 degrees of abduction and 20 degrees of anteversion. The final polyethylene liner was impacted into place and acetabular osteophytes were removed.   I then gained femoral exposure taking care to protect the abductors and greater trochanter. This was performed using standard external rotation, extension, and adduction. The capsule was peeled off the inner aspect of the greater trochanter, taking care to preserve the short external rotators. A cookie cutter was used to enter the femoral canal, and then the femoral canal finder was placed. Sequential broaching was performed up to a size 5. Calcar planer was used on the femoral neck remnant. I placed a std offset neck and a trial head ball. The hip was reduced. Leg lengths and offset were checked fluoroscopically. The hip was dislocated and trial components were removed. The final implants were placed, and the hip was reduced.  Fluoroscopy was used to confirm component position and leg lengths. At 90 degrees of external rotation and full extension, the hip was stable to an anterior directed force.  The wound was copiously irrigated with normal saline using pulse lavage. Marcaine solution was injected into the periarticular soft tissue. The wound was closed in layers using #1 Vicryl and V-Loc for the fascia, 2-0 Vicryl for the subcutaneous fat, 2-0 Monocryl for the deep dermal layer, 3-0 running Monocryl subcuticular stitch,  and Dermabond for the skin. Once the glue was fully dried, an Aquacell Ag dressing was applied. The patient was transported to the recovery room in stable condition. Sponge, needle, and instrument counts were correct at  the end of the case x2. The patient tolerated the procedure well and there were no known complications.  Postoperatively, the patient will be readmitted to the hospitalist.  He may weight-bear as tolerated with a walker.  Begin Lovenox in-house for DVT prophylaxis, and discharged home on aspirin 81 mg p.o. twice daily for 6 weeks.  He will work with physical therapy.  Plan for discharge home with home health physical therapy.  Return to the office in 2 weeks for routine postoperative care.

## 2018-05-25 NOTE — Progress Notes (Signed)
PROGRESS NOTE    Joel PancoastRandy Warrell  EAV:[id number] DOB: [birth year] DOA: 05/23/2018 PCP: Roderick Peeodd, Jeffrey A, MD      Brief Narrative:  [name] is a 62 y.o. M with COPD, chronic respiratory failure, noncompliant with home O2, alcohol use disorder and ?Bipolar who presents with syncope and LEFT hip fracture.     Assessment & Plan:  Left hip fracture To OR today Uncomplicated anterior approach hemiarthroplasty Lovenox to aspirin PT  WBAT    Syncope Telemetry unremarkable.  Seen by Dr. Graciela HusbandsKlein, suspect syncope was vagally mediated.  Echo done today, shows normal EF, normal wall thickness.  COPD with mild exacerbation Chronic respiratory failure -Continue steroids -Continue bronchodilators -Continue Dulera, Singulair  Alcohol use disorder Does not appear to be withdrwaing -Continue CIWA -Continue thiamine, folate  Doubt chronic congestive heart failure Family report he has been diagnosed in past with CHF.  His EF is normal, no diastolic dysfunction on echo here.  Furthermore, does not take daily diuretic, nor has ever had congestive episode requiring diuretics either in or outpatient.  Other medications -Continue citalopram  Leukocytosis Likely reactive.        DVT prophylaxis: Lovenox Code Status: FULL Family Communication: Family at bedside MDM and disposition Plan: The below labs and imaging reports were reviewed and summarized above.  Medication mgmt as above.  The patient was admitted with LEFT hip fracture, mild COPD flare.  To OR today.  WBAT will work with PT tomorrow, then likely home on Thursday.   Consultants:   Orthopedics  Cardiology  Procedures:   Echo  Antimicrobials:   None    Subjective: Very pleased that he can lift his left leg now.  Other than moderate left hip pain, he has no chest pain, dyspnea, palpitations.  No fever, cough, sputum.  Objective: Vitals:   05/25/18 1531 05/25/18 1602 05/25/18 1616 05/25/18 1651  BP: 98/66  95/64 103/65 102/66  Pulse: (!) 46   66  Resp: 17 16 18 19   Temp:    98.8 F (37.1 C)  TempSrc:    Oral  SpO2: 98%   96%  Weight:      Height:        Intake/Output Summary (Last 24 hours) at 05/25/2018 1855 Last data filed at 05/25/2018 1457 Gross per 24 hour  Intake 1560 ml  Output 1000 ml  Net 560 ml   Filed Weights   05/23/18 1557 05/24/18 0405 05/25/18 0646  Weight: 72 kg 73.9 kg 71.1 kg    Examination: General appearance: Thin adult male, lying in bed, interactive, no acute distress, conversational..   HEENT: Anicteric, conjunctival pink, lids and lashes normal.  No nasal deformity, discharge, epistaxis.  Lips moist, dentition, no oral lesions, oropharynx tacky dry, hearing normal. Skin: Skin is warm and dry without jaundice or suspicious rashes or lesions. Cardiac: Regular rate and rhythm, no murmurs appreciated, JVP normal, no lower extremity edema. Respiratory: Normal respiratory rate and effort.  Wheezing throughout, no rales.. Abdomen: Abdomen soft without tenderness to palpation or guarding.  No ascites or distention. MSK: No deformities or effusions of the large joints of the upper lower extremities bilaterally.  Incisional scar not appreciated. Neuro: Awake and alert, extraocular movements intact, moves both upper and lower extremities with strength, symmetric strength, left leg limited by pain, speech fluent. Psych: Sensorium intact responding to questions, attention normal.  Affect normal.  Judgment and insight appear normal.    Data Reviewed: I have personally reviewed following labs and imaging studies:  CBC:  Recent Labs  Lab 05/23/18 0927 05/23/18 2353 05/25/18 0722  WBC 15.4* 10.7* 12.6*  NEUTROABS 12.6*  --   --   HGB 14.8 13.5 12.3*  HCT 44.2 39.2 36.5*  MCV 104.5* 104.3* 104.9*  PLT 273 236 232   Basic Metabolic Panel: Recent Labs  Lab 05/23/18 0927 05/23/18 2353 05/25/18 0722  NA 134* 137 140  K 4.3 4.3 3.8  CL 98 100 102  CO2 26 30 32    GLUCOSE 113* 157* 93  BUN 11 10 17   CREATININE 0.54* <0.30* 0.53*  CALCIUM 8.9 8.7* 8.8*   GFR: Estimated Creatinine Clearance: 98.2 mL/min (A) (by C-G formula based on SCr of 0.53 mg/dL (L)). Liver Function Tests: No results for input(s): AST, ALT, ALKPHOS, BILITOT, PROT, ALBUMIN in the last 168 hours. No results for input(s): LIPASE, AMYLASE in the last 168 hours. No results for input(s): AMMONIA in the last 168 hours. Coagulation Profile: Recent Labs  Lab 05/23/18 0927  INR 0.95   Cardiac Enzymes: Recent Labs  Lab 05/23/18 1216 05/23/18 1726 05/23/18 2353  TROPONINI <0.03 <0.03 <0.03   BNP (last 3 results) No results for input(s): PROBNP in the last 8760 hours. HbA1C: No results for input(s): HGBA1C in the last 72 hours. CBG: No results for input(s): GLUCAP in the last 168 hours. Lipid Profile: No results for input(s): CHOL, HDL, LDLCALC, TRIG, CHOLHDL, LDLDIRECT in the last 72 hours. Thyroid Function Tests: Recent Labs    05/23/18 1619  TSH 2.061   Anemia Panel: No results for input(s): VITAMINB12, FOLATE, FERRITIN, TIBC, IRON, RETICCTPCT in the last 72 hours. Urine analysis:    Component Value Date/Time   COLORURINE yellow 03/27/2010 1542   APPEARANCEUR Clear 03/27/2010 1542   LABSPEC >=1.030 03/27/2010 1542   PHURINE 5.5 03/27/2010 1542   HGBUR negative 03/27/2010 1542   BILIRUBINUR 1+ 03/27/2010 1542   UROBILINOGEN 2.0 03/27/2010 1542   NITRITE negative 03/27/2010 1542   Sepsis Labs: @LABRCNTIP (procalcitonin:4,lacticacidven:4)  ) Recent Results (from the past 240 hour(s))  MRSA PCR Screening     Status: None   Collection Time: 05/24/18  7:15 AM  Result Value Ref Range Status   MRSA by PCR NEGATIVE NEGATIVE Final    Comment:        The GeneXpert MRSA Assay (FDA approved for NASAL specimens only), is one component of a comprehensive MRSA colonization surveillance program. It is not intended to diagnose MRSA infection nor to guide or monitor  treatment for MRSA infections. Performed at [facility], [address], [place], [place] [address]          Radiology Studies: Dg Ribs Unilateral Right  Result Date: 05/24/2018 CLINICAL DATA:  Possible rib fractures. EXAM: RIGHT RIBS - 2 VIEW COMPARISON:  Chest x-ray earlier today as well as 03/23/2018 FINDINGS: Possible subtle fracture over the anterior aspect of the right tenth rib. Remainder the exam is unchanged. IMPRESSION: Possible subtle anterior right tenth rib fracture. Electronically Signed   By: Elberta Fortis M.D.   On: 05/24/2018 15:06   Pelvis Portable  Result Date: 05/25/2018 CLINICAL DATA:  Status post left hip replacement today. EXAM: PORTABLE PELVIS 1-2 VIEWS COMPARISON:  Intraoperative imaging earlier today. 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 The device is located. No fracture. Surgical drain is noted. IMPRESSION: Status post left hip replacement.  No acute finding. Electronically Signed   By: Drusilla Kanner M.D.   On: 05/25/2018 15:53   Dg C-arm 1-60 Min  Result Date:  05/25/2018 CLINICAL DATA:  Left total hip arthroplasty with anterior approach EXAM: DG C-ARM 61-120 MIN COMPARISON:  05/23/2018 left hip radiographs FINDINGS: Fluoroscopy time 0 minutes 17 seconds. Three nondiagnostic spot fluoroscopic intraoperative radiographs demonstrate postsurgical changes from left total hip arthroplasty, with appropriate positioning of the left hip hardware on these views. IMPRESSION: Intraoperative fluoroscopic guidance for left total hip arthroplasty. Electronically Signed   By: Delbert PhenixJason A Poff M.D.   On: 05/25/2018 14:38   Dg Hip Operative Unilat W Or W/o Pelvis Left  Result Date: 05/25/2018 CLINICAL DATA:  Left femoral neck fracture. EXAM: OPERATIVE LEFT HIP (WITH PELVIS IF PERFORMED)  VIEWS TECHNIQUE: Fluoroscopic spot image(s) were submitted for interpretation post-operatively. COMPARISON:  Radiographs dated 05/23/2018 FINDINGS: AP C-arm images demonstrate  the patient has undergone left total hip prosthesis insertion. The components appear in excellent position in the AP projection. IMPRESSION: Left total hip prosthesis appears in good position in the AP projection. Electronically Signed   By: Francene BoyersJames  Maxwell M.D.   On: 05/25/2018 14:36        Scheduled Meds: . chlorhexidine      . citalopram  20 mg Oral Daily  . docusate sodium  100 mg Oral BID  . [START ON 05/26/2018] enoxaparin (LOVENOX) injection  40 mg Subcutaneous Q24H  . fentaNYL      . folic acid  1 mg Oral Daily  . ipratropium-albuterol  3 mL Nebulization TID  . midazolam      . mometasone-formoterol  1 puff Inhalation BID  . montelukast  10 mg Oral Daily  . multivitamin with minerals  1 tablet Oral Daily  . predniSONE  40 mg Oral Q breakfast  . senna  1 tablet Oral BID  . thiamine  100 mg Oral Daily   Or  . thiamine  100 mg Intravenous Daily   Continuous Infusions: .  ceFAZolin (ANCEF) IV 2 g (05/25/18 1834)  . lactated ringers 10 mL/hr at 05/25/18 1118     LOS: 2 days    Time spent: 25 minutes    Alberteen Samhristopher P Stephens Shreve, MD [facility] 05/25/2018, 6:55 PM     Pager [phone] --- please page though AMION:  [email or web address] Password [email or web address] If 7PM-7AM, please contact night-coverage

## 2018-05-25 NOTE — Progress Notes (Addendum)
Progress Note  Patient Name: Joel Simmons Date of Encounter: 05/25/2018  Primary Cardiologist: Graciela Husbands  Subjective   Pt feels good today. No chest pain or palpitations. Mild pain in left hip. Awaiting surgery pending cards clearance.   Inpatient Medications    Scheduled Meds: . chlorhexidine  60 mL Topical Once  . citalopram  20 mg Oral Daily  . folic acid  1 mg Oral Daily  . ipratropium-albuterol  3 mL Nebulization TID  . mometasone-formoterol  1 puff Inhalation BID  . montelukast  10 mg Oral Daily  . multivitamin with minerals  1 tablet Oral Daily  . povidone-iodine  2 application Topical Once  . predniSONE  40 mg Oral Q breakfast  . thiamine  100 mg Oral Daily   Or  . thiamine  100 mg Intravenous Daily   Continuous Infusions: . sodium chloride    . tranexamic acid     PRN Meds: acetaminophen **OR** acetaminophen, albuterol, LORazepam **OR** LORazepam, ondansetron **OR** ondansetron (ZOFRAN) IV, oxyCODONE, polyethylene glycol   Vital Signs    Vitals:   05/24/18 1409 05/24/18 1520 05/24/18 2146 05/25/18 0646  BP: 119/74  131/75 122/70  Pulse: 78  90 81  Resp: 20  (!) 29 (!) 22  Temp: 97.6 F (36.4 C)  98.2 F (36.8 C) 98 F (36.7 C)  TempSrc: Oral  Oral Oral  SpO2: 96% 92% 97% 97%  Weight:    71.1 kg  Height:        Intake/Output Summary (Last 24 hours) at 05/25/2018 0808 Last data filed at 05/24/2018 2145 Gross per 24 hour  Intake 840 ml  Output -  Net 840 ml   Filed Weights   05/23/18 1557 05/24/18 0405 05/25/18 0646  Weight: 72 kg 73.9 kg 71.1 kg   Physical Exam   General: Well developed, well nourished, NAD Skin: Warm, dry, intact  Head: Normocephalic, atraumatic, clear, moist mucus membranes. Neck: Negative for carotid bruits. No JVD Lungs: Rhonchus bilaterally. No wheezes. Breathing is unlabored. Cardiovascular: RRR with S1 S2. No murmurs, rubs or gallops Abdomen: Soft, non-tender, non-distended with normoactive bowel sounds. No obvious  abdominal masses. MSK: Strength and tone appear normal for age. 5/5 in all extremities Extremities: No edema. No clubbing or cyanosis. DP/PT pulses 2+ bilaterally Neuro: Alert and oriented. No focal deficits. No facial asymmetry. MAE spontaneously. Psych: Responds to questions appropriately with normal affect.    Labs    Chemistry Recent Labs  Lab 05/23/18 0927 05/23/18 2353  NA 134* 137  K 4.3 4.3  CL 98 100  CO2 26 30  GLUCOSE 113* 157*  BUN 11 10  CREATININE 0.54* <0.30*  CALCIUM 8.9 8.7*  GFRNONAA >60 NOT CALCULATED  GFRAA >60 NOT CALCULATED  ANIONGAP 10 7     Hematology Recent Labs  Lab 05/23/18 0927 05/23/18 2353  WBC 15.4* 10.7*  RBC 4.23 3.76*  HGB 14.8 13.5  HCT 44.2 39.2  MCV 104.5* 104.3*  MCH 35.0* 35.9*  MCHC 33.5 34.4  RDW 14.4 14.2  PLT 273 236    Cardiac Enzymes Recent Labs  Lab 05/23/18 1216 05/23/18 1726 05/23/18 2353  TROPONINI <0.03 <0.03 <0.03   No results for input(s): TROPIPOC in the last 168 hours.   BNP Recent Labs  Lab 05/23/18 1619  BNP 62.1    DDimer No results for input(s): DDIMER in the last 168 hours.   Radiology    Dg Chest 1 View  Result Date: 05/23/2018 CLINICAL DATA:  Recent  fall, left hip fracture EXAM: CHEST  1 VIEW COMPARISON:  05/23/2018, 03/23/2018 FINDINGS: The heart size and mediastinal contours are within normal limits. Both lungs are clear. The visualized skeletal structures are unremarkable. IMPRESSION: No active disease. Electronically Signed   By: Judie PetitM.  Shick M.D.   On: 05/23/2018 10:04   Dg Ribs Unilateral Right  Result Date: 05/24/2018 CLINICAL DATA:  Possible rib fractures. EXAM: RIGHT RIBS - 2 VIEW COMPARISON:  Chest x-ray earlier today as well as 03/23/2018 FINDINGS: Possible subtle fracture over the anterior aspect of the right tenth rib. Remainder the exam is unchanged. IMPRESSION: Possible subtle anterior right tenth rib fracture. Electronically Signed   By: Elberta Fortisaniel  Boyle M.D.   On: 05/24/2018  15:06   Ct Head Wo Contrast  Result Date: 05/23/2018 CLINICAL DATA:  62 year old male with acute head and neck injury EXAM: CT HEAD WITHOUT CONTRAST CT CERVICAL SPINE WITHOUT CONTRAST TECHNIQUE: Multidetector CT imaging of the head and cervical spine was performed following the standard protocol without intravenous contrast. Multiplanar CT image reconstructions of the cervical spine were also generated. COMPARISON:  03/23/2018 CT FINDINGS: CT HEAD FINDINGS Brain: No evidence of acute infarction, hemorrhage, hydrocephalus, extra-axial collection or mass lesion/mass effect. Minimal generalized cerebral volume loss again noted. Vascular: Carotid atherosclerotic calcifications again noted. Skull: Normal. Negative for fracture or focal lesion. Sinuses/Orbits: No acute finding. Other: None. CT CERVICAL SPINE FINDINGS Alignment: Normal. Skull base and vertebrae: No acute fracture. No primary bone lesion or focal pathologic process. Soft tissues and spinal canal: No prevertebral fluid or swelling. No visible canal hematoma. Disc levels: Moderate degenerative disc disease/spondylosis from C3-C7 again noted. Upper chest: Negative. Other: None IMPRESSION: 1. No evidence of acute intracranial abnormality. 2. No static evidence of acute injury to the cervical spine. 3. Moderate degenerative disc disease/spondylosis from C3-C7. Electronically Signed   By: Harmon PierJeffrey  Hu M.D.   On: 05/23/2018 11:19   Ct Cervical Spine Wo Contrast  Result Date: 05/23/2018 CLINICAL DATA:  62 year old male with acute head and neck injury EXAM: CT HEAD WITHOUT CONTRAST CT CERVICAL SPINE WITHOUT CONTRAST TECHNIQUE: Multidetector CT imaging of the head and cervical spine was performed following the standard protocol without intravenous contrast. Multiplanar CT image reconstructions of the cervical spine were also generated. COMPARISON:  03/23/2018 CT FINDINGS: CT HEAD FINDINGS Brain: No evidence of acute infarction, hemorrhage, hydrocephalus,  extra-axial collection or mass lesion/mass effect. Minimal generalized cerebral volume loss again noted. Vascular: Carotid atherosclerotic calcifications again noted. Skull: Normal. Negative for fracture or focal lesion. Sinuses/Orbits: No acute finding. Other: None. CT CERVICAL SPINE FINDINGS Alignment: Normal. Skull base and vertebrae: No acute fracture. No primary bone lesion or focal pathologic process. Soft tissues and spinal canal: No prevertebral fluid or swelling. No visible canal hematoma. Disc levels: Moderate degenerative disc disease/spondylosis from C3-C7 again noted. Upper chest: Negative. Other: None IMPRESSION: 1. No evidence of acute intracranial abnormality. 2. No static evidence of acute injury to the cervical spine. 3. Moderate degenerative disc disease/spondylosis from C3-C7. Electronically Signed   By: Harmon PierJeffrey  Hu M.D.   On: 05/23/2018 11:19   Dg Knee Left Port  Result Date: 05/23/2018 CLINICAL DATA:  Left femoral neck fracture. EXAM: PORTABLE LEFT KNEE - 1-2 VIEW COMPARISON:  None. FINDINGS: No evidence of fracture, dislocation, or joint effusion. No evidence of arthropathy or other focal bone abnormality. Vascular calcifications. Soft tissues are unremarkable. IMPRESSION: Negative. Electronically Signed   By: Obie DredgeWilliam T Derry M.D.   On: 05/23/2018 11:54   Dg Hip Unilat With  Pelvis 2-3 Views Left  Result Date: 05/23/2018 CLINICAL DATA:  Left hip pain after fall. EXAM: DG HIP (WITH OR WITHOUT PELVIS) 2-3V LEFT COMPARISON:  None. FINDINGS: Acute, impacted fracture of the left femoral neck. No dislocation. The hip joint spaces are relatively preserved. Soft tissues are unremarkable. IMPRESSION: Acute left femoral neck fracture. Electronically Signed   By: Obie Dredge M.D.   On: 05/23/2018 10:04   Telemetry    05/25/18 NSR with frequent PAC's/PVC's and incomplete RBBB - Personally Reviewed  ECG    05/23/2018 NSR with incomplete RBBB- Personally Reviewed  Cardiac Studies    Echocardiogram 05/24/2018: Study Conclusions  - Left ventricle: The cavity size was normal. Wall thickness was   normal. Systolic function was normal. The estimated ejection   fraction was in the range of 60% to 65%. Wall motion was normal;   there were no regional wall motion abnormalities. Left   ventricular diastolic function parameters were normal. - Aortic valve: Poorly visualized. Mildly calcified annulus. - Atrial septum: No defect or patent foramen ovale was identified.  Patient Profile     62 y.o. male with a past medical history of oxygen dependent COPD, bipolar disorder, chronic alcohol use who fell without warning and broke his hip.  Assessment & Plan    1. Syncope with subsequent fractured hip: -Patient presented with 2 episodes of syncopal episode (after heavy drinking) with LOC and subsequent L hip fracture -Per EP consult, questionable SVT or RBBB versus reflex mediated>>with thoughts that his syncope has no cardiac cause  -EKG with incomplete RBBB -No arrhthymias or pauses per tele review  -Echocardiogram with LVEF 60 to 65% with normal wall motion and no structural cardiac defect as cause -Telemetry, remarkable -Will likely proceed with surgical intervention for L hip fx>>>continue tele monitoring>>consider OP monitor at discharge  -Encourage alcohol cessation and increase water intake    2. COPD with oxygen dependence: -Home O2>>2L  -IV steroids x1 day, then prednisone daily -Scheduled and PRN bronchodilators -Per primary team  3. Alcohol and tobacco abuse: -CIWA protocol per internal medicine -No s/s of WD  4.  Left femoral neck fracture: -Per Ortho, awaiting internal medicine and cardiology clearance prior to OR -Echocardiogram with normal LVEF and no structural abnormalities to initiate syncopal episode    Signed, Georgie Chard NP-C [facility] Pager: [phone] 05/25/2018, 8:08 AM     For questions or updates, please contact   Please consult  [email or web address] for contact info under Cardiology/STEMI.  Attending Note:   The patient was seen and examined.  Agree with assessment and plan as noted above.  Changes made to the above note as needed.  Patient seen and independently examined with Georgie Chard, NP .   We discussed all aspects of the encounter. I agree with the assessment and plan as stated above.  1.  Syncope: Patient's symptoms do not sound cardiac.  He has not had any arrhythmias on the monitor.  Most likely mechanism is orthostatic hypotension.  The patient drinks moderate to heavy amount of alcohol does not eat regularly.  Echocardiogram shows normal left ventricular systolic function with an ejection fraction of 60 to 65%.  He is at low risk for his upcoming hip surgery. He should follow-up with his primary medical doctor.  [facility] will sign off.   Medication Recommendations:  Follow up with his primary MD  Other recommendations (labs, testing, etc):   Follow up as an outpatient:  Primary MD     I have spent a  total of 40 minutes with patient reviewing hospital  notes , telemetry, EKGs, labs and examining patient as well as establishing an assessment and plan that was discussed with the patient. > 50% of time was spent in direct patient care.    Vesta MixerPhilip J. Nahser, Montez HagemanJr., MD, [facility]eta[facility] 05/25/2018, 9:15 AM [address],  [address] Office [phone] Pager [phone]

## 2018-05-25 NOTE — Anesthesia Preprocedure Evaluation (Signed)
Anesthesia Evaluation  Patient identified by MRN, date of birth, ID band Patient awake    Reviewed: Allergy & Precautions, NPO status , Patient's Chart, lab work & pertinent test results  Airway Mallampati: II  TM Distance: >3 FB Neck ROM: Full    Dental no notable dental hx. (+) Dental Advisory Given, Poor Dentition, Chipped, Missing   Pulmonary asthma , COPD, Current Smoker,    Pulmonary exam normal breath sounds clear to auscultation       Cardiovascular +CHF  Normal cardiovascular exam Rhythm:Regular Rate:Normal  Echo 8/18?2019 Left ventricle: The cavity size was normal. Wall thickness was   normal. Systolic function was normal. The estimated ejection   fraction was in the range of 60% to 65%. Wall motion was normal;   there were no regional wall motion abnormalities. Left   ventricular diastolic function parameters were normal. - Aortic valve: Poorly visualized. Mildly calcified annulus. - Atrial septum: No defect or patent foramen ovale was identified.   Neuro/Psych    GI/Hepatic Neg liver ROS,   Endo/Other  negative endocrine ROS  Renal/GU negative Renal ROS     Musculoskeletal negative musculoskeletal ROS (+)   Abdominal   Peds  Hematology negative hematology ROS (+)   Anesthesia Other Findings   Reproductive/Obstetrics                            This SmartLink has not been configured with any valid records.    Lab Results  Component Value Date   CREATININE 0.53 (L) 05/25/2018   BUN 17 05/25/2018   NA 140 05/25/2018   K 3.8 05/25/2018   CL 102 05/25/2018   CO2 32 05/25/2018    Lab Results  Component Value Date   WBC 12.6 (H) 05/25/2018   HGB 12.3 (L) 05/25/2018   HCT 36.5 (L) 05/25/2018   MCV 104.9 (H) 05/25/2018   PLT 232 05/25/2018    Anesthesia Physical Anesthesia Plan  ASA: III  Anesthesia Plan: Spinal   Post-op Pain Management:    Induction:   PONV  Risk Score and Plan: Treatment may vary due to age or medical condition  Airway Management Planned: Natural Airway and Nasal Cannula  Additional Equipment:   Intra-op Plan:   Post-operative Plan:   Informed Consent: I have reviewed the patients History and Physical, chart, labs and discussed the procedure including the risks, benefits and alternatives for the proposed anesthesia with the patient or authorized representative who has indicated his/her understanding and acceptance.   Dental advisory given  Plan Discussed with: CRNA  Anesthesia Plan Comments:         Anesthesia Quick Evaluation

## 2018-05-25 NOTE — Progress Notes (Signed)
Brief cardiology update: No significant arrhythmias or pauses noted on telemetry. He has sinus rhythm/sinus arrhythmia on the monitor.   Echo reviewed, no evidence for structural cardiac defect as cause.  Syncope etiology per Dr. Graciela HusbandsKlein thought to be reflex mediates, no evidence of cardiac cause.   We will evaluate tomorrow to see if there is any further evidence of arrhythmia, but if no significant rhythm abnormalities we will likely sign off.  Joel RedBridgette Jalena Vanderlinden, MD, PhD [facility]  [facility]  [address], [address] [place], [place] [address] [phone]

## 2018-05-25 NOTE — Transfer of Care (Signed)
Immediate Anesthesia Transfer of Care Note  Patient: Joel PancoastRandy Konitzer  Procedure(s) Performed: TOTAL HIP ARTHROPLASTY ANTERIOR APPROACH (Left Hip)  Patient Location: PACU  Anesthesia Type:Spinal  Level of Consciousness: awake, alert  and oriented  Airway & Oxygen Therapy: Patient Spontanous Breathing and Patient connected to nasal cannula oxygen  Post-op Assessment: Report given to RN and Post -op Vital signs reviewed and stable  Post vital signs: Reviewed and stable  Last Vitals:  Vitals Value Taken Time  BP    Temp    Pulse 53 05/25/2018  2:31 PM  Resp 20 05/25/2018  2:31 PM  SpO2 89 % 05/25/2018  2:31 PM  Vitals shown include unvalidated device data.  Last Pain:  Vitals:   05/25/18 0646  TempSrc: Oral  PainSc:       Patients Stated Pain Goal: 2 (05/23/18 2200)  Complications: No apparent anesthesia complications

## 2018-05-25 NOTE — Anesthesia Postprocedure Evaluation (Signed)
Anesthesia Post Note  Patient: Joel PancoastRandy Bihm  Procedure(s) Performed: TOTAL HIP ARTHROPLASTY ANTERIOR APPROACH (Left Hip)     Patient location during evaluation: PACU Anesthesia Type: Spinal Level of consciousness: oriented and awake and alert Pain management: pain level controlled Vital Signs Assessment: post-procedure vital signs reviewed and stable Respiratory status: spontaneous breathing, respiratory function stable and patient connected to nasal cannula oxygen Cardiovascular status: blood pressure returned to baseline and stable Postop Assessment: no headache, no backache and no apparent nausea or vomiting Anesthetic complications: no    Last Vitals:  Vitals:   05/25/18 1516 05/25/18 1531  BP: 99/66 98/66  Pulse: 63 (!) 46  Resp: 17 17  Temp:    SpO2: 95% 98%    Last Pain:  Vitals:   05/25/18 1530  TempSrc:   PainSc: 0-No pain                 [name] DAVID

## 2018-05-25 NOTE — Anesthesia Procedure Notes (Addendum)
Spinal  Patient location during procedure: OR Start time: 05/25/2018 12:19 PM End time: 05/25/2018 12:23 PM Staffing Anesthesiologist: Trevor IhaHouser, Stephen A, MD Performed: anesthesiologist  Preanesthetic Checklist Completed: patient identified, surgical consent, pre-op evaluation, timeout performed, IV checked, risks and benefits discussed and monitors and equipment checked Spinal Block Patient position: right lateral decubitus Prep: site prepped and draped and DuraPrep Patient monitoring: heart rate, cardiac monitor, continuous pulse ox and blood pressure Approach: midline Location: L3-4 Injection technique: single-shot Needle Needle type: Pencan  Needle gauge: 24 G Needle length: 10 cm Needle insertion depth: 6 cm Assessment Sensory level: T4 Additional Notes 1 attempt . Patient tolerated procedure well

## 2018-05-26 ENCOUNTER — Encounter (HOSPITAL_COMMUNITY): Payer: Self-pay | Admitting: *Deleted

## 2018-05-26 LAB — CBC
HCT: 31.1 % — ABNORMAL LOW (ref 39.0–52.0)
Hemoglobin: 10.2 g/dL — ABNORMAL LOW (ref 13.0–17.0)
MCH: 34.9 pg — AB (ref 26.0–34.0)
MCHC: 32.8 g/dL (ref 30.0–36.0)
MCV: 106.5 fL — ABNORMAL HIGH (ref 78.0–100.0)
PLATELETS: 215 10*3/uL (ref 150–400)
RBC: 2.92 MIL/uL — ABNORMAL LOW (ref 4.22–5.81)
RDW: 14.1 % (ref 11.5–15.5)
WBC: 9.7 10*3/uL (ref 4.0–10.5)

## 2018-05-26 LAB — BASIC METABOLIC PANEL
Anion gap: 7 (ref 5–15)
BUN: 16 mg/dL (ref 6–20)
CALCIUM: 8.4 mg/dL — AB (ref 8.9–10.3)
CO2: 31 mmol/L (ref 22–32)
CREATININE: 0.48 mg/dL — AB (ref 0.61–1.24)
Chloride: 100 mmol/L (ref 98–111)
GFR calc Af Amer: >60 mL/min (ref >60)
GFR calc non Af Amer: >60 mL/min (ref >60)
GLUCOSE: 100 mg/dL — AB (ref 70–99)
Potassium: 4 mmol/L (ref 3.5–5.1)
Sodium: 138 mmol/L (ref 135–145)

## 2018-05-26 NOTE — Progress Notes (Signed)
    Subjective:  Patient reports pain as mild to moderate.  Denies N/V/CP/SOB. No c/o.  Objective:   VITALS:   Vitals:   05/25/18 2015 05/25/18 2141 05/26/18 0212 05/26/18 0616  BP:  106/60 100/72 101/61  Pulse:  70 80 73  Resp:  16 16 17   Temp:  97.9 F (36.6 C) 98.3 F (36.8 C) 97.6 F (36.4 C)  TempSrc:  Oral Oral Oral  SpO2: 97% 95% 98% 95%  Weight:      Height:        NAD ABD soft Sensation intact distally Intact pulses distally Dorsiflexion/Plantar flexion intact Incision: dressing C/D/I Compartment soft   Lab Results  Component Value Date   WBC 9.7 05/26/2018   HGB 10.2 (L) 05/26/2018   HCT 31.1 (L) 05/26/2018   MCV 106.5 (H) 05/26/2018   PLT 215 05/26/2018   BMET    Component Value Date/Time   NA 138 05/26/2018 0517   K 4.0 05/26/2018 0517   CL 100 05/26/2018 0517   CO2 31 05/26/2018 0517   GLUCOSE 100 (H) 05/26/2018 0517   BUN 16 05/26/2018 0517   CREATININE 0.48 (L) 05/26/2018 0517   CALCIUM 8.4 (L) 05/26/2018 0517   GFRNONAA >60 05/26/2018 0517   GFRAA >60 05/26/2018 0517     Assessment/Plan: 1 Day Post-Op   Active Problems:   Bipolar disorder (HCC)   Allergic rhinitis   Asthma   Closed left hip fracture, initial encounter (HCC)   COPD (chronic obstructive pulmonary disease) (HCC)   Alcohol abuse with intoxication (HCC)   Syncope   Closed displaced fracture of left femoral neck (HCC)   WBAT with walker DVT ppx: Lovenox --> ASA, SCDs, TEDS PO pain control PT/OT Dispo: D/C planning, likely home with HHPT   Joel Simmons 05/26/2018, 12:35 PM   Joel FredericBrian Christol Thetford, MD Cell [phone]

## 2018-05-26 NOTE — Evaluation (Signed)
Physical Therapy Evaluation Patient Details Name: Joel PancoastRandy Simmons MRN: [id number] DOB: 1956-08-24 Today's Date: 05/26/2018   History of Present Illness  62 y.o. male with medical history significant of COPD with active tobacco abuse on 2 L primarily at night, bipolar 1 disorder, chronic alcohol abuse, unspecified congestive heart failure comes in with syncopal episode and left hip pain. s/p 8/19 L THA, anterior approach.   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 Pt is currently limited in safe mobility by oxygen desaturation (see General Comments), as well as decreased ROM and L hip pain. Pt currently min guard for transfers and ambulation of 580 feet with RW. PT recommends outpatient PT level rehab at d/c to improve safe mobility. PT will continue to follow acutely.     Follow Up Recommendations Supervision for mobility/OOB;Outpatient PT    Equipment Recommendations  Rolling walker with 5" wheels       Precautions / Restrictions Precautions Precautions: Fall Restrictions Weight Bearing Restrictions: Yes LLE Weight Bearing: Weight bearing as tolerated      Mobility  Bed Mobility               General bed mobility comments: OOB in recliner  Transfers Overall transfer level: Needs assistance Equipment used: Rolling walker (2 wheeled) Transfers: Sit to/from Stand Sit to Stand: Min guard         General transfer comment: min guard for safety, good technique for power up to RW  Ambulation/Gait Ambulation/Gait assistance: Min guard Gait Distance (Feet): 580 Feet Assistive device: Rolling walker (2 wheeled) Gait Pattern/deviations: Step-through pattern;Decreased step length - right;Decreased stance time - left;Antalgic Gait velocity: slowed Gait velocity interpretation: 1.31 - 2.62 ft/sec, indicative of limited community ambulator General Gait Details: min guard  for safety, pt with slightly antalgic step through gait with progressively less UE support from RW as ambulation progressed.         Balance Overall balance assessment: Mild deficits observed, not formally tested                                           Pertinent Vitals/Pain Pain Assessment: 0-10 Pain Score: 7  Pain Location: L hip Pain Descriptors / Indicators: Aching;Sore Pain Intervention(s): Limited activity within patient's tolerance;Monitored during session;Repositioned    Home Living Family/patient expects to be discharged to:: Private residence Living Arrangements: Spouse/significant other;Children Available Help at Discharge: Family;Available 24 hours/day Type of Home: House Home Access: Stairs to enter Entrance Stairs-Rails: Can reach both Entrance Stairs-Number of Steps: 4 Home Layout: One level        Prior Function Level of Independence: Independent         Comments: Curatormechanic 7days/wk,         Extremity/Trunk Assessment   Upper Extremity Assessment Upper Extremity Assessment: Overall WFL for tasks assessed    Lower Extremity Assessment Lower Extremity Assessment: LLE deficits/detail LLE Deficits / Details: L THA, hip ROM decreased, knee and ankle WFL, strength grossly 3+/5       Communication   Communication: No difficulties  Cognition Arousal/Alertness: Awake/alert Behavior During Therapy: WFL for tasks assessed/performed Overall Cognitive Status: Within Functional Limits for tasks assessed  General Comments General comments (skin integrity, edema, etc.): Pt on RA on entry, uses 2L supplemental O2 via nasal cannula at night, and MD requested O2 use with ambulation, on 2L Supplemental O2 via nasal cannula SaO2 dropped to 86%O2. Pt given vc for pursed lipped breathing and inhale through nose and SaO2 quickly rebounded to 94%O2, able to maintain for duration of ambulation.  When seated in recliner supplemental O2 removed and SaO2 dropped to 86%O2, 2L via [place] returned and SaO2 92%O2        Assessment/Plan    PT Assessment Patient needs continued PT services  PT Problem List Decreased range of motion;Decreased mobility;Decreased knowledge of use of DME;Cardiopulmonary status limiting activity;Pain       PT Treatment Interventions DME instruction;Gait training;Stair training;Functional mobility training;Therapeutic activities;Therapeutic exercise;Balance training;Cognitive remediation;Patient/family education    PT Goals (Current goals can be found in the Care Plan section)  Acute Rehab PT Goals Patient Stated Goal: go home PT Goal Formulation: With patient Time For Goal Achievement: 06/09/18 Potential to Achieve Goals: Good    Frequency Min 6X/week    AM-PAC PT "6 Clicks" Daily Activity  Outcome Measure Difficulty turning over in bed (including adjusting bedclothes, sheets and blankets)?: A Little Difficulty moving from lying on back to sitting on the side of the bed? : Unable Difficulty sitting down on and standing up from a chair with arms (e.g., wheelchair, bedside commode, etc,.)?: Unable Help needed moving to and from a bed to chair (including a wheelchair)?: A Little Help needed walking in hospital room?: A Little Help needed climbing 3-5 steps with a railing? : A Lot 6 Click Score: 13    End of Session Equipment Utilized During Treatment: Gait belt;Oxygen Activity Tolerance: Patient tolerated treatment well Patient left: in chair;with call bell/phone within reach Nurse Communication: Mobility status PT Visit Diagnosis: Other abnormalities of gait and mobility (R26.89);History of falling (Z91.81);Difficulty in walking, not elsewhere classified (R26.2)    Time: [id number] PT Time Calculation (min) (ACUTE ONLY): 27 min   Charges:   PT Evaluation $PT Eval Moderate Complexity: 1 Mod PT Treatments $Gait Training: 8-22 mins         Efraim Vanallen B. Beverely RisenVan Simmons PT, DPT Acute Rehabilitation  [phone] Pager [phone]    Joel Simmons 05/26/2018, 3:46 PM

## 2018-05-26 NOTE — Progress Notes (Signed)
PROGRESS NOTE    Joel Simmons  ZOX:[id number] DOB: [birth year] DOA: 05/23/2018 PCP: Roderick Pee, MD      Brief Narrative:  Mr. Pickel is a 62 y.o. M with COPD, chronic respiratory failure, noncompliant with home O2, alcohol use disorder and ?Bipolar who presents with syncope and LEFT hip fracture.     Assessment & Plan:  Left hip fracture Uncomplicated anterior approach hemiarthroplasty on 8/19, doing well.  Hgb and Cr stable today. -Oxycodone per protocol -Lovenox to aspirin for DVT ppx -Recommend osteoporosis screening after discharge if not done previously -PT ordered -WBAT    Syncope Telemetry unremarkable.  Seen by Dr. Graciela Husbands, suspect syncope was vagally mediated.  Echo done, shows normal EF, normal wall thickness.  No further workup.  COPD with mild exacerbation Chronic respiratory failure This seems to be improving.  -Continue steroids -Continue bronchodilators -Continue home Dulera and Singulair  Alcohol use disorder Does not appear to be withdrawing -Discontinue CIWA -Continue thiamine, folate  Doubt chronic congestive heart failure Family report he has been diagnosed in past with CHF.  His EF is normal, no diastolic dysfunction on echo here.  Furthermore, does not take daily diuretic, nor has ever had congestive episode requiring diuretics either in or outpatient.  Other medications -Continue citalopram  Leukocytosis Likely reactive.        DVT prophylaxis: Lovenox Code Status: FULL Family Communication: None present MDM and disposition Plan: The below labs and imaging reports were reviewed and summarized above.  Medication management as above.  Patient was admitted with left hip fracture, mild COPD flare.  He went to the OR on 8/19, he is now weightbearing as tolerated and PT are recommending outpatient PT.  He can likely go home Wednesday or Thursday.   Consultants:   Orthopedics  Cardiology  Procedures:   Echo  Antimicrobials:    None    Subjective: The patient has moderate left-sided hip pain.  He is got no confusion, oliguria.  He has no dyspnea, sputum production.  No change in his chronic cough.  No chest pain.  Objective: Vitals:   05/25/18 2141 05/26/18 0212 05/26/18 0616 05/26/18 1506  BP: 106/60 100/72 101/61 109/65  Pulse: 70 80 73 80  Resp: 16 16 17 18   Temp: 97.9 F (36.6 C) 98.3 F (36.8 C) 97.6 F (36.4 C) 99.1 F (37.3 C)  TempSrc: Oral Oral Oral Oral  SpO2: 95% 98% 95% 90%  Weight:      Height:        Intake/Output Summary (Last 24 hours) at 05/26/2018 1852 Last data filed at 05/26/2018 0900 Gross per 24 hour  Intake 640 ml  Output 1100 ml  Net -460 ml   Filed Weights   05/23/18 1557 05/24/18 0405 05/25/18 0646  Weight: 72 kg 73.9 kg 71.1 kg    Examination: General appearance: Thin adult male, lying in bed, interactive, no acute distress.  Conversational. HEENT: Anicteric, conjunctival pink, lids and lashes normal.  No nasal deformity, discharge, or epistaxis.  Lips moist, dentition poor.  No oral lesions, oropharynx normal.  Hearing normal. Skin: Skin is warm and dry without suspicious lashes or lesions.. Cardiac: Regular rate and rhythm, no murmurs appreciated, JVP normal, no lower extremity edema. Respiratory: Normal respiratory rate and effort.  Wheezing throughout.  No rales. Abdomen: Abdomen soft without tenderness to palpation or guarding. MSK: No deformities or effusions of the large joints of the upper lower extremity's bilaterally.  Incisional scar not evaluated. Neuro: Awake and alert, extraocular movements  intact, moves both upper and lower extremities with normal and symmetric strength.  Leg limited on the left somewhat by pain.  Speech fluent. Psych: Sensorium intact and responding to questions, attention normal, affect normal.  Judgment and insight appear normal.      Data Reviewed: I have personally reviewed following labs and imaging studies:  CBC: Recent  Labs  Lab 05/23/18 0927 05/23/18 2353 05/25/18 0722 05/26/18 0517  WBC 15.4* 10.7* 12.6* 9.7  NEUTROABS 12.6*  --   --   --   HGB 14.8 13.5 12.3* 10.2*  HCT 44.2 39.2 36.5* 31.1*  MCV 104.5* 104.3* 104.9* 106.5*  PLT 273 236 232 215   Basic Metabolic Panel: Recent Labs  Lab 05/23/18 0927 05/23/18 2353 05/25/18 0722 05/26/18 0517  NA 134* 137 140 138  K 4.3 4.3 3.8 4.0  CL 98 100 102 100  CO2 26 30 32 31  GLUCOSE 113* 157* 93 100*  BUN 11 10 17 16   CREATININE 0.54* <0.30* 0.53* 0.48*  CALCIUM 8.9 8.7* 8.8* 8.4*   GFR: Estimated Creatinine Clearance: 98.2 mL/min (A) (by C-G formula based on SCr of 0.48 mg/dL (L)). Liver Function Tests: No results for input(s): AST, ALT, ALKPHOS, BILITOT, PROT, ALBUMIN in the last 168 hours. No results for input(s): LIPASE, AMYLASE in the last 168 hours. No results for input(s): AMMONIA in the last 168 hours. Coagulation Profile: Recent Labs  Lab 05/23/18 0927  INR 0.95   Cardiac Enzymes: Recent Labs  Lab 05/23/18 1216 05/23/18 1726 05/23/18 2353  TROPONINI <0.03 <0.03 <0.03   BNP (last 3 results) No results for input(s): PROBNP in the last 8760 hours. HbA1C: No results for input(s): HGBA1C in the last 72 hours. CBG: No results for input(s): GLUCAP in the last 168 hours. Lipid Profile: No results for input(s): CHOL, HDL, LDLCALC, TRIG, CHOLHDL, LDLDIRECT in the last 72 hours. Thyroid Function Tests: No results for input(s): TSH, T4TOTAL, FREET4, T3FREE, THYROIDAB in the last 72 hours. Anemia Panel: No results for input(s): VITAMINB12, FOLATE, FERRITIN, TIBC, IRON, RETICCTPCT in the last 72 hours. Urine analysis:    Component Value Date/Time   COLORURINE yellow 03/27/2010 1542   APPEARANCEUR Clear 03/27/2010 1542   LABSPEC >=1.030 03/27/2010 1542   PHURINE 5.5 03/27/2010 1542   HGBUR negative 03/27/2010 1542   BILIRUBINUR 1+ 03/27/2010 1542   UROBILINOGEN 2.0 03/27/2010 1542   NITRITE negative 03/27/2010 1542    Sepsis Labs: @LABRCNTIP (procalcitonin:4,lacticacidven:4)  ) Recent Results (from the past 240 hour(s))  MRSA PCR Screening     Status: None   Collection Time: 05/24/18  7:15 AM  Result Value Ref Range Status   MRSA by PCR NEGATIVE NEGATIVE Final    Comment:        The GeneXpert MRSA Assay (FDA approved for NASAL specimens only), is one component of a comprehensive MRSA colonization surveillance program. It is not intended to diagnose MRSA infection nor to guide or monitor treatment for MRSA infections. Performed at [facility], [address] [place], [place] [address]          Radiology Studies: Pelvis Portable  Result Date: 05/25/2018 CLINICAL DATA:  Status post left hip replacement today. EXAM: PORTABLE PELVIS 1-2 VIEWS COMPARISON:  Intraoperative imaging earlier today. 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 The device is located. No fracture. Surgical drain is noted. IMPRESSION: Status post left hip replacement.  No acute finding. Electronically Signed   By: Drusilla Kannerhomas  Dalessio M.D.   On: 05/25/2018 15:53  Dg C-arm 1-60 Min  Result Date: 05/25/2018 CLINICAL DATA:  Left total hip arthroplasty with anterior approach EXAM: DG C-ARM 61-120 MIN COMPARISON:  05/23/2018 left hip radiographs FINDINGS: Fluoroscopy time 0 minutes 17 seconds. Three nondiagnostic spot fluoroscopic intraoperative radiographs demonstrate postsurgical changes from left total hip arthroplasty, with appropriate positioning of the left hip hardware on these views. IMPRESSION: Intraoperative fluoroscopic guidance for left total hip arthroplasty. Electronically Signed   By: Delbert PhenixJason A Poff M.D.   On: 05/25/2018 14:38   Dg Hip Operative Unilat W Or W/o Pelvis Left  Result Date: 05/25/2018 CLINICAL DATA:  Left femoral neck fracture. EXAM: OPERATIVE LEFT HIP (WITH PELVIS IF PERFORMED)  VIEWS TECHNIQUE: Fluoroscopic spot image(s) were submitted for interpretation post-operatively. COMPARISON:   Radiographs dated 05/23/2018 FINDINGS: AP C-arm images demonstrate the patient has undergone left total hip prosthesis insertion. The components appear in excellent position in the AP projection. IMPRESSION: Left total hip prosthesis appears in good position in the AP projection. Electronically Signed   By: Francene BoyersJames  Maxwell M.D.   On: 05/25/2018 14:36        Scheduled Meds: . citalopram  20 mg Oral Daily  . docusate sodium  100 mg Oral BID  . enoxaparin (LOVENOX) injection  40 mg Subcutaneous Q24H  . folic acid  1 mg Oral Daily  . mometasone-formoterol  1 puff Inhalation BID  . montelukast  10 mg Oral Daily  . multivitamin with minerals  1 tablet Oral Daily  . predniSONE  40 mg Oral Q breakfast  . senna  1 tablet Oral BID  . thiamine  100 mg Oral Daily   Or  . thiamine  100 mg Intravenous Daily   Continuous Infusions: . lactated ringers 10 mL/hr at 05/25/18 1118     LOS: 3 days    Time spent: 25 minutes    Alberteen Samhristopher P Roshan Salamon, MD [facility] 05/26/2018, 6:52 PM     Pager [phone] --- please page though AMION:  [email or web address] Password [email or web address] If 7PM-7AM, please contact night-coverage

## 2018-05-27 ENCOUNTER — Encounter (HOSPITAL_COMMUNITY): Payer: Self-pay | Admitting: Orthopedic Surgery

## 2018-05-27 DIAGNOSIS — F319 Bipolar disorder, unspecified: Secondary | ICD-10-CM

## 2018-05-27 DIAGNOSIS — J441 Chronic obstructive pulmonary disease with (acute) exacerbation: Secondary | ICD-10-CM

## 2018-05-27 LAB — BASIC METABOLIC PANEL
ANION GAP: 6 (ref 5–15)
BUN: 11 mg/dL (ref 6–20)
CALCIUM: 8.1 mg/dL — AB (ref 8.9–10.3)
CO2: 29 mmol/L (ref 22–32)
CREATININE: 0.37 mg/dL — AB (ref 0.61–1.24)
Chloride: 102 mmol/L (ref 98–111)
GFR calc Af Amer: >60 mL/min (ref >60)
GLUCOSE: 93 mg/dL (ref 70–99)
Potassium: 4 mmol/L (ref 3.5–5.1)
Sodium: 137 mmol/L (ref 135–145)

## 2018-05-27 LAB — CBC
HCT: 30.5 % — ABNORMAL LOW (ref 39.0–52.0)
HEMOGLOBIN: 10.1 g/dL — AB (ref 13.0–17.0)
MCH: 35.2 pg — ABNORMAL HIGH (ref 26.0–34.0)
MCHC: 33.1 g/dL (ref 30.0–36.0)
MCV: 106.3 fL — ABNORMAL HIGH (ref 78.0–100.0)
PLATELETS: 241 10*3/uL (ref 150–400)
RBC: 2.87 MIL/uL — ABNORMAL LOW (ref 4.22–5.81)
RDW: 13.8 % (ref 11.5–15.5)
WBC: 9.4 10*3/uL (ref 4.0–10.5)

## 2018-05-27 MED ORDER — DOCUSATE SODIUM 100 MG PO CAPS: 100.0000 mg | ORAL_CAPSULE | Freq: Two times a day (BID) | ORAL | 0 refills | Status: AC

## 2018-05-27 MED ORDER — OXYCODONE HCL 5 MG PO TABS: 5.0000 mg | ORAL_TABLET | ORAL | 0 refills | Status: AC | PRN

## 2018-05-27 MED ORDER — PREDNISONE 20 MG PO TABS: 40.0000 mg | ORAL_TABLET | Freq: Every day | ORAL | 0 refills | Status: AC

## 2018-05-27 MED ORDER — SENNA 8.6 MG PO TABS: 1.0000 | ORAL_TABLET | Freq: Two times a day (BID) | ORAL | 0 refills | Status: AC

## 2018-05-27 MED ORDER — POLYETHYLENE GLYCOL 3350 17 G PO PACK: 17.0000 g | PACK | Freq: Every day | ORAL | 0 refills | Status: AC | PRN

## 2018-05-27 NOTE — Care Management Note (Signed)
Case Management Note  Patient Details  Name: Joel PancoastRandy Lamaster MRN: [id number] Date of Birth: [birth year]  Subjective/Objective:  62 yr old male admitted s/p fall with a left hip fracture. Patient underwent a left total hip arthroplasty on 05/25/18.                  Action/Plan: Patient spoke with patient and his wife concerning discharge plan. Choice for Home Health Agency was offered, [facility]. Patient's wife asks that she be called to arrange start of care: [phone], Gerrit HeckJill Merkley. CM will also contact [facility] to provide this number. Patient will have family support at discharge but they will not be with him 24/7.    Expected Discharge Date:  05/27/18               Expected Discharge Plan:  Home w Home Health Services  In-House Referral:  NA  Discharge planning Services  CM Consult  Post Acute Care Choice:  Durable Medical Equipment, Home Health Choice offered to:  Patient  DME Arranged:  Walker rolling DME Agency:  [facility]  HH Arranged[facility]  [facility]  [facility]  Status of Service:  Completed, signed off  If discussed at Long Length of Stay Meetings, dates discussed:    Additional Comments:  Durenda GuthrieBrady, Eliel Dudding Naomi, RN 05/27/2018, 2:00 PM

## 2018-05-27 NOTE — Plan of Care (Signed)

## 2018-05-27 NOTE — Discharge Summary (Signed)
Physician Discharge Summary  Joel Simmons ZOX:[id number] DOB: 08-05-1956 DOA: 05/23/2018  PCP: Roderick Pee, MD  Admit date: 05/23/2018 Discharge date: 05/27/2018  Admitted From: Home  Disposition:  Home with home health   Recommendations for Outpatient Follow-up:  1. Follow up with PCP in 1 week 2. Follow up with Dr. Veda Canning at [facility] in 2 weeks 3. Please obtain BMP and CBC in one week 4. Please repeat ambulatory desaturation test with 2L O2   Home Health: Yes  Equipment/Devices: Rolling walker  Discharge Condition: Fair  CODE STATUS: FULL Diet recommendation: Regular  Brief/Interim Summary: Joel Simmons is a 62 y.o. M with vision dependent COPD, bipolar disorder, chronic alcohol use who passed out from standing height and broke his LEFT hip.  The patient has previous history of 2 LOC episodes, relatively similar.  Both have been early in the day, when he Artie been drinking alcohol.  On both occasions he was outside.  The first episode was in June while he was working on a tractor, bent over, lost consciousness and was found unresponsive on the ground.  He was treated and released from the ER, without orthostatic vital signs being done.    The second occasion with the current episode, he had been working outside in the heat, had already had some beers, and was driving home from his workshop because of feeling out of breath to get oxygen which he has at home.  When he got out of his truck to go to the mailbox he walked about 20 feet and then passed out.  Found unresponsive by neighbors.  Helped to the house where he was pale, profusely diaphoretic, nauseated for approximately an hour, with O2 sats in the 80s until EMS arrived.  He could not stand due to left hip pain.  On arrival to ER, found to have hip fracture.     Discharge Diagnoses:   Left hip fracture Uncomplicated anterior approach hemiarthroplasty on 8/19 by Dr. Veda Canning, uncomplicated post-op course.  Cr  stable, Hgb small expected drop.  Aspirin 81 BID for 6 weeks.  F/u with Dr. Veda Canning on or around Sep 4.  May weight bear as tolerated.  Did well with PT and was appropriate for discharge.    Syncope Telemetry unremarkable.  Seen by Dr. Graciela Husbands, suspect syncope was vagally mediated.  Echo done, showed normal EF, normal wall thickness.  No further workup recommended.  COPD with mild exacerbation Chronic respiratory failure The patient was noted to be mildly hypoxic on room air and extremely wheezy.  He has essentially untreated COPD, still smokes, and uses oxygen only intermittently (wife states his O2 saturations are routinely in the low 80s, even at rest during the day). He appears to have memory loss due to hypoxia.  He was treated with IV steroids, and wheezing improved. Discharged to complete a 5 day burst.  If possible, consideration for PFTs and initiating LAMA would be reasonable.  Smoking cessation was strongly recommended, modalities discussed.  Alcohol use disorder No evidence of withdrawal.  Complete cessation recommended.  Doubt chronic congestive heart failure Family report he has been diagnosed in past with CHF.  His EF is normal, no diastolic dysfunction on echo here.  Furthermore, does not take daily diuretic, nor has ever had congestive episode requiring diuretics either in or outpatient.       Discharge Instructions  Discharge Instructions    Diet general   Complete by:  As directed    Discharge instructions   Complete  by:  As directed    From Dr. Maryfrances Bunnell: You were admitted for a hip fracture. While you were here, we noticed that you were having a flare of your COPD.  For the hip fracture: Follow up with Dr. Veda Canning in 2 weeks (week of labor day) Call his office at [facility]  [phone] Take aspirin 81 mg (baby aspirin) twice a day for six weeks for blood clot prevention (until Sep 30). You may weight bear as tolerated.   For the  COPD: Wear your oxygen at all times. You were treated with steroids and albuterol. Continue the steroids for 3 more days (Take prednisone 40 mg (two tabs) daily for three more days) Use albuterol as needed Have Joel Simmons check your oxygen level while walking to see if you need the oxygen during the day. Have her document how far you can walk without feeling out of breath, and how far you can walk without oxygen levels dropping.   Call Joel Simmons for a follow up in 1 week.  You also passed out before you came here.  You were evaluated by a Cardiologist, had a normal echocardiogram (ultrasound of heart) and they believe this was a simple faint from heat exhaustion and low oxygen levels.   Increase activity slowly   Complete by:  As directed      Allergies as of 05/27/2018   No Known Allergies     Medication List    STOP taking these medications   chlordiazePOXIDE 10 MG capsule Commonly known as:  LIBRIUM   montelukast 10 MG tablet Commonly known as:  SINGULAIR   naproxen sodium 220 MG tablet Commonly known as:  ALEVE   varenicline 1 MG tablet Commonly known as:  CHANTIX     TAKE these medications   albuterol 108 (90 Base) MCG/ACT inhaler Commonly known as:  PROVENTIL HFA;VENTOLIN HFA Inhale 2 puffs into the lungs every 4 (four) hours as needed for wheezing or shortness of breath.   citalopram 20 MG tablet Commonly known as:  CELEXA Take 1 tablet (20 mg total) by mouth daily.   docusate sodium 100 MG capsule Commonly known as:  COLACE Take 1 capsule (100 mg total) by mouth 2 (two) times daily.   oxyCODONE 5 MG immediate release tablet Commonly known as:  Oxy IR/ROXICODONE Take 1 tablet (5 mg total) by mouth every 4 (four) hours as needed for up to 7 days for moderate pain.   OXYGEN Inhale 2 L into the lungs.   polyethylene glycol packet Commonly known as:  MIRALAX / GLYCOLAX Take 17 g by mouth daily as needed for mild constipation.   predniSONE 20 MG  tablet Commonly known as:  DELTASONE Take 2 tablets (40 mg total) by mouth daily with breakfast. Start taking on:  05/28/2018   senna 8.6 MG Tabs tablet Commonly known as:  SENOKOT Take 1 tablet (8.6 mg total) by mouth 2 (two) times daily.            Durable Medical Equipment  (From admission, onward)         Start     Ordered   05/27/18 1117  For home use only DME Walker rolling  Once    Question:  Patient needs a walker to treat with the following condition  Answer:  S/P total hip arthroplasty   05/27/18 1116   05/27/18 1112  For home use only DME Walker rolling  [facility])  Once    Question:  Patient needs a  walker to treat with the following condition  Answer:  Hip fracture (HCC)   05/27/18 1119   05/27/18 1024  For home use only DME Walker rolling  Once    Question:  Patient needs a walker to treat with the following condition  Answer:  S/P total hip arthroplasty   05/27/18 1026         Follow-up Information    Swinteck, Arlys JohnBrian, MD. Schedule an appointment as soon as possible for a visit in 2 weeks.   Specialty:  Orthopedic Surgery Why:  For wound re-check Contact information: [address] [place] [address] [phone]          No Known Allergies  Consultations:  Cardiology   Procedures/Studies: Dg Chest 1 View  Result Date: 05/23/2018 CLINICAL DATA:  Recent fall, left hip fracture EXAM: CHEST  1 VIEW COMPARISON:  05/23/2018, 03/23/2018 FINDINGS: The heart size and mediastinal contours are within normal limits. Both lungs are clear. The visualized skeletal structures are unremarkable. IMPRESSION: No active disease. Electronically Signed   By: Judie PetitM.  Shick M.D.   On: 05/23/2018 10:04   Dg Ribs Unilateral Right  Result Date: 05/24/2018 CLINICAL DATA:  Possible rib fractures. EXAM: RIGHT RIBS - 2 VIEW COMPARISON:  Chest x-ray earlier today as well as 03/23/2018 FINDINGS: Possible subtle fracture over the anterior aspect of the right tenth  rib. Remainder the exam is unchanged. IMPRESSION: Possible subtle anterior right tenth rib fracture. Electronically Signed   By: Elberta Fortisaniel  Boyle M.D.   On: 05/24/2018 15:06   Ct Head Wo Contrast  Result Date: 05/23/2018 CLINICAL DATA:  62 year old male with acute head and neck injury EXAM: CT HEAD WITHOUT CONTRAST CT CERVICAL SPINE WITHOUT CONTRAST TECHNIQUE: Multidetector CT imaging of the head and cervical spine was performed following the standard protocol without intravenous contrast. Multiplanar CT image reconstructions of the cervical spine were also generated. COMPARISON:  03/23/2018 CT FINDINGS: CT HEAD FINDINGS Brain: No evidence of acute infarction, hemorrhage, hydrocephalus, extra-axial collection or mass lesion/mass effect. Minimal generalized cerebral volume loss again noted. Vascular: Carotid atherosclerotic calcifications again noted. Skull: Normal. Negative for fracture or focal lesion. Sinuses/Orbits: No acute finding. Other: None. CT CERVICAL SPINE FINDINGS Alignment: Normal. Skull base and vertebrae: No acute fracture. No primary bone lesion or focal pathologic process. Soft tissues and spinal canal: No prevertebral fluid or swelling. No visible canal hematoma. Disc levels: Moderate degenerative disc disease/spondylosis from C3-C7 again noted. Upper chest: Negative. Other: None IMPRESSION: 1. No evidence of acute intracranial abnormality. 2. No static evidence of acute injury to the cervical spine. 3. Moderate degenerative disc disease/spondylosis from C3-C7. Electronically Signed   By: Harmon PierJeffrey  Hu M.D.   On: 05/23/2018 11:19   Ct Cervical Spine Wo Contrast  Result Date: 05/23/2018 CLINICAL DATA:  62 year old male with acute head and neck injury EXAM: CT HEAD WITHOUT CONTRAST CT CERVICAL SPINE WITHOUT CONTRAST TECHNIQUE: Multidetector CT imaging of the head and cervical spine was performed following the standard protocol without intravenous contrast. Multiplanar CT image reconstructions of  the cervical spine were also generated. COMPARISON:  03/23/2018 CT FINDINGS: CT HEAD FINDINGS Brain: No evidence of acute infarction, hemorrhage, hydrocephalus, extra-axial collection or mass lesion/mass effect. Minimal generalized cerebral volume loss again noted. Vascular: Carotid atherosclerotic calcifications again noted. Skull: Normal. Negative for fracture or focal lesion. Sinuses/Orbits: No acute finding. Other: None. CT CERVICAL SPINE FINDINGS Alignment: Normal. Skull base and vertebrae: No acute fracture. No primary bone lesion or focal pathologic process. Soft tissues and  spinal canal: No prevertebral fluid or swelling. No visible canal hematoma. Disc levels: Moderate degenerative disc disease/spondylosis from C3-C7 again noted. Upper chest: Negative. Other: None IMPRESSION: 1. No evidence of acute intracranial abnormality. 2. No static evidence of acute injury to the cervical spine. 3. Moderate degenerative disc disease/spondylosis from C3-C7. Electronically Signed   By: Harmon PierJeffrey  Hu M.D.   On: 05/23/2018 11:19   Pelvis Portable  Result Date: 05/25/2018 CLINICAL DATA:  Status post left hip replacement today. EXAM: PORTABLE PELVIS 1-2 VIEWS COMPARISON:  Intraoperative imaging earlier today. 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 The device is located. No fracture. Surgical drain is noted. IMPRESSION: Status post left hip replacement.  No acute finding. Electronically Signed   By: Drusilla Kannerhomas  Dalessio M.D.   On: 05/25/2018 15:53   Dg Knee Left Port  Result Date: 05/23/2018 CLINICAL DATA:  Left femoral neck fracture. EXAM: PORTABLE LEFT KNEE - 1-2 VIEW COMPARISON:  None. FINDINGS: No evidence of fracture, dislocation, or joint effusion. No evidence of arthropathy or other focal bone abnormality. Vascular calcifications. Soft tissues are unremarkable. IMPRESSION: Negative. Electronically Signed   By: Obie DredgeWilliam T Derry M.D.   On: 05/23/2018 11:54   Dg C-arm 1-60 Min  Result Date:  05/25/2018 CLINICAL DATA:  Left total hip arthroplasty with anterior approach EXAM: DG C-ARM 61-120 MIN COMPARISON:  05/23/2018 left hip radiographs FINDINGS: Fluoroscopy time 0 minutes 17 seconds. Three nondiagnostic spot fluoroscopic intraoperative radiographs demonstrate postsurgical changes from left total hip arthroplasty, with appropriate positioning of the left hip hardware on these views. IMPRESSION: Intraoperative fluoroscopic guidance for left total hip arthroplasty. Electronically Signed   By: Delbert PhenixJason A Poff M.D.   On: 05/25/2018 14:38   Dg Hip Operative Unilat W Or W/o Pelvis Left  Result Date: 05/25/2018 CLINICAL DATA:  Left femoral neck fracture. EXAM: OPERATIVE LEFT HIP (WITH PELVIS IF PERFORMED)  VIEWS TECHNIQUE: Fluoroscopic spot image(s) were submitted for interpretation post-operatively. COMPARISON:  Radiographs dated 05/23/2018 FINDINGS: AP C-arm images demonstrate the patient has undergone left total hip prosthesis insertion. The components appear in excellent position in the AP projection. IMPRESSION: Left total hip prosthesis appears in good position in the AP projection. Electronically Signed   By: Francene BoyersJames  Maxwell M.D.   On: 05/25/2018 14:36   Dg Hip Unilat With Pelvis 2-3 Views Left  Result Date: 05/23/2018 CLINICAL DATA:  Left hip pain after fall. EXAM: DG HIP (WITH OR WITHOUT PELVIS) 2-3V LEFT COMPARISON:  None. FINDINGS: Acute, impacted fracture of the left femoral neck. No dislocation. The hip joint spaces are relatively preserved. Soft tissues are unremarkable. IMPRESSION: Acute left femoral neck fracture. Electronically Signed   By: Obie DredgeWilliam T Derry M.D.   On: 05/23/2018 10:04  Echocardiogram LV EF: 60% -   65%  ------------------------------------------------------------------- Indications:      Syncope 780.2.  ------------------------------------------------------------------- History:   PMH:   Congestive heart failure.  Chronic obstructive pulmonary disease.  Risk  factors:  Alcohol abuse. Current tobacco use.  ------------------------------------------------------------------- Study Conclusions  - Left ventricle: The cavity size was normal. Wall thickness was   normal. Systolic function was normal. The estimated ejection   fraction was in the range of 60% to 65%. Wall motion was normal;   there were no regional wall motion abnormalities. Left   ventricular diastolic function parameters were normal. - Aortic valve: Poorly visualized. Mildly calcified annulus. - Atrial septum: No defect or patent foramen ovale was identified.   Subjective: Feeling well.  No dyspnea, change in chronic cough, no sputum  production, fever, chest pain.  No leg swelling.  No confusion.  Walked with PT today, did well.  Discharge Exam: Vitals:   05/27/18 0448 05/27/18 0758  BP: 106/82   Pulse: 83   Resp: 16   Temp: 98.2 F (36.8 C)   SpO2: 100% 92%   Vitals:   05/26/18 1940 05/26/18 2006 05/27/18 0448 05/27/18 0758  BP: 119/69  106/82   Pulse: 79  83   Resp: 16  16   Temp: 98.5 F (36.9 C)  98.2 F (36.8 C)   TempSrc: Oral  Oral   SpO2: 92% 94% 100% 92%  Weight:      Height:        General: Pt is alert, awake, not in acute distress, sitting in recliner Cardiovascular: RRR, S1/S2 +, no rubs, no gallops Respiratory: CTA bilaterally, mild wheezing, improved, good air movement Abdominal: Soft, NT, ND, bowel sounds + Extremities: no edema, no cyanosis    The results of significant diagnostics from this hospitalization (including imaging, microbiology, ancillary and laboratory) are listed below for reference.     Microbiology: Recent Results (from the past 240 hour(s))  MRSA PCR Screening     Status: None   Collection Time: 05/24/18  7:15 AM  Result Value Ref Range Status   MRSA by PCR NEGATIVE NEGATIVE Final    Simmons:        The GeneXpert MRSA Assay (FDA approved for NASAL specimens only), is one component of a comprehensive MRSA  colonization surveillance program. It is not intended to diagnose MRSA infection nor to guide or monitor treatment for MRSA infections. Performed at [facility], [address], [place], [place] [address]      Labs: BNP (last 3 results) Recent Labs    05/23/18 1619  BNP 62.1   Basic Metabolic Panel: Recent Labs  Lab 05/23/18 0927 05/23/18 2353 05/25/18 0722 05/26/18 0517 05/27/18 0322  NA 134* 137 140 138 137  K 4.3 4.3 3.8 4.0 4.0  CL 98 100 102 100 102  CO2 26 30 32 31 29  GLUCOSE 113* 157* 93 100* 93  BUN 11 10 17 16 11   CREATININE 0.54* <0.30* 0.53* 0.48* 0.37*  CALCIUM 8.9 8.7* 8.8* 8.4* 8.1*   Liver Function Tests: No results for input(s): AST, ALT, ALKPHOS, BILITOT, PROT, ALBUMIN in the last 168 hours. No results for input(s): LIPASE, AMYLASE in the last 168 hours. No results for input(s): AMMONIA in the last 168 hours. CBC: Recent Labs  Lab 05/23/18 0927 05/23/18 2353 05/25/18 0722 05/26/18 0517 05/27/18 0322  WBC 15.4* 10.7* 12.6* 9.7 9.4  NEUTROABS 12.6*  --   --   --   --   HGB 14.8 13.5 12.3* 10.2* 10.1*  HCT 44.2 39.2 36.5* 31.1* 30.5*  MCV 104.5* 104.3* 104.9* 106.5* 106.3*  PLT 273 236 232 215 241   Cardiac Enzymes: Recent Labs  Lab 05/23/18 1216 05/23/18 1726 05/23/18 2353  TROPONINI <0.03 <0.03 <0.03   BNP: Invalid input(s): POCBNP CBG: No results for input(s): GLUCAP in the last 168 hours. D-Dimer No results for input(s): DDIMER in the last 72 hours. Hgb A1c No results for input(s): HGBA1C in the last 72 hours. Lipid Profile No results for input(s): CHOL, HDL, LDLCALC, TRIG, CHOLHDL, LDLDIRECT in the last 72 hours. Thyroid function studies No results for input(s): TSH, T4TOTAL, T3FREE, THYROIDAB in the last 72 hours.  Invalid input(s): FREET3 Anemia work up No results for input(s): VITAMINB12, FOLATE, FERRITIN, TIBC, IRON, RETICCTPCT  in the last 72 hours. Urinalysis    Component Value Date/Time   COLORURINE  yellow 03/27/2010 1542   APPEARANCEUR Clear 03/27/2010 1542   LABSPEC >=1.030 03/27/2010 1542   PHURINE 5.5 03/27/2010 1542   HGBUR negative 03/27/2010 1542   BILIRUBINUR 1+ 03/27/2010 1542   UROBILINOGEN 2.0 03/27/2010 1542   NITRITE negative 03/27/2010 1542   Sepsis Labs Invalid input(s): PROCALCITONIN,  WBC,  LACTICIDVEN Microbiology Recent Results (from the past 240 hour(s))  MRSA PCR Screening     Status: None   Collection Time: 05/24/18  7:15 AM  Result Value Ref Range Status   MRSA by PCR NEGATIVE NEGATIVE Final    Simmons:        The GeneXpert MRSA Assay (FDA approved for NASAL specimens only), is one component of a comprehensive MRSA colonization surveillance program. It is not intended to diagnose MRSA infection nor to guide or monitor treatment for MRSA infections. Performed at [facility], [address] [place], [place] [address]      Time coordinating discharge: 40 minutes The [place] controlled substances registry was reviewed for this patient prior to filling the <5 days supply controlled substances script.      SIGNED:   Alberteen Sam, MD  [facility] 05/27/2018, 11:19 AM

## 2018-05-27 NOTE — Progress Notes (Signed)
Physical Therapy Treatment Patient Details Name: Joel PancoastRandy Hickling MRN: [id number] DOB: Apr 18, 1956 Today's Date: 05/27/2018    History of Present Illness Pt is a 62 y.o. male with medical history significant of COPD with active tobacco abuse on 2 L primarily at night, bipolar 1 disorder, chronic alcohol abuse, unspecified congestive heart failure comes in with syncopal episode and left hip pain. s/p 8/19 L THA, anterior approach.     PT Comments    Pt making steady progress with functional mobility and successfully completed stair training this session with no difficulties. Pt ambulated on RA with SPO2 decreasing to 88% with activity, quickly recovered to mid 90's with sitting rest break. Pt would continue to benefit from skilled physical therapy services at this time while admitted and after d/c to address the below listed limitations in order to improve overall safety and independence with functional mobility.    Follow Up Recommendations  Supervision for mobility/OOB;Outpatient PT     Equipment Recommendations  Rolling walker with 5" wheels    Recommendations for Other Services       Precautions / Restrictions Precautions Precautions: Fall Restrictions Weight Bearing Restrictions: Yes LLE Weight Bearing: Weight bearing as tolerated    Mobility  Bed Mobility               General bed mobility comments: OOB in recliner  Transfers Overall transfer level: Needs assistance Equipment used: Rolling walker (2 wheeled) Transfers: Sit to/from Stand Sit to Stand: Supervision         General transfer comment: supervision for safety, good technique  Ambulation/Gait Ambulation/Gait assistance: Min guard Gait Distance (Feet): 300 Feet Assistive device: Rolling walker (2 wheeled) Gait Pattern/deviations: Step-through pattern;Decreased stride length;Decreased step length - left;Decreased step length - right;Decreased weight shift to left;Decreased stance time - left Gait  velocity: decreased Gait velocity interpretation: 1.31 - 2.62 ft/sec, indicative of limited community ambulator General Gait Details: min guard for safety, pt mildly unsteady but no overt LOB or need for physical assistance   Stairs Stairs: Yes Stairs assistance: Min guard Stair Management: One rail Right;Step to pattern;Forwards Number of Stairs: 4 General stair comments: min guard for safety, cueing for technique   Wheelchair Mobility    Modified Rankin (Stroke Patients Only)       Balance                                            Cognition Arousal/Alertness: Awake/alert Behavior During Therapy: WFL for tasks assessed/performed Overall Cognitive Status: Within Functional Limits for tasks assessed                                        Exercises      General Comments        Pertinent Vitals/Pain Pain Assessment: Faces Faces Pain Scale: Hurts little more Pain Location: L hip Pain Descriptors / Indicators: Aching;Sore Pain Intervention(s): Monitored during session;Repositioned    Home Living                      Prior Function            PT Goals (current goals can now be found in the care plan section) Acute Rehab PT Goals PT Goal Formulation: With patient Time For Goal Achievement: 06/09/18 Potential  to Achieve Goals: Good Progress towards PT goals: Progressing toward goals    Frequency    Min 6X/week      PT Plan Current plan remains appropriate    Co-evaluation              AM-PAC PT "6 Clicks" Daily Activity  Outcome Measure  Difficulty turning over in bed (including adjusting bedclothes, sheets and blankets)?: A Little Difficulty moving from lying on back to sitting on the side of the bed? : A Little Difficulty sitting down on and standing up from a chair with arms (e.g., wheelchair, bedside commode, etc,.)?: Unable Help needed moving to and from a bed to chair (including a wheelchair)?:  A Little Help needed walking in hospital room?: A Little Help needed climbing 3-5 steps with a railing? : A Little 6 Click Score: 16    End of Session   Activity Tolerance: Patient tolerated treatment well Patient left: in chair;with call bell/phone within reach Nurse Communication: Mobility status PT Visit Diagnosis: Other abnormalities of gait and mobility (R26.89);History of falling (Z91.81);Difficulty in walking, not elsewhere classified (R26.2)     Time: [id number] PT Time Calculation (min) (ACUTE ONLY): 16 min  Charges:  $Gait Training: 8-22 mins                     [place]oJennifer Daymen [place], [place], [place] [phone]    Alessandra BevelsJennifer M Taysean Wager 05/27/2018, 11:40 AM

## 2018-05-27 NOTE — Progress Notes (Signed)
Provided discharge education/instructions, all questions and concerns addressed, Pt not in distress, discharged home with belongings accompanied by wife. 

## 2019-12-13 ENCOUNTER — Ambulatory Visit
Admit: 2019-12-13 | Discharge: 2019-12-16 | Disposition: A | Discharge status: Home / Self Care | Payer: MEDICAID | Admitting: Clinical Cardiac Electrophysiology

## 2019-12-16 MED ORDER — NICOTINE (POLACRILEX) 4 MG BUCCAL LOZENGE
BUCCAL | 0 refills | 3.00000 days | Status: CP | PRN
Start: 2019-12-16 — End: 2020-01-15
  Filled 2019-12-16: qty 72, 3d supply, fill #0

## 2019-12-16 MED ORDER — NICOTINE (POLACRILEX) 4 MG GUM
BUCCAL | 0 refills | 10 days | Status: CP | PRN
Start: 2019-12-16 — End: 2020-01-15

## 2019-12-16 MED ORDER — DOXYCYCLINE HYCLATE 100 MG CAPSULE
ORAL_CAPSULE | Freq: Two times a day (BID) | ORAL | 0 refills | 2 days | Status: CP
Start: 2019-12-16 — End: 2019-12-18
  Filled 2019-12-16: qty 4, 2d supply, fill #0

## 2019-12-16 MED ORDER — (~~LOC~~ PAP ONLY) SPIRIVA RESPIMAT 2.5 MCG/ACTUATION SOLUTION FOR INHALN: 2 | g | Freq: Every day | 0 refills | 0 days | Status: AC

## 2019-12-16 MED ORDER — CITALOPRAM 20 MG TABLET
ORAL_TABLET | Freq: Every day | ORAL | 0 refills | 30 days | Status: CP
Start: 2019-12-16 — End: 2020-01-15
  Filled 2019-12-16: qty 30, 30d supply, fill #0

## 2019-12-16 MED ORDER — ALBUTEROL SULFATE HFA 90 MCG/ACTUATION INHALER T-HOME
Freq: Four times a day (QID) | RESPIRATORY_TRACT | 0 refills | 0.00000 days | Status: CP | PRN
Start: 2019-12-16 — End: 2020-12-15
  Filled 2019-12-16: qty 8.5, 25d supply, fill #0

## 2019-12-16 MED ORDER — RIVAROXABAN 20 MG TABLET
ORAL_TABLET | Freq: Every day | ORAL | 0 refills | 30.00000 days | Status: CP
Start: 2019-12-16 — End: 2020-01-15
  Filled 2019-12-16: qty 30, 30d supply, fill #0

## 2019-12-16 MED ORDER — METOPROLOL SUCCINATE ER 50 MG TABLET,EXTENDED RELEASE 24 HR
ORAL_TABLET | Freq: Every day | ORAL | 0 refills | 30 days | Status: CP
Start: 2019-12-16 — End: 2020-01-15
  Filled 2019-12-16: qty 30, 30d supply, fill #0

## 2019-12-16 MED ORDER — (~~LOC~~ PAP ONLY) SPIRIVA RESPIMAT 2.5 MCG/ACTUATION SOLUTION FOR INHALN
Freq: Every day | RESPIRATORY_TRACT | 0 refills | 0.00000 days | Status: CP
Start: 2019-12-16 — End: 2019-12-16
  Filled 2019-12-16: qty 4, 30d supply, fill #0

## 2019-12-16 MED ORDER — BUDESONIDE-FORMOTEROL HFA 80 MCG-4.5 MCG/ACTUATION AEROSOL INHALER
Freq: Two times a day (BID) | RESPIRATORY_TRACT | 0 refills | 20 days | Status: CP
Start: 2019-12-16 — End: 2020-12-15
  Filled 2019-12-16: qty 10.2, 30d supply, fill #0

## 2019-12-16 MED FILL — BUDESONIDE-FORMOTEROL HFA 80 MCG-4.5 MCG/ACTUATION AEROSOL INHALER: 30 days supply | Qty: 10 | Fill #0 | Status: AC

## 2019-12-16 MED FILL — PROAIR HFA 90 MCG/ACTUATION AEROSOL INHALER: 25 days supply | Qty: 8 | Fill #0 | Status: AC

## 2019-12-16 MED FILL — (~~LOC~~ PAP ONLY) SPIRIVA RESPIMAT 2.5 MCG/ACTUATION SOLUTION FOR INHALN: 30 days supply | Qty: 4 | Fill #0 | Status: AC

## 2019-12-16 MED FILL — NICOTINE 21 MG/24 HR DAILY TRANSDERMAL PATCH: 28 days supply | Qty: 28 | Fill #0 | Status: AC

## 2019-12-16 MED FILL — NICOTINE 14 MG/24 HR DAILY TRANSDERMAL PATCH: 28 days supply | Qty: 28 | Fill #0 | Status: AC

## 2019-12-16 MED FILL — CITALOPRAM 20 MG TABLET: 30 days supply | Qty: 30 | Fill #0 | Status: AC

## 2019-12-16 MED FILL — NICOTINE (POLACRILEX) 4 MG GUM: 10 days supply | Qty: 110 | Fill #0 | Status: AC

## 2019-12-16 MED FILL — NICOTINE (POLACRILEX) 4 MG BUCCAL LOZENGE: 3 days supply | Qty: 72 | Fill #0 | Status: AC

## 2019-12-16 MED FILL — DOXYCYCLINE HYCLATE 100 MG CAPSULE: 2 days supply | Qty: 4 | Fill #0 | Status: AC

## 2019-12-16 MED FILL — XARELTO 20 MG TABLET: 30 days supply | Qty: 30 | Fill #0 | Status: AC

## 2019-12-16 MED FILL — LISINOPRIL 5 MG TABLET: 30 days supply | Qty: 30 | Fill #0 | Status: AC

## 2019-12-16 MED FILL — METOPROLOL SUCCINATE ER 50 MG TABLET,EXTENDED RELEASE 24 HR: 30 days supply | Qty: 30 | Fill #0 | Status: AC

## 2019-12-16 MED FILL — PREDNISONE 20 MG TABLET: 1 days supply | Qty: 2 | Fill #0 | Status: AC

## 2019-12-17 MED ORDER — NICOTINE 14 MG/24 HR DAILY TRANSDERMAL PATCH
MEDICATED_PATCH | Freq: Every day | TRANSDERMAL | 0 refills | 28.00000 days | Status: CP
Start: 2019-12-17 — End: ?
  Filled 2019-12-16: qty 28, 28d supply, fill #0

## 2019-12-17 MED ORDER — PREDNISONE 20 MG TABLET
ORAL_TABLET | Freq: Once | ORAL | 0 refills | 1 days | Status: CP
Start: 2019-12-17 — End: 2019-12-17
  Filled 2019-12-16: qty 2, 1d supply, fill #0

## 2019-12-17 MED ORDER — LISINOPRIL 5 MG TABLET
ORAL_TABLET | Freq: Every day | ORAL | 0 refills | 30 days | Status: CP
Start: 2019-12-17 — End: 2020-01-16
  Filled 2019-12-16: qty 30, 30d supply, fill #0

## 2019-12-17 MED ORDER — NICOTINE 21 MG/24 HR DAILY TRANSDERMAL PATCH
MEDICATED_PATCH | Freq: Every day | TRANSDERMAL | 0 refills | 28 days | Status: CP
Start: 2019-12-17 — End: ?
  Filled 2019-12-16: qty 28, 28d supply, fill #0
  Filled 2019-12-16: qty 110, 10d supply, fill #0

## 2020-01-10 DIAGNOSIS — J441 Chronic obstructive pulmonary disease with (acute) exacerbation: Principal | ICD-10-CM

## 2020-01-10 MED ORDER — METOPROLOL SUCCINATE ER 50 MG TABLET,EXTENDED RELEASE 24 HR
ORAL_TABLET | Freq: Every day | ORAL | 1 refills | 30.00000 days | Status: CP
Start: 2020-01-10 — End: 2020-02-09
  Filled 2020-01-12: qty 30, 30d supply, fill #0

## 2020-01-10 MED ORDER — RIVAROXABAN 20 MG TABLET
ORAL_TABLET | Freq: Every day | ORAL | 1 refills | 30.00000 days | Status: CP
Start: 2020-01-10 — End: 2020-02-09
  Filled 2020-01-12: qty 30, 30d supply, fill #0

## 2020-01-10 MED ORDER — LISINOPRIL 5 MG TABLET
ORAL_TABLET | Freq: Every day | ORAL | 1 refills | 30 days | Status: CP
Start: 2020-01-10 — End: 2020-02-09
  Filled 2020-01-12: qty 30, 30d supply, fill #0

## 2020-01-11 MED ORDER — CITALOPRAM 20 MG TABLET
ORAL_TABLET | 0 refills | 0 days
Start: 2020-01-11 — End: ?

## 2020-01-12 MED FILL — CITALOPRAM 20 MG TABLET: 3 days supply | Qty: 3 | Fill #0

## 2020-01-12 MED FILL — LISINOPRIL 5 MG TABLET: 30 days supply | Qty: 30 | Fill #0 | Status: AC

## 2020-01-12 MED FILL — METOPROLOL SUCCINATE ER 50 MG TABLET,EXTENDED RELEASE 24 HR: 30 days supply | Qty: 30 | Fill #0 | Status: AC

## 2020-01-12 MED FILL — XARELTO 20 MG TABLET: 30 days supply | Qty: 30 | Fill #0 | Status: AC

## 2020-01-12 MED FILL — CITALOPRAM 20 MG TABLET: 3 days supply | Qty: 3 | Fill #0 | Status: AC

## 2020-01-17 ENCOUNTER — Ambulatory Visit
Admit: 2020-01-17 | Discharge: 2020-01-18 | Payer: MEDICAID | Attending: Student in an Organized Health Care Education/Training Program | Primary: Student in an Organized Health Care Education/Training Program

## 2020-01-17 DIAGNOSIS — F17200 Nicotine dependence, unspecified, uncomplicated: Principal | ICD-10-CM

## 2020-01-17 DIAGNOSIS — Z1159 Encounter for screening for other viral diseases: Principal | ICD-10-CM

## 2020-01-17 DIAGNOSIS — I4892 Unspecified atrial flutter: Principal | ICD-10-CM

## 2020-01-17 DIAGNOSIS — J441 Chronic obstructive pulmonary disease with (acute) exacerbation: Principal | ICD-10-CM

## 2020-01-17 MED ORDER — BUDESONIDE-FORMOTEROL HFA 80 MCG-4.5 MCG/ACTUATION AEROSOL INHALER
Freq: Two times a day (BID) | RESPIRATORY_TRACT | 11 refills | 30.00000 days | Status: CP
Start: 2020-01-17 — End: 2021-01-16
  Filled 2020-01-18: qty 10.2, 30d supply, fill #0

## 2020-01-17 MED ORDER — NICOTINE 14 MG/24 HR DAILY TRANSDERMAL PATCH
MEDICATED_PATCH | Freq: Every day | TRANSDERMAL | 3 refills | 28.00000 days | Status: CP
Start: 2020-01-17 — End: 2020-05-08
  Filled 2020-01-17: qty 28, 28d supply, fill #0

## 2020-01-17 MED ORDER — CITALOPRAM 20 MG TABLET
ORAL_TABLET | Freq: Every day | ORAL | 3 refills | 90.00000 days | Status: CP
Start: 2020-01-17 — End: 2021-01-16
  Filled 2020-01-17: qty 30, 30d supply, fill #0

## 2020-01-17 MED ORDER — BUPROPION HCL SR 150 MG TABLET,12 HR SUSTAINED-RELEASE
ORAL_TABLET | Freq: Every day | ORAL | 1 refills | 60 days | Status: CP
Start: 2020-01-17 — End: ?
  Filled 2020-01-18: qty 60, 60d supply, fill #0

## 2020-01-17 MED ORDER — FUROSEMIDE 20 MG TABLET
ORAL_TABLET | Freq: Every day | ORAL | 0 refills | 7.00000 days | Status: CP
Start: 2020-01-17 — End: 2020-01-24
  Filled 2020-01-17: qty 7, 7d supply, fill #0

## 2020-01-17 MED ORDER — (~~LOC~~ PAP ONLY) SPIRIVA RESPIMAT 2.5 MCG/ACTUATION SOLUTION FOR INHALN
Freq: Every day | RESPIRATORY_TRACT | 11 refills | 30 days | Status: CP
Start: 2020-01-17 — End: 2021-01-16
  Filled 2020-01-18: qty 4, 30d supply, fill #0

## 2020-01-17 MED ORDER — ALBUTEROL SULFATE HFA 90 MCG/ACTUATION INHALER T-HOME
Freq: Four times a day (QID) | RESPIRATORY_TRACT | 11 refills | 25 days | Status: CP | PRN
Start: 2020-01-17 — End: 2021-01-16
  Filled 2020-01-17: qty 8.5, 25d supply, fill #0

## 2020-01-17 MED FILL — ALBUTEROL SULFATE HFA 90 MCG/ACTUATION AEROSOL INHALER: 25 days supply | Qty: 8 | Fill #0 | Status: AC

## 2020-01-17 MED FILL — CITALOPRAM 20 MG TABLET: 30 days supply | Qty: 30 | Fill #0 | Status: AC

## 2020-01-17 MED FILL — NICOTINE 14 MG/24 HR DAILY TRANSDERMAL PATCH: 28 days supply | Qty: 28 | Fill #0 | Status: AC

## 2020-01-17 MED FILL — FUROSEMIDE 20 MG TABLET: 7 days supply | Qty: 7 | Fill #0 | Status: AC

## 2020-01-18 MED FILL — BUDESONIDE-FORMOTEROL HFA 80 MCG-4.5 MCG/ACTUATION AEROSOL INHALER: 30 days supply | Qty: 10 | Fill #0 | Status: AC

## 2020-01-18 MED FILL — SPIRIVA RESPIMAT 2.5 MCG/ACTUATION SOLUTION FOR INHALATION: 30 days supply | Qty: 4 | Fill #0 | Status: AC

## 2020-01-18 MED FILL — BUPROPION HCL SR 150 MG TABLET,12 HR SUSTAINED-RELEASE: 60 days supply | Qty: 60 | Fill #0 | Status: AC

## 2020-02-10 ENCOUNTER — Ambulatory Visit
Admit: 2020-02-10 | Discharge: 2020-02-10 | Payer: MEDICAID | Attending: Cardiovascular Disease | Primary: Cardiovascular Disease

## 2020-02-10 DIAGNOSIS — J441 Chronic obstructive pulmonary disease with (acute) exacerbation: Principal | ICD-10-CM

## 2020-02-10 DIAGNOSIS — F17200 Nicotine dependence, unspecified, uncomplicated: Principal | ICD-10-CM

## 2020-02-10 DIAGNOSIS — I4892 Unspecified atrial flutter: Principal | ICD-10-CM

## 2020-02-10 MED ORDER — LISINOPRIL 5 MG TABLET
ORAL_TABLET | Freq: Every day | ORAL | 3 refills | 30.00000 days | Status: CP
Start: 2020-02-10 — End: 2020-03-11
  Filled 2020-02-10: qty 30, 30d supply, fill #0

## 2020-02-10 MED ORDER — METOPROLOL SUCCINATE ER 50 MG TABLET,EXTENDED RELEASE 24 HR
ORAL_TABLET | Freq: Every day | ORAL | 3 refills | 30.00000 days | Status: CP
Start: 2020-02-10 — End: 2020-03-11
  Filled 2020-02-10: qty 30, 30d supply, fill #0

## 2020-02-10 MED ORDER — RIVAROXABAN 20 MG TABLET
ORAL_TABLET | Freq: Every day | ORAL | 3 refills | 30.00000 days | Status: CP
Start: 2020-02-10 — End: 2020-03-11
  Filled 2020-02-10: qty 30, 30d supply, fill #0

## 2020-02-10 MED FILL — METOPROLOL SUCCINATE ER 50 MG TABLET,EXTENDED RELEASE 24 HR: 30 days supply | Qty: 30 | Fill #0 | Status: AC

## 2020-02-10 MED FILL — XARELTO 20 MG TABLET: 30 days supply | Qty: 30 | Fill #0 | Status: AC

## 2020-02-10 MED FILL — LISINOPRIL 5 MG TABLET: 30 days supply | Qty: 30 | Fill #0 | Status: AC

## 2020-02-14 MED FILL — ALBUTEROL SULFATE HFA 90 MCG/ACTUATION AEROSOL INHALER: RESPIRATORY_TRACT | 25 days supply | Qty: 18 | Fill #0

## 2020-02-14 MED FILL — SPIRIVA RESPIMAT 2.5 MCG/ACTUATION SOLUTION FOR INHALATION: RESPIRATORY_TRACT | 30 days supply | Qty: 4 | Fill #1

## 2020-02-14 MED FILL — CITALOPRAM 20 MG TABLET: ORAL | 90 days supply | Qty: 90 | Fill #0

## 2020-02-14 MED FILL — ALBUTEROL SULFATE HFA 90 MCG/ACTUATION AEROSOL INHALER: 25 days supply | Qty: 18 | Fill #0 | Status: AC

## 2020-02-14 MED FILL — CITALOPRAM 20 MG TABLET: 90 days supply | Qty: 90 | Fill #0 | Status: AC

## 2020-02-14 MED FILL — BUDESONIDE-FORMOTEROL HFA 80 MCG-4.5 MCG/ACTUATION AEROSOL INHALER: 30 days supply | Qty: 10 | Fill #1 | Status: AC

## 2020-02-14 MED FILL — SPIRIVA RESPIMAT 2.5 MCG/ACTUATION SOLUTION FOR INHALATION: 30 days supply | Qty: 4 | Fill #1 | Status: AC

## 2020-02-14 MED FILL — BUDESONIDE-FORMOTEROL HFA 80 MCG-4.5 MCG/ACTUATION AEROSOL INHALER: RESPIRATORY_TRACT | 30 days supply | Qty: 10.2 | Fill #1

## 2020-02-22 ENCOUNTER — Ambulatory Visit: Admit: 2020-02-22 | Discharge: 2020-02-23 | Payer: MEDICAID

## 2020-02-24 ENCOUNTER — Ambulatory Visit
Admit: 2020-02-24 | Discharge: 2020-02-25 | Payer: MEDICAID | Attending: Student in an Organized Health Care Education/Training Program | Primary: Student in an Organized Health Care Education/Training Program

## 2020-02-24 DIAGNOSIS — K219 Gastro-esophageal reflux disease without esophagitis: Principal | ICD-10-CM

## 2020-02-24 DIAGNOSIS — J441 Chronic obstructive pulmonary disease with (acute) exacerbation: Principal | ICD-10-CM

## 2020-02-24 DIAGNOSIS — F17200 Nicotine dependence, unspecified, uncomplicated: Principal | ICD-10-CM

## 2020-02-24 DIAGNOSIS — I4892 Unspecified atrial flutter: Principal | ICD-10-CM

## 2020-02-24 MED ORDER — PANTOPRAZOLE 40 MG TABLET,DELAYED RELEASE
ORAL_TABLET | Freq: Every day | ORAL | 1 refills | 30.00000 days | Status: CP
Start: 2020-02-24 — End: 2021-02-23
  Filled 2020-02-24: qty 30, 30d supply, fill #0

## 2020-02-24 MED FILL — PANTOPRAZOLE 40 MG TABLET,DELAYED RELEASE: 30 days supply | Qty: 30 | Fill #0 | Status: AC

## 2020-03-15 MED FILL — BUDESONIDE-FORMOTEROL HFA 80 MCG-4.5 MCG/ACTUATION AEROSOL INHALER: RESPIRATORY_TRACT | 30 days supply | Qty: 10.2 | Fill #2

## 2020-03-15 MED FILL — SPIRIVA RESPIMAT 2.5 MCG/ACTUATION SOLUTION FOR INHALATION: 30 days supply | Qty: 4 | Fill #2 | Status: AC

## 2020-03-15 MED FILL — XARELTO 20 MG TABLET: ORAL | 30 days supply | Qty: 30 | Fill #0

## 2020-03-15 MED FILL — ALBUTEROL SULFATE HFA 90 MCG/ACTUATION AEROSOL INHALER: RESPIRATORY_TRACT | 25 days supply | Qty: 18 | Fill #1

## 2020-03-15 MED FILL — SPIRIVA RESPIMAT 2.5 MCG/ACTUATION SOLUTION FOR INHALATION: RESPIRATORY_TRACT | 30 days supply | Qty: 4 | Fill #2

## 2020-03-15 MED FILL — METOPROLOL SUCCINATE ER 50 MG TABLET,EXTENDED RELEASE 24 HR: ORAL | 30 days supply | Qty: 30 | Fill #0

## 2020-03-15 MED FILL — XARELTO 20 MG TABLET: 30 days supply | Qty: 30 | Fill #0 | Status: AC

## 2020-03-15 MED FILL — ALBUTEROL SULFATE HFA 90 MCG/ACTUATION AEROSOL INHALER: 25 days supply | Qty: 18 | Fill #1 | Status: AC

## 2020-03-15 MED FILL — METOPROLOL SUCCINATE ER 50 MG TABLET,EXTENDED RELEASE 24 HR: 30 days supply | Qty: 30 | Fill #0 | Status: AC

## 2020-03-15 MED FILL — BUDESONIDE-FORMOTEROL HFA 80 MCG-4.5 MCG/ACTUATION AEROSOL INHALER: 30 days supply | Qty: 10 | Fill #2 | Status: AC

## 2020-03-20 ENCOUNTER — Ambulatory Visit: Admit: 2020-03-20 | Discharge: 2020-03-21 | Payer: MEDICAID

## 2020-03-20 ENCOUNTER — Encounter: Admit: 2020-03-20 | Discharge: 2020-03-21 | Payer: MEDICAID

## 2020-03-21 MED ORDER — ALBUTEROL SULFATE 2.5 MG/3 ML (0.083 %) SOLUTION FOR NEBULIZATION
RESPIRATORY_TRACT | 0 refills | 30 days | Status: CP
Start: 2020-03-21 — End: 2021-03-21
  Filled 2020-03-21: qty 540, 30d supply, fill #0

## 2020-03-21 MED ORDER — IPRATROPIUM BROMIDE 0.02 % SOLUTION FOR INHALATION
RESPIRATORY_TRACT | 0 refills | 5 days | Status: CP
Start: 2020-03-21 — End: 2021-03-21
  Filled 2020-03-21: qty 75, 5d supply, fill #0

## 2020-03-21 MED FILL — PREDNISONE 50 MG TABLET: 3 days supply | Qty: 3 | Fill #0 | Status: AC

## 2020-03-21 MED FILL — AZITHROMYCIN 250 MG TABLET: 3 days supply | Qty: 3 | Fill #0 | Status: AC

## 2020-03-21 MED FILL — ALBUTEROL SULFATE 2.5 MG/3 ML (0.083 %) SOLUTION FOR NEBULIZATION: 30 days supply | Qty: 540 | Fill #0 | Status: AC

## 2020-03-21 MED FILL — IPRATROPIUM BROMIDE 0.02 % SOLUTION FOR INHALATION: 5 days supply | Qty: 75 | Fill #0 | Status: AC

## 2020-03-22 DIAGNOSIS — Z09 Encounter for follow-up examination after completed treatment for conditions other than malignant neoplasm: Principal | ICD-10-CM

## 2020-03-22 MED ORDER — PREDNISONE 50 MG TABLET
ORAL_TABLET | Freq: Every day | ORAL | 0 refills | 3.00000 days | Status: CP
Start: 2020-03-22 — End: ?
  Filled 2020-03-21: qty 3, 3d supply, fill #0

## 2020-03-22 MED ORDER — AZITHROMYCIN 250 MG TABLET
ORAL_TABLET | ORAL | 0 refills | 3.00000 days | Status: CP
Start: 2020-03-22 — End: ?
  Filled 2020-03-21: qty 3, 3d supply, fill #0

## 2020-04-14 MED FILL — METOPROLOL SUCCINATE ER 50 MG TABLET,EXTENDED RELEASE 24 HR: 30 days supply | Qty: 30 | Fill #1 | Status: AC

## 2020-04-14 MED FILL — BUDESONIDE-FORMOTEROL HFA 80 MCG-4.5 MCG/ACTUATION AEROSOL INHALER: RESPIRATORY_TRACT | 30 days supply | Qty: 10.2 | Fill #3

## 2020-04-14 MED FILL — XARELTO 20 MG TABLET: 30 days supply | Qty: 30 | Fill #1 | Status: AC

## 2020-04-14 MED FILL — BUDESONIDE-FORMOTEROL HFA 80 MCG-4.5 MCG/ACTUATION AEROSOL INHALER: 30 days supply | Qty: 10 | Fill #3 | Status: AC

## 2020-04-14 MED FILL — ALBUTEROL SULFATE HFA 90 MCG/ACTUATION AEROSOL INHALER: 25 days supply | Qty: 18 | Fill #2 | Status: AC

## 2020-04-14 MED FILL — SPIRIVA RESPIMAT 2.5 MCG/ACTUATION SOLUTION FOR INHALATION: 30 days supply | Qty: 4 | Fill #3 | Status: AC

## 2020-04-14 MED FILL — METOPROLOL SUCCINATE ER 50 MG TABLET,EXTENDED RELEASE 24 HR: ORAL | 30 days supply | Qty: 30 | Fill #1

## 2020-04-14 MED FILL — XARELTO 20 MG TABLET: ORAL | 30 days supply | Qty: 30 | Fill #1

## 2020-04-14 MED FILL — PANTOPRAZOLE 40 MG TABLET,DELAYED RELEASE: ORAL | 30 days supply | Qty: 30 | Fill #0

## 2020-04-14 MED FILL — PANTOPRAZOLE 40 MG TABLET,DELAYED RELEASE: 30 days supply | Qty: 30 | Fill #0 | Status: AC

## 2020-04-14 MED FILL — SPIRIVA RESPIMAT 2.5 MCG/ACTUATION SOLUTION FOR INHALATION: RESPIRATORY_TRACT | 30 days supply | Qty: 4 | Fill #3

## 2020-04-14 MED FILL — ALBUTEROL SULFATE HFA 90 MCG/ACTUATION AEROSOL INHALER: RESPIRATORY_TRACT | 25 days supply | Qty: 18 | Fill #2

## 2020-04-17 ENCOUNTER — Ambulatory Visit
Admit: 2020-04-17 | Discharge: 2020-04-18 | Attending: Student in an Organized Health Care Education/Training Program | Primary: Student in an Organized Health Care Education/Training Program

## 2020-04-17 DIAGNOSIS — K219 Gastro-esophageal reflux disease without esophagitis: Principal | ICD-10-CM

## 2020-04-17 DIAGNOSIS — Z09 Encounter for follow-up examination after completed treatment for conditions other than malignant neoplasm: Principal | ICD-10-CM

## 2020-04-17 DIAGNOSIS — J449 Chronic obstructive pulmonary disease, unspecified: Principal | ICD-10-CM

## 2020-04-17 DIAGNOSIS — F17200 Nicotine dependence, unspecified, uncomplicated: Principal | ICD-10-CM

## 2020-04-17 DIAGNOSIS — J441 Chronic obstructive pulmonary disease with (acute) exacerbation: Principal | ICD-10-CM

## 2020-04-17 MED ORDER — ALBUTEROL SULFATE HFA 90 MCG/ACTUATION INHALER T-HOME
Freq: Four times a day (QID) | RESPIRATORY_TRACT | 11 refills | 12.00000 days | Status: CP | PRN
Start: 2020-04-17 — End: 2021-04-17
  Filled 2020-05-16: qty 8.5, 25d supply, fill #0

## 2020-04-17 MED ORDER — RIVAROXABAN 20 MG TABLET
ORAL_TABLET | Freq: Every day | ORAL | 3 refills | 30.00000 days | Status: CP
Start: 2020-04-17 — End: 2020-05-17
  Filled 2020-05-16: qty 30, 30d supply, fill #0

## 2020-04-17 MED ORDER — PANTOPRAZOLE 40 MG TABLET,DELAYED RELEASE
ORAL_TABLET | Freq: Every day | ORAL | 1 refills | 30 days | Status: CP
Start: 2020-04-17 — End: 2021-04-17
  Filled 2020-05-16: qty 30, 30d supply, fill #0

## 2020-04-17 MED ORDER — METOPROLOL SUCCINATE ER 50 MG TABLET,EXTENDED RELEASE 24 HR
ORAL_TABLET | Freq: Every day | ORAL | 3 refills | 30.00000 days | Status: CP
Start: 2020-04-17 — End: 2020-05-17
  Filled 2020-05-16: qty 30, 30d supply, fill #0

## 2020-04-17 MED ORDER — BUDESONIDE-FORMOTEROL HFA 80 MCG-4.5 MCG/ACTUATION AEROSOL INHALER
Freq: Two times a day (BID) | RESPIRATORY_TRACT | 11 refills | 20.00000 days | Status: CP
Start: 2020-04-17 — End: 2021-04-17

## 2020-04-30 IMAGING — DX DG RIBS 2V*R*
1 series · 4 of 4 positions shown · non-contrast
Comparison: Chest x-ray earlier today as well as 03/23/2018

CLINICAL DATA: Possible rib fractures.

EXAM:
RIGHT RIBS - 2 VIEW

[Series 1: rib · 0.14mm/px · 4 of 4 slices shown]
[im 1/4]
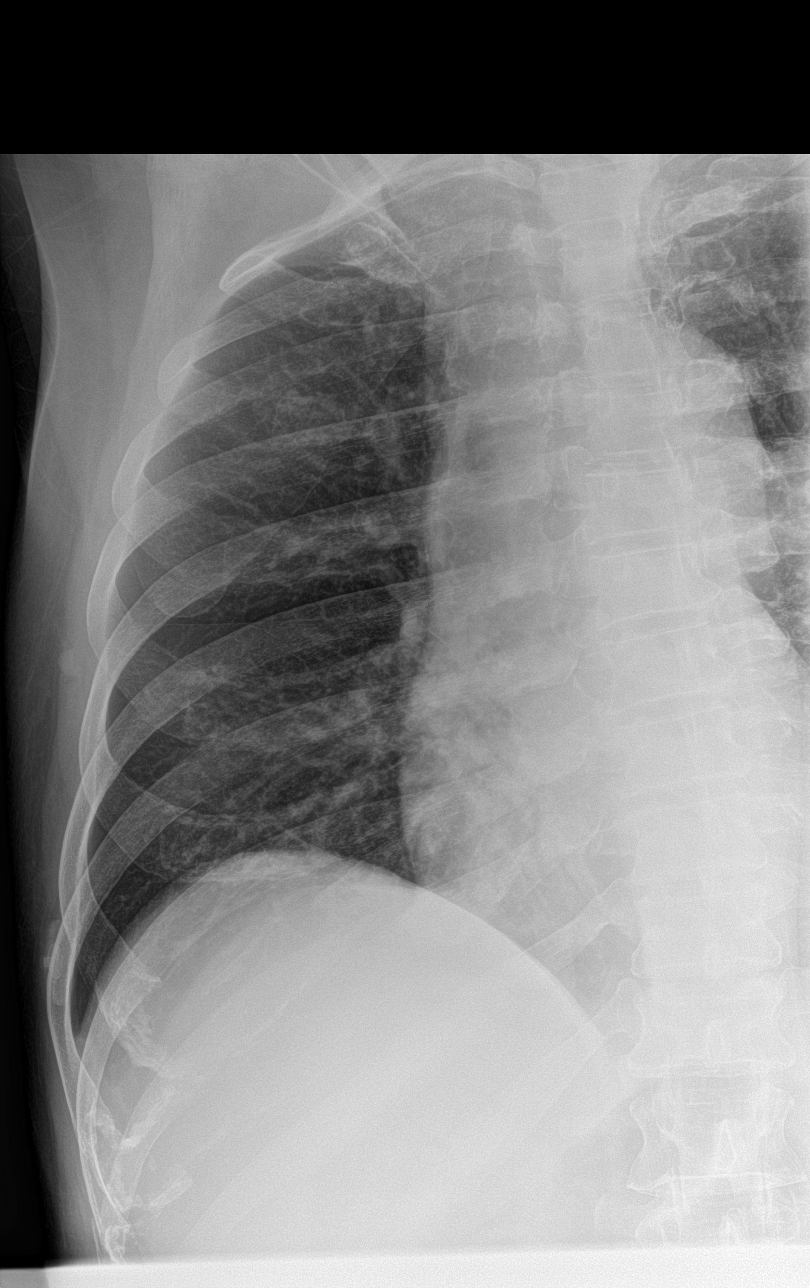
[im 2/4]
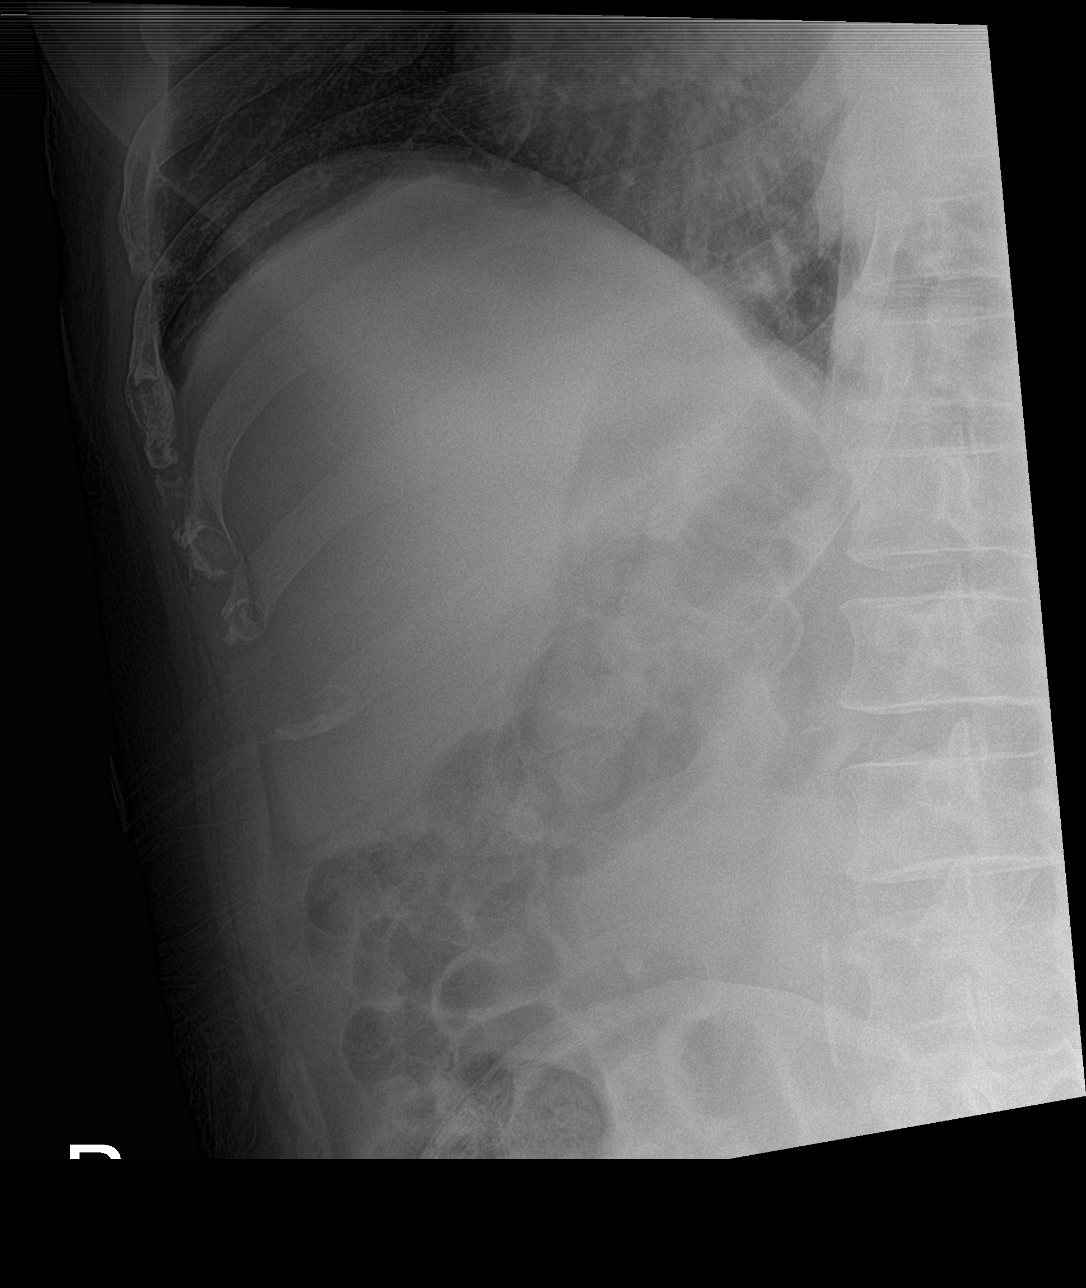
[im 3/4]
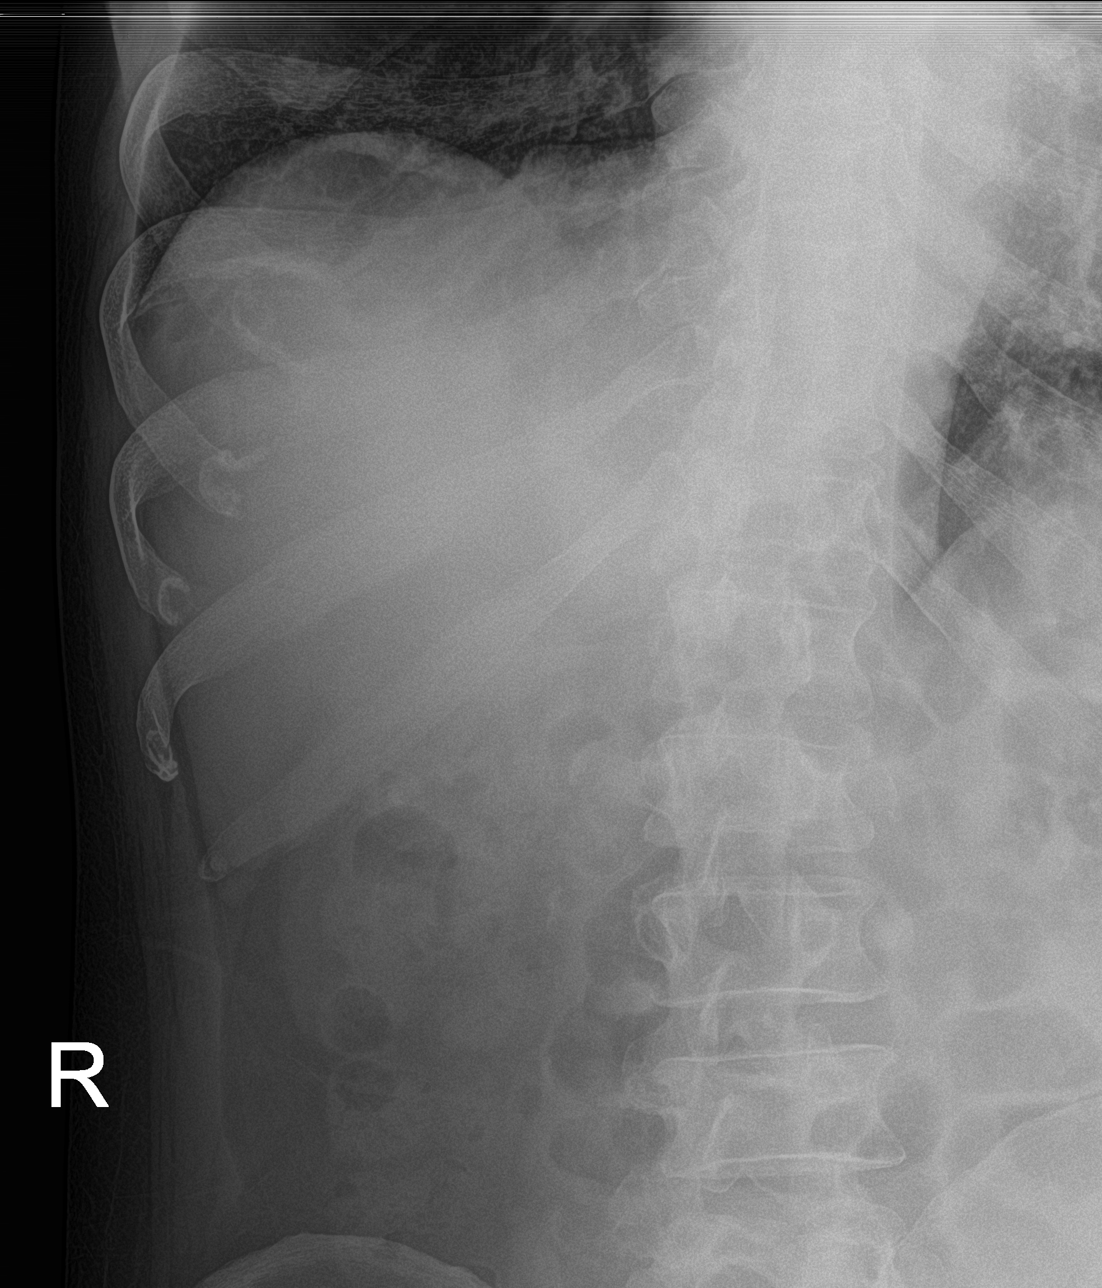
[im 4/4]
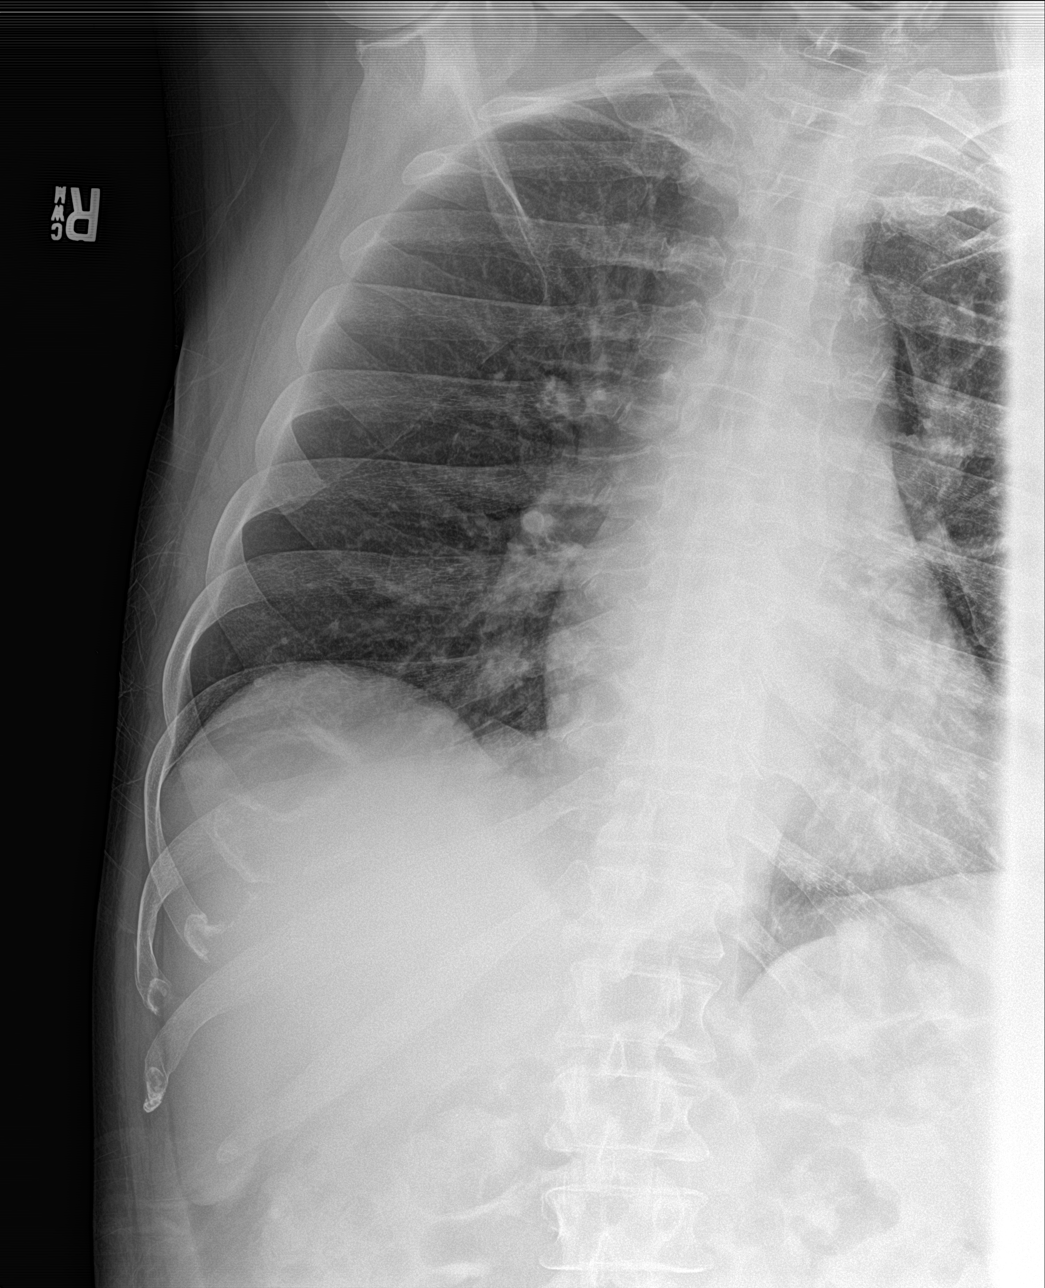

[4 of 4 positions shown; findings below may reference images not displayed]

FINDINGS: Possible subtle fracture over the anterior aspect of the right tenth
rib. Remainder the exam is unchanged.
IMPRESSION: Possible subtle anterior right tenth rib fracture.

## 2020-05-01 IMAGING — DX DG PORTABLE PELVIS
1 series · 1 of 1 positions shown · non-contrast
Comparison: Intraoperative imaging earlier today.

CLINICAL DATA: Status post left hip replacement today.

EXAM:
PORTABLE PELVIS 1-2 VIEWS

[pelvis]
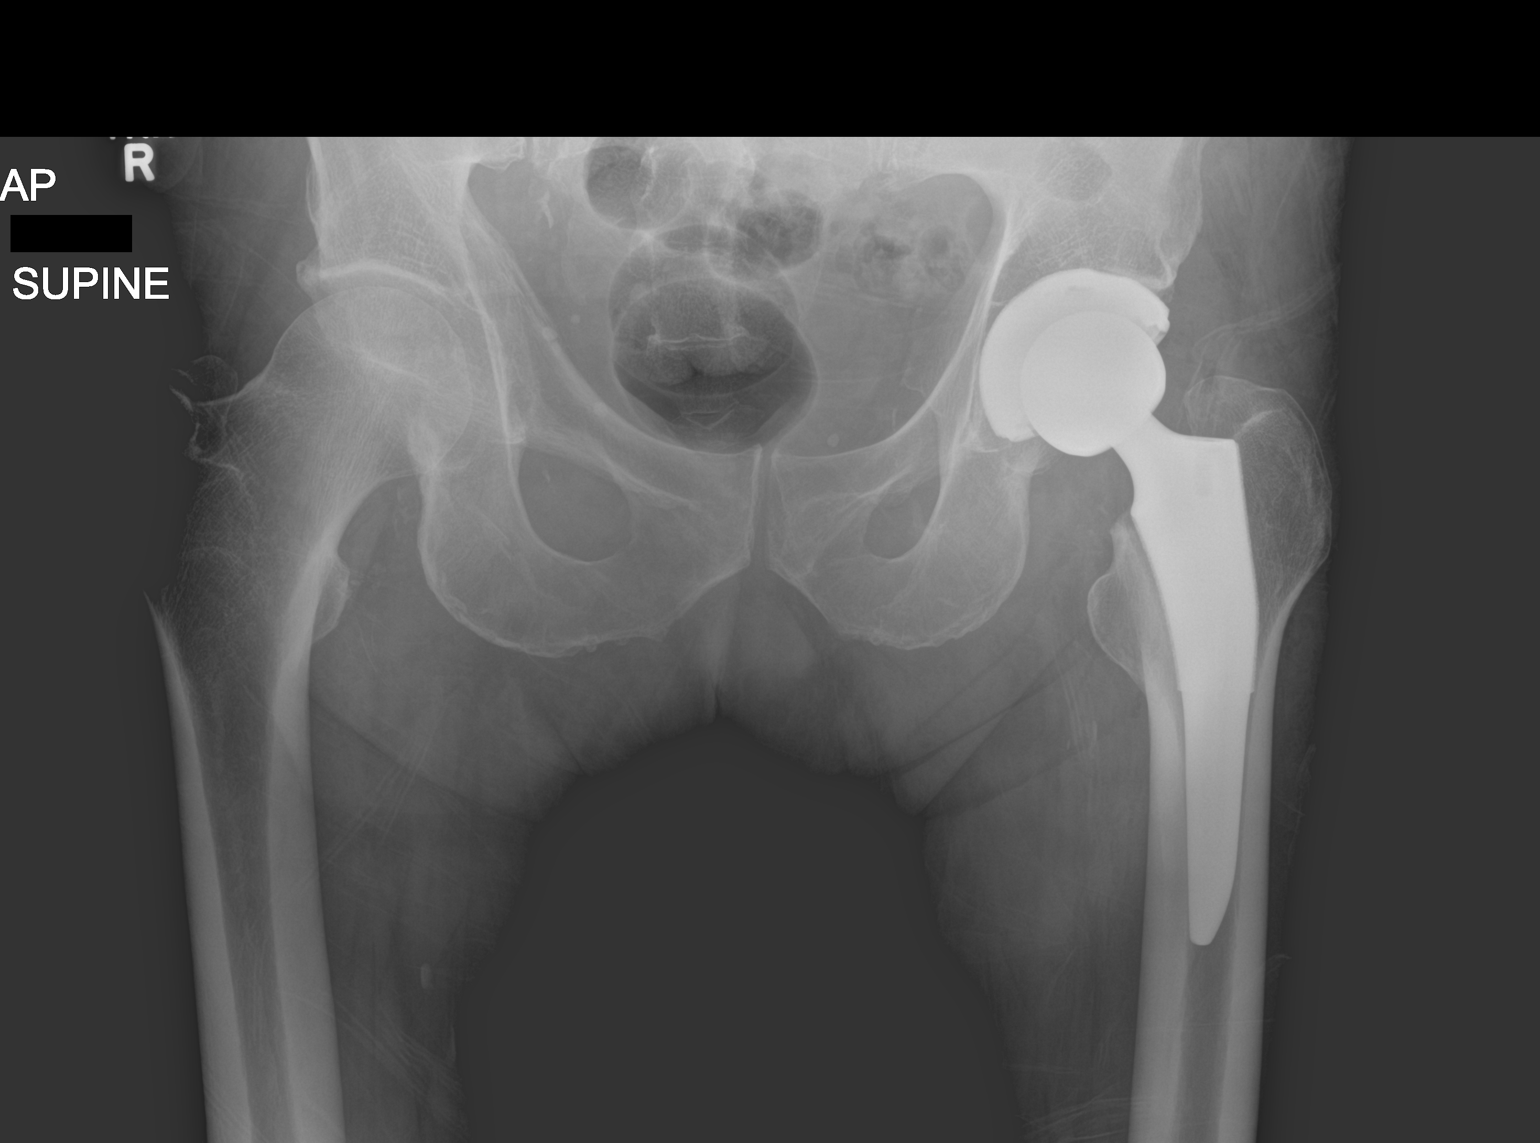

[1 of 1 positions shown; findings below may reference images not displayed]

FINDINGS: Left total hip arthroplasty is in place. The device is located. No
fracture. Surgical drain is noted.
IMPRESSION: Status post left hip replacement.  No acute finding.

## 2020-05-16 MED FILL — METOPROLOL SUCCINATE ER 50 MG TABLET,EXTENDED RELEASE 24 HR: 30 days supply | Qty: 30 | Fill #0 | Status: AC

## 2020-05-16 MED FILL — CITALOPRAM 20 MG TABLET: ORAL | 90 days supply | Qty: 90 | Fill #1

## 2020-05-16 MED FILL — SPIRIVA RESPIMAT 2.5 MCG/ACTUATION SOLUTION FOR INHALATION: 30 days supply | Qty: 4 | Fill #4 | Status: AC

## 2020-05-16 MED FILL — XARELTO 20 MG TABLET: 30 days supply | Qty: 30 | Fill #0 | Status: AC

## 2020-05-16 MED FILL — BUDESONIDE-FORMOTEROL HFA 80 MCG-4.5 MCG/ACTUATION AEROSOL INHALER: 30 days supply | Qty: 10 | Fill #0 | Status: AC

## 2020-05-16 MED FILL — BUDESONIDE-FORMOTEROL HFA 80 MCG-4.5 MCG/ACTUATION AEROSOL INHALER: RESPIRATORY_TRACT | 30 days supply | Qty: 10.2 | Fill #0

## 2020-05-16 MED FILL — SPIRIVA RESPIMAT 2.5 MCG/ACTUATION SOLUTION FOR INHALATION: RESPIRATORY_TRACT | 30 days supply | Qty: 4 | Fill #4

## 2020-05-16 MED FILL — PANTOPRAZOLE 40 MG TABLET,DELAYED RELEASE: 30 days supply | Qty: 30 | Fill #0 | Status: AC

## 2020-05-16 MED FILL — CITALOPRAM 20 MG TABLET: 90 days supply | Qty: 90 | Fill #1 | Status: AC

## 2020-05-16 MED FILL — ALBUTEROL SULFATE HFA 90 MCG/ACTUATION AEROSOL INHALER: 25 days supply | Qty: 8 | Fill #0 | Status: AC

## 2020-05-18 ENCOUNTER — Ambulatory Visit: Admit: 2020-05-18 | Discharge: 2020-05-19 | Attending: Nurse Practitioner | Primary: Nurse Practitioner

## 2020-05-29 ENCOUNTER — Institutional Professional Consult (permissible substitution)
Admit: 2020-05-29 | Payer: MEDICAID | Attending: Student in an Organized Health Care Education/Training Program | Primary: Student in an Organized Health Care Education/Training Program

## 2020-05-29 MED ORDER — AZITHROMYCIN 250 MG TABLET
ORAL_TABLET | ORAL | 0 refills | 5 days | Status: CP
Start: 2020-05-29 — End: 2020-06-03
  Filled 2020-05-29: qty 6, 5d supply, fill #0

## 2020-05-29 MED ORDER — PREDNISONE 20 MG TABLET
ORAL_TABLET | Freq: Every day | ORAL | 0 refills | 5.00000 days | Status: CP
Start: 2020-05-29 — End: 2020-06-03
  Filled 2020-05-29: qty 10, 5d supply, fill #0

## 2020-05-29 MED FILL — PREDNISONE 20 MG TABLET: 5 days supply | Qty: 10 | Fill #0 | Status: AC

## 2020-05-29 MED FILL — AZITHROMYCIN 250 MG TABLET: 5 days supply | Qty: 6 | Fill #0 | Status: AC

## 2020-06-14 MED FILL — PANTOPRAZOLE 40 MG TABLET,DELAYED RELEASE: ORAL | 30 days supply | Qty: 30 | Fill #1

## 2020-06-14 MED FILL — METOPROLOL SUCCINATE ER 50 MG TABLET,EXTENDED RELEASE 24 HR: ORAL | 30 days supply | Qty: 30 | Fill #1

## 2020-06-14 MED FILL — XARELTO 20 MG TABLET: 30 days supply | Qty: 30 | Fill #1 | Status: AC

## 2020-06-14 MED FILL — SPIRIVA RESPIMAT 2.5 MCG/ACTUATION SOLUTION FOR INHALATION: 30 days supply | Qty: 4 | Fill #5 | Status: AC

## 2020-06-14 MED FILL — BUDESONIDE-FORMOTEROL HFA 80 MCG-4.5 MCG/ACTUATION AEROSOL INHALER: RESPIRATORY_TRACT | 30 days supply | Qty: 10.2 | Fill #1

## 2020-06-14 MED FILL — METOPROLOL SUCCINATE ER 50 MG TABLET,EXTENDED RELEASE 24 HR: 30 days supply | Qty: 30 | Fill #1 | Status: AC

## 2020-06-14 MED FILL — PANTOPRAZOLE 40 MG TABLET,DELAYED RELEASE: 30 days supply | Qty: 30 | Fill #1 | Status: AC

## 2020-06-14 MED FILL — XARELTO 20 MG TABLET: ORAL | 30 days supply | Qty: 30 | Fill #1

## 2020-06-14 MED FILL — BUDESONIDE-FORMOTEROL HFA 80 MCG-4.5 MCG/ACTUATION AEROSOL INHALER: 30 days supply | Qty: 10 | Fill #1 | Status: AC

## 2020-06-14 MED FILL — SPIRIVA RESPIMAT 2.5 MCG/ACTUATION SOLUTION FOR INHALATION: RESPIRATORY_TRACT | 30 days supply | Qty: 4 | Fill #5

## 2020-06-15 MED FILL — ALBUTEROL SULFATE HFA 90 MCG/ACTUATION AEROSOL INHALER: 25 days supply | Qty: 8 | Fill #1 | Status: AC

## 2020-06-15 MED FILL — ALBUTEROL SULFATE HFA 90 MCG/ACTUATION AEROSOL INHALER: RESPIRATORY_TRACT | 25 days supply | Qty: 8.5 | Fill #1

## 2020-06-19 ENCOUNTER — Ambulatory Visit
Admit: 2020-06-19 | Discharge: 2020-06-20 | Payer: MEDICAID | Attending: Student in an Organized Health Care Education/Training Program | Primary: Student in an Organized Health Care Education/Training Program

## 2020-06-19 ENCOUNTER — Ambulatory Visit: Admit: 2020-06-19 | Discharge: 2020-06-20 | Payer: MEDICAID

## 2020-06-19 DIAGNOSIS — J441 Chronic obstructive pulmonary disease with (acute) exacerbation: Principal | ICD-10-CM

## 2020-06-19 DIAGNOSIS — J449 Chronic obstructive pulmonary disease, unspecified: Principal | ICD-10-CM

## 2020-06-19 MED ORDER — AZITHROMYCIN 250 MG TABLET
ORAL_TABLET | Freq: Every day | ORAL | 11 refills | 30.00000 days | Status: CP
Start: 2020-06-19 — End: ?
  Filled 2020-06-19: qty 30, 30d supply, fill #0

## 2020-06-19 MED ORDER — ALBUTEROL SULFATE 2.5 MG/3 ML (0.083 %) SOLUTION FOR NEBULIZATION
RESPIRATORY_TRACT | 11 refills | 30.00000 days | Status: CP
Start: 2020-06-19 — End: 2021-06-19
  Filled 2020-06-19: qty 540, 30d supply, fill #0

## 2020-06-19 MED FILL — AZITHROMYCIN 250 MG TABLET: 30 days supply | Qty: 30 | Fill #0 | Status: AC

## 2020-06-19 MED FILL — ALBUTEROL SULFATE 2.5 MG/3 ML (0.083 %) SOLUTION FOR NEBULIZATION: 30 days supply | Qty: 540 | Fill #0 | Status: AC

## 2020-07-05 DIAGNOSIS — K219 Gastro-esophageal reflux disease without esophagitis: Principal | ICD-10-CM

## 2020-07-05 MED ORDER — PANTOPRAZOLE 40 MG TABLET,DELAYED RELEASE
ORAL_TABLET | Freq: Every day | ORAL | 1 refills | 30.00000 days | Status: CP
Start: 2020-07-05 — End: 2021-07-05
  Filled 2020-07-07: qty 60, 60d supply, fill #0

## 2020-07-07 MED FILL — BUDESONIDE-FORMOTEROL HFA 80 MCG-4.5 MCG/ACTUATION AEROSOL INHALER: 30 days supply | Qty: 10 | Fill #2 | Status: AC

## 2020-07-07 MED FILL — METOPROLOL SUCCINATE ER 50 MG TABLET,EXTENDED RELEASE 24 HR: 60 days supply | Qty: 60 | Fill #2 | Status: AC

## 2020-07-07 MED FILL — BUDESONIDE-FORMOTEROL HFA 80 MCG-4.5 MCG/ACTUATION AEROSOL INHALER: RESPIRATORY_TRACT | 30 days supply | Qty: 10.2 | Fill #2

## 2020-07-07 MED FILL — METOPROLOL SUCCINATE ER 50 MG TABLET,EXTENDED RELEASE 24 HR: ORAL | 60 days supply | Qty: 60 | Fill #2

## 2020-07-07 MED FILL — SPIRIVA RESPIMAT 2.5 MCG/ACTUATION SOLUTION FOR INHALATION: RESPIRATORY_TRACT | 30 days supply | Qty: 4 | Fill #6

## 2020-07-07 MED FILL — PANTOPRAZOLE 40 MG TABLET,DELAYED RELEASE: 60 days supply | Qty: 60 | Fill #0 | Status: AC

## 2020-07-07 MED FILL — ALBUTEROL SULFATE HFA 90 MCG/ACTUATION AEROSOL INHALER: 25 days supply | Qty: 8 | Fill #2 | Status: AC

## 2020-07-07 MED FILL — XARELTO 20 MG TABLET: ORAL | 60 days supply | Qty: 60 | Fill #2

## 2020-07-07 MED FILL — XARELTO 20 MG TABLET: 60 days supply | Qty: 60 | Fill #2 | Status: AC

## 2020-07-07 MED FILL — SPIRIVA RESPIMAT 2.5 MCG/ACTUATION SOLUTION FOR INHALATION: 30 days supply | Qty: 4 | Fill #6 | Status: AC

## 2020-07-07 MED FILL — ALBUTEROL SULFATE HFA 90 MCG/ACTUATION AEROSOL INHALER: RESPIRATORY_TRACT | 25 days supply | Qty: 8.5 | Fill #2

## 2020-07-13 MED FILL — AZITHROMYCIN 250 MG TABLET: ORAL | 30 days supply | Qty: 30 | Fill #1

## 2020-07-13 MED FILL — AZITHROMYCIN 250 MG TABLET: 30 days supply | Qty: 30 | Fill #1 | Status: AC

## 2020-08-09 MED FILL — CITALOPRAM 20 MG TABLET: ORAL | 90 days supply | Qty: 90 | Fill #2

## 2020-08-09 MED FILL — SPIRIVA RESPIMAT 2.5 MCG/ACTUATION SOLUTION FOR INHALATION: RESPIRATORY_TRACT | 30 days supply | Qty: 4 | Fill #7

## 2020-08-09 MED FILL — ALBUTEROL SULFATE HFA 90 MCG/ACTUATION AEROSOL INHALER: 25 days supply | Qty: 8 | Fill #3 | Status: AC

## 2020-08-09 MED FILL — CITALOPRAM 20 MG TABLET: 90 days supply | Qty: 90 | Fill #2 | Status: AC

## 2020-08-09 MED FILL — BUDESONIDE-FORMOTEROL HFA 80 MCG-4.5 MCG/ACTUATION AEROSOL INHALER: 30 days supply | Qty: 10 | Fill #3 | Status: AC

## 2020-08-09 MED FILL — AZITHROMYCIN 250 MG TABLET: 30 days supply | Qty: 30 | Fill #2 | Status: AC

## 2020-08-09 MED FILL — ALBUTEROL SULFATE HFA 90 MCG/ACTUATION AEROSOL INHALER: RESPIRATORY_TRACT | 25 days supply | Qty: 8.5 | Fill #3

## 2020-08-09 MED FILL — AZITHROMYCIN 250 MG TABLET: ORAL | 30 days supply | Qty: 30 | Fill #2

## 2020-08-09 MED FILL — BUDESONIDE-FORMOTEROL HFA 80 MCG-4.5 MCG/ACTUATION AEROSOL INHALER: RESPIRATORY_TRACT | 30 days supply | Qty: 10.2 | Fill #3

## 2020-08-09 MED FILL — SPIRIVA RESPIMAT 2.5 MCG/ACTUATION SOLUTION FOR INHALATION: 30 days supply | Qty: 4 | Fill #7 | Status: AC

## 2020-08-16 ENCOUNTER — Ambulatory Visit: Admit: 2020-08-16 | Discharge: 2020-08-18 | Payer: MEDICAID

## 2020-08-16 DIAGNOSIS — J449 Chronic obstructive pulmonary disease, unspecified: Principal | ICD-10-CM

## 2020-08-16 DIAGNOSIS — I4892 Unspecified atrial flutter: Principal | ICD-10-CM

## 2020-08-16 DIAGNOSIS — Z87891 Personal history of nicotine dependence: Principal | ICD-10-CM

## 2020-08-16 DIAGNOSIS — J441 Chronic obstructive pulmonary disease with (acute) exacerbation: Principal | ICD-10-CM

## 2020-08-16 DIAGNOSIS — Z792 Long term (current) use of antibiotics: Principal | ICD-10-CM

## 2020-08-16 DIAGNOSIS — Z79899 Other long term (current) drug therapy: Principal | ICD-10-CM

## 2020-08-16 DIAGNOSIS — F319 Bipolar disorder, unspecified: Principal | ICD-10-CM

## 2020-08-16 DIAGNOSIS — Z20822 Contact with and (suspected) exposure to covid-19: Principal | ICD-10-CM

## 2020-08-16 DIAGNOSIS — F1010 Alcohol abuse, uncomplicated: Principal | ICD-10-CM

## 2020-08-16 DIAGNOSIS — R0602 Shortness of breath: Principal | ICD-10-CM

## 2020-08-16 DIAGNOSIS — F17210 Nicotine dependence, cigarettes, uncomplicated: Principal | ICD-10-CM

## 2020-08-16 DIAGNOSIS — I5030 Unspecified diastolic (congestive) heart failure: Principal | ICD-10-CM

## 2020-08-16 DIAGNOSIS — Z7901 Long term (current) use of anticoagulants: Principal | ICD-10-CM

## 2020-08-16 DIAGNOSIS — R059 Cough, unspecified: Principal | ICD-10-CM

## 2020-08-16 DIAGNOSIS — R0600 Dyspnea, unspecified: Principal | ICD-10-CM

## 2020-08-16 DIAGNOSIS — R0902 Hypoxemia: Principal | ICD-10-CM

## 2020-08-16 MED ORDER — PREDNISONE 20 MG TABLET
ORAL_TABLET | Freq: Every day | ORAL | 0 refills | 5.00000 days | Status: CP
Start: 2020-08-16 — End: 2020-08-22
  Filled 2020-08-17: qty 10, 5d supply, fill #0

## 2020-08-17 MED FILL — PREDNISONE 20 MG TABLET: 5 days supply | Qty: 10 | Fill #0 | Status: AC

## 2020-08-18 DIAGNOSIS — Z09 Encounter for follow-up examination after completed treatment for conditions other than malignant neoplasm: Principal | ICD-10-CM

## 2020-08-28 ENCOUNTER — Ambulatory Visit
Admit: 2020-08-28 | Discharge: 2020-08-29 | Payer: MEDICAID | Attending: Nurse Practitioner | Primary: Nurse Practitioner

## 2020-08-28 ENCOUNTER — Ambulatory Visit
Admit: 2020-08-28 | Discharge: 2020-08-29 | Payer: MEDICAID | Attending: Student in an Organized Health Care Education/Training Program | Primary: Student in an Organized Health Care Education/Training Program

## 2020-08-28 DIAGNOSIS — I4510 Unspecified right bundle-branch block: Principal | ICD-10-CM

## 2020-08-28 DIAGNOSIS — K219 Gastro-esophageal reflux disease without esophagitis: Principal | ICD-10-CM

## 2020-08-28 DIAGNOSIS — R42 Dizziness and giddiness: Principal | ICD-10-CM

## 2020-08-28 DIAGNOSIS — Z7901 Long term (current) use of anticoagulants: Principal | ICD-10-CM

## 2020-08-28 DIAGNOSIS — I4891 Unspecified atrial fibrillation: Principal | ICD-10-CM

## 2020-08-28 DIAGNOSIS — F17210 Nicotine dependence, cigarettes, uncomplicated: Principal | ICD-10-CM

## 2020-08-28 DIAGNOSIS — G4730 Sleep apnea, unspecified: Principal | ICD-10-CM

## 2020-08-28 DIAGNOSIS — Z09 Encounter for follow-up examination after completed treatment for conditions other than malignant neoplasm: Principal | ICD-10-CM

## 2020-08-28 DIAGNOSIS — J449 Chronic obstructive pulmonary disease, unspecified: Principal | ICD-10-CM

## 2020-08-28 DIAGNOSIS — Z9981 Dependence on supplemental oxygen: Principal | ICD-10-CM

## 2020-08-28 DIAGNOSIS — I483 Typical atrial flutter: Principal | ICD-10-CM

## 2020-08-28 DIAGNOSIS — I509 Heart failure, unspecified: Principal | ICD-10-CM

## 2020-08-28 DIAGNOSIS — Z800 Family history of malignant neoplasm of digestive organs: Principal | ICD-10-CM

## 2020-08-28 DIAGNOSIS — Z7951 Long term (current) use of inhaled steroids: Principal | ICD-10-CM

## 2020-08-28 DIAGNOSIS — I491 Atrial premature depolarization: Principal | ICD-10-CM

## 2020-08-28 DIAGNOSIS — Z792 Long term (current) use of antibiotics: Principal | ICD-10-CM

## 2020-08-28 DIAGNOSIS — I4892 Unspecified atrial flutter: Principal | ICD-10-CM

## 2020-08-28 MED ORDER — PANTOPRAZOLE 40 MG TABLET,DELAYED RELEASE
ORAL_TABLET | Freq: Every day | ORAL | 1 refills | 90.00000 days | Status: CP
Start: 2020-08-28 — End: 2021-02-24
  Filled 2020-08-28: qty 30, 30d supply, fill #0

## 2020-08-28 MED ORDER — CITALOPRAM 20 MG TABLET
ORAL_TABLET | Freq: Every day | ORAL | 1 refills | 90.00000 days | Status: CP
Start: 2020-08-28 — End: 2021-02-24
  Filled 2020-11-06: qty 90, 90d supply, fill #0

## 2020-08-28 MED ORDER — BLOOD PRESSURE MONITOR KIT
PACK | 0 refills | 0 days | Status: CP
Start: 2020-08-28 — End: 2021-08-28

## 2020-08-28 MED ORDER — RIVAROXABAN 20 MG TABLET
ORAL_TABLET | Freq: Every day | ORAL | 1 refills | 90 days | Status: CP
Start: 2020-08-28 — End: 2021-02-24
  Filled 2020-08-28: qty 30, 30d supply, fill #0

## 2020-08-28 MED ORDER — METOPROLOL SUCCINATE ER 50 MG TABLET,EXTENDED RELEASE 24 HR
ORAL_TABLET | Freq: Every day | ORAL | 1 refills | 90.00000 days | Status: CP
Start: 2020-08-28 — End: 2021-02-24
  Filled 2020-08-28: qty 30, 30d supply, fill #0

## 2020-08-28 MED FILL — PANTOPRAZOLE 40 MG TABLET,DELAYED RELEASE: 30 days supply | Qty: 30 | Fill #0 | Status: AC

## 2020-08-28 MED FILL — METOPROLOL SUCCINATE ER 50 MG TABLET,EXTENDED RELEASE 24 HR: 30 days supply | Qty: 30 | Fill #0 | Status: AC

## 2020-08-28 MED FILL — XARELTO 20 MG TABLET: 30 days supply | Qty: 30 | Fill #0 | Status: AC

## 2020-09-08 ENCOUNTER — Ambulatory Visit
Admit: 2020-09-08 | Discharge: 2020-09-08 | Payer: MEDICAID | Attending: Student in an Organized Health Care Education/Training Program | Primary: Student in an Organized Health Care Education/Training Program

## 2020-09-08 ENCOUNTER — Telehealth: Admit: 2020-09-08 | Discharge: 2020-09-08 | Payer: MEDICAID

## 2020-09-08 DIAGNOSIS — R079 Chest pain, unspecified: Principal | ICD-10-CM

## 2020-09-08 DIAGNOSIS — I4892 Unspecified atrial flutter: Principal | ICD-10-CM

## 2020-09-08 DIAGNOSIS — R0602 Shortness of breath: Principal | ICD-10-CM

## 2020-09-08 DIAGNOSIS — I4891 Unspecified atrial fibrillation: Principal | ICD-10-CM

## 2020-09-08 DIAGNOSIS — G8929 Other chronic pain: Principal | ICD-10-CM

## 2020-09-08 DIAGNOSIS — Z7952 Long term (current) use of systemic steroids: Principal | ICD-10-CM

## 2020-09-08 DIAGNOSIS — F17210 Nicotine dependence, cigarettes, uncomplicated: Principal | ICD-10-CM

## 2020-09-08 DIAGNOSIS — J441 Chronic obstructive pulmonary disease with (acute) exacerbation: Principal | ICD-10-CM

## 2020-09-08 DIAGNOSIS — Z79899 Other long term (current) drug therapy: Principal | ICD-10-CM

## 2020-09-08 DIAGNOSIS — I5020 Unspecified systolic (congestive) heart failure: Principal | ICD-10-CM

## 2020-09-08 MED ORDER — BUDESONIDE-FORMOTEROL HFA 80 MCG-4.5 MCG/ACTUATION AEROSOL INHALER
Freq: Two times a day (BID) | RESPIRATORY_TRACT | 11 refills | 20.00000 days | Status: CP
Start: 2020-09-08 — End: 2021-09-08

## 2020-09-08 MED ORDER — PREDNISONE 10 MG TABLET
ORAL_TABLET | Freq: Every day | ORAL | 0 refills | 37.00000 days | Status: CP
Start: 2020-09-08 — End: 2020-10-25
  Filled 2020-09-12: qty 37, 37d supply, fill #0

## 2020-09-12 MED FILL — AZITHROMYCIN 250 MG TABLET: 30 days supply | Qty: 30 | Fill #3 | Status: AC

## 2020-09-12 MED FILL — BUDESONIDE-FORMOTEROL HFA 80 MCG-4.5 MCG/ACTUATION AEROSOL INHALER: 30 days supply | Qty: 10 | Fill #0 | Status: AC

## 2020-09-12 MED FILL — PREDNISONE 10 MG TABLET: 37 days supply | Qty: 37 | Fill #0 | Status: AC

## 2020-09-12 MED FILL — AZITHROMYCIN 250 MG TABLET: ORAL | 30 days supply | Qty: 30 | Fill #3

## 2020-09-12 MED FILL — ALBUTEROL SULFATE 2.5 MG/3 ML (0.083 %) SOLUTION FOR NEBULIZATION: RESPIRATORY_TRACT | 30 days supply | Qty: 540 | Fill #1

## 2020-09-12 MED FILL — SPIRIVA RESPIMAT 2.5 MCG/ACTUATION SOLUTION FOR INHALATION: RESPIRATORY_TRACT | 30 days supply | Qty: 4 | Fill #8

## 2020-09-12 MED FILL — ALBUTEROL SULFATE 2.5 MG/3 ML (0.083 %) SOLUTION FOR NEBULIZATION: 30 days supply | Qty: 540 | Fill #1 | Status: AC

## 2020-09-12 MED FILL — SPIRIVA RESPIMAT 2.5 MCG/ACTUATION SOLUTION FOR INHALATION: 30 days supply | Qty: 4 | Fill #8 | Status: AC

## 2020-09-12 MED FILL — BUDESONIDE-FORMOTEROL HFA 80 MCG-4.5 MCG/ACTUATION AEROSOL INHALER: RESPIRATORY_TRACT | 30 days supply | Qty: 10.2 | Fill #0

## 2020-09-15 DIAGNOSIS — J479 Bronchiectasis, uncomplicated: Principal | ICD-10-CM

## 2020-09-20 ENCOUNTER — Ambulatory Visit: Admit: 2020-09-20 | Discharge: 2020-09-21 | Payer: MEDICAID

## 2020-09-20 DIAGNOSIS — J479 Bronchiectasis, uncomplicated: Principal | ICD-10-CM

## 2020-09-20 DIAGNOSIS — Z7952 Long term (current) use of systemic steroids: Principal | ICD-10-CM

## 2020-09-20 DIAGNOSIS — I5020 Unspecified systolic (congestive) heart failure: Principal | ICD-10-CM

## 2020-09-20 DIAGNOSIS — R079 Chest pain, unspecified: Principal | ICD-10-CM

## 2020-09-20 DIAGNOSIS — G8929 Other chronic pain: Principal | ICD-10-CM

## 2020-09-20 DIAGNOSIS — R0602 Shortness of breath: Principal | ICD-10-CM

## 2020-09-20 DIAGNOSIS — Z79899 Other long term (current) drug therapy: Principal | ICD-10-CM

## 2020-09-20 DIAGNOSIS — I4892 Unspecified atrial flutter: Principal | ICD-10-CM

## 2020-09-20 DIAGNOSIS — F17210 Nicotine dependence, cigarettes, uncomplicated: Principal | ICD-10-CM

## 2020-09-20 DIAGNOSIS — J441 Chronic obstructive pulmonary disease with (acute) exacerbation: Principal | ICD-10-CM

## 2020-09-20 DIAGNOSIS — I4891 Unspecified atrial fibrillation: Principal | ICD-10-CM

## 2020-09-20 MED ORDER — SODIUM CHLORIDE 3 % FOR NEBULIZATION
Freq: Two times a day (BID) | RESPIRATORY_TRACT | 3 refills | 30 days | Status: CP
Start: 2020-09-20 — End: ?

## 2020-09-20 MED ORDER — SODIUM CHLORIDE 3.5 % FOR NEBULIZATION
Freq: Two times a day (BID) | RESPIRATORY_TRACT | 3 refills | 0.00000 days | Status: CP
Start: 2020-09-20 — End: 2020-09-28

## 2020-09-28 MED ORDER — SODIUM CHLORIDE 7 % FOR NEBULIZATION
Freq: Two times a day (BID) | RESPIRATORY_TRACT | 2 refills | 30 days | Status: CP
Start: 2020-09-28 — End: 2020-12-27
  Filled 2020-09-28: qty 240, 30d supply, fill #0

## 2020-09-28 MED FILL — SODIUM CHLORIDE 7 % FOR NEBULIZATION: 30 days supply | Qty: 240 | Fill #0 | Status: AC

## 2020-10-11 MED FILL — METOPROLOL SUCCINATE ER 50 MG TABLET,EXTENDED RELEASE 24 HR: 90 days supply | Qty: 90 | Fill #0 | Status: AC

## 2020-10-11 MED FILL — ALBUTEROL SULFATE HFA 90 MCG/ACTUATION AEROSOL INHALER: 25 days supply | Qty: 8 | Fill #4 | Status: AC

## 2020-10-11 MED FILL — PANTOPRAZOLE 40 MG TABLET,DELAYED RELEASE: ORAL | 90 days supply | Qty: 90 | Fill #0

## 2020-10-11 MED FILL — METOPROLOL SUCCINATE ER 50 MG TABLET,EXTENDED RELEASE 24 HR: ORAL | 90 days supply | Qty: 90 | Fill #0

## 2020-10-11 MED FILL — PANTOPRAZOLE 40 MG TABLET,DELAYED RELEASE: 90 days supply | Qty: 90 | Fill #0 | Status: AC

## 2020-10-11 MED FILL — SPIRIVA RESPIMAT 2.5 MCG/ACTUATION SOLUTION FOR INHALATION: 30 days supply | Qty: 4 | Fill #9 | Status: AC

## 2020-10-11 MED FILL — ALBUTEROL SULFATE HFA 90 MCG/ACTUATION AEROSOL INHALER: RESPIRATORY_TRACT | 25 days supply | Qty: 8.5 | Fill #4

## 2020-10-11 MED FILL — SPIRIVA RESPIMAT 2.5 MCG/ACTUATION SOLUTION FOR INHALATION: RESPIRATORY_TRACT | 30 days supply | Qty: 4 | Fill #9

## 2020-10-25 ENCOUNTER — Ambulatory Visit: Admit: 2020-10-25 | Discharge: 2020-10-27 | Disposition: A | Discharge status: Home / Self Care | Payer: MEDICAID

## 2020-10-25 DIAGNOSIS — Z7951 Long term (current) use of inhaled steroids: Principal | ICD-10-CM

## 2020-10-25 DIAGNOSIS — J470 Bronchiectasis with acute lower respiratory infection: Principal | ICD-10-CM

## 2020-10-25 DIAGNOSIS — J441 Chronic obstructive pulmonary disease with (acute) exacerbation: Principal | ICD-10-CM

## 2020-10-25 DIAGNOSIS — J440 Chronic obstructive pulmonary disease with acute lower respiratory infection: Principal | ICD-10-CM

## 2020-10-25 DIAGNOSIS — Z7901 Long term (current) use of anticoagulants: Principal | ICD-10-CM

## 2020-10-25 DIAGNOSIS — J189 Pneumonia, unspecified organism: Principal | ICD-10-CM

## 2020-10-25 DIAGNOSIS — Z716 Tobacco abuse counseling: Principal | ICD-10-CM

## 2020-10-25 DIAGNOSIS — I5022 Chronic systolic (congestive) heart failure: Principal | ICD-10-CM

## 2020-10-25 DIAGNOSIS — J9621 Acute and chronic respiratory failure with hypoxia: Principal | ICD-10-CM

## 2020-10-25 DIAGNOSIS — I4892 Unspecified atrial flutter: Principal | ICD-10-CM

## 2020-10-25 DIAGNOSIS — F17210 Nicotine dependence, cigarettes, uncomplicated: Principal | ICD-10-CM

## 2020-10-25 DIAGNOSIS — Z20822 Contact with and (suspected) exposure to covid-19: Principal | ICD-10-CM

## 2020-10-25 DIAGNOSIS — Z79899 Other long term (current) drug therapy: Principal | ICD-10-CM

## 2020-10-25 DIAGNOSIS — J449 Chronic obstructive pulmonary disease, unspecified: Principal | ICD-10-CM

## 2020-10-27 DIAGNOSIS — Z09 Encounter for follow-up examination after completed treatment for conditions other than malignant neoplasm: Principal | ICD-10-CM

## 2020-10-27 MED ORDER — AMOXICILLIN 875 MG-POTASSIUM CLAVULANATE 125 MG TABLET
ORAL_TABLET | Freq: Two times a day (BID) | ORAL | 0 refills | 5 days | Status: CP
Start: 2020-10-27 — End: 2020-11-01
  Filled 2020-10-27: qty 10, 5d supply, fill #0

## 2020-10-28 MED ORDER — PREDNISONE 10 MG TABLET
ORAL_TABLET | ORAL | 0 refills | 11 days | Status: CP
Start: 2020-10-28 — End: 2020-11-08
  Filled 2020-10-27: qty 26, 11d supply, fill #0

## 2020-11-06 ENCOUNTER — Ambulatory Visit
Admit: 2020-11-06 | Discharge: 2020-11-07 | Payer: MEDICAID | Attending: Student in an Organized Health Care Education/Training Program | Primary: Student in an Organized Health Care Education/Training Program

## 2020-11-06 DIAGNOSIS — J449 Chronic obstructive pulmonary disease, unspecified: Principal | ICD-10-CM

## 2020-11-06 DIAGNOSIS — Z09 Encounter for follow-up examination after completed treatment for conditions other than malignant neoplasm: Principal | ICD-10-CM

## 2020-11-06 MED ORDER — BUDESONIDE-FORMOTEROL HFA 80 MCG-4.5 MCG/ACTUATION AEROSOL INHALER
Freq: Two times a day (BID) | RESPIRATORY_TRACT | 6 refills | 20 days | Status: CP
Start: 2020-11-06 — End: 2020-11-23

## 2020-11-06 MED ORDER — LEVOFLOXACIN 500 MG TABLET
ORAL_TABLET | Freq: Every day | ORAL | 0 refills | 5.00000 days | Status: CP
Start: 2020-11-06 — End: 2020-11-21
  Filled 2020-11-06: qty 8, 5d supply, fill #0

## 2020-11-06 MED ORDER — PREDNISONE 20 MG TABLET
ORAL_TABLET | ORAL | 0 refills | 12.00000 days | Status: CP
Start: 2020-11-06 — End: 2020-11-21
  Filled 2020-11-06: qty 15, 12d supply, fill #0

## 2020-11-06 MED ORDER — SPIRIVA RESPIMAT 2.5 MCG/ACTUATION SOLUTION FOR INHALATION
Freq: Every day | RESPIRATORY_TRACT | 11 refills | 30 days | Status: CP
Start: 2020-11-06 — End: 2021-11-06
  Filled 2020-11-06: qty 4, 30d supply, fill #0

## 2020-11-06 MED FILL — PROAIR HFA 90 MCG/ACTUATION AEROSOL INHALER: RESPIRATORY_TRACT | 25 days supply | Qty: 8.5 | Fill #0

## 2020-11-06 MED FILL — XARELTO 20 MG TABLET: ORAL | 30 days supply | Qty: 30 | Fill #1

## 2020-11-16 ENCOUNTER — Institutional Professional Consult (permissible substitution)
Admit: 2020-11-16 | Discharge: 2020-11-17 | Payer: MEDICAID | Attending: Student in an Organized Health Care Education/Training Program | Primary: Student in an Organized Health Care Education/Training Program

## 2020-11-16 DIAGNOSIS — J441 Chronic obstructive pulmonary disease with (acute) exacerbation: Principal | ICD-10-CM

## 2020-11-21 ENCOUNTER — Ambulatory Visit
Admit: 2020-11-21 | Discharge: 2020-11-22 | Payer: MEDICAID | Attending: Hospice and Palliative Medicine | Primary: Hospice and Palliative Medicine

## 2020-11-21 DIAGNOSIS — Z7189 Other specified counseling: Principal | ICD-10-CM

## 2020-11-21 DIAGNOSIS — R0602 Shortness of breath: Principal | ICD-10-CM

## 2020-11-21 DIAGNOSIS — J449 Chronic obstructive pulmonary disease, unspecified: Principal | ICD-10-CM

## 2020-11-21 DIAGNOSIS — Z515 Encounter for palliative care: Principal | ICD-10-CM

## 2020-11-21 MED ORDER — ALBUTEROL SULFATE HFA 90 MCG/ACTUATION INHALER T-HOME
RESPIRATORY_TRACT | 11 refills | 0 days | Status: CP | PRN
Start: 2020-11-21 — End: 2021-11-21
  Filled 2020-11-21: qty 8.5, 16d supply, fill #0

## 2020-11-21 MED ORDER — LEVOFLOXACIN 750 MG TABLET
ORAL_TABLET | Freq: Every day | ORAL | 0 refills | 5 days | Status: CP
Start: 2020-11-21 — End: 2020-11-26
  Filled 2020-11-21: qty 5, 5d supply, fill #0

## 2020-11-21 MED ORDER — PREDNISONE 20 MG TABLET
ORAL_TABLET | ORAL | 0 refills | 12.00000 days | Status: CP
Start: 2020-11-21 — End: 2020-11-23
  Filled 2020-11-21: qty 15, 12d supply, fill #0

## 2020-11-21 MED ORDER — MORPHINE CONCENTRATE 100 MG/5 ML (20 MG/ML) ORAL SOLUTION
ORAL | 0 refills | 10 days | Status: CP | PRN
Start: 2020-11-21 — End: ?
  Filled 2020-11-21: qty 21, 7d supply, fill #0

## 2020-11-21 MED FILL — SYMBICORT 80 MCG-4.5 MCG/ACTUATION HFA AEROSOL INHALER: RESPIRATORY_TRACT | 30 days supply | Qty: 10.2 | Fill #0

## 2020-11-23 DIAGNOSIS — J449 Chronic obstructive pulmonary disease, unspecified: Principal | ICD-10-CM

## 2020-11-23 MED ORDER — BUDESONIDE-FORMOTEROL HFA 160 MCG-4.5 MCG/ACTUATION AEROSOL INHALER
Freq: Two times a day (BID) | RESPIRATORY_TRACT | 6 refills | 15.00000 days | Status: CP
Start: 2020-11-23 — End: ?

## 2020-11-23 MED ORDER — AZITHROMYCIN 250 MG TABLET
ORAL_TABLET | Freq: Every day | ORAL | 3 refills | 90 days | Status: CP
Start: 2020-11-23 — End: ?
  Filled 2020-12-04: qty 30, 30d supply, fill #0

## 2020-11-23 MED ORDER — PREDNISONE 10 MG TABLET
ORAL_TABLET | Freq: Every day | ORAL | 12 refills | 30 days | Status: CP
Start: 2020-11-23 — End: ?
  Filled 2020-12-04: qty 30, 30d supply, fill #0

## 2020-12-04 ENCOUNTER — Ambulatory Visit
Admit: 2020-12-04 | Discharge: 2020-12-05 | Payer: MEDICAID | Attending: Student in an Organized Health Care Education/Training Program | Primary: Student in an Organized Health Care Education/Training Program

## 2020-12-04 DIAGNOSIS — R110 Nausea: Principal | ICD-10-CM

## 2020-12-04 DIAGNOSIS — J449 Chronic obstructive pulmonary disease, unspecified: Principal | ICD-10-CM

## 2020-12-04 MED ORDER — ONDANSETRON 4 MG DISINTEGRATING TABLET
ORAL_TABLET | Freq: Three times a day (TID) | ORAL | 0 refills | 10 days | Status: CP | PRN
Start: 2020-12-04 — End: ?
  Filled 2020-12-04: qty 30, 10d supply, fill #0

## 2020-12-04 MED FILL — PROAIR HFA 90 MCG/ACTUATION AEROSOL INHALER: RESPIRATORY_TRACT | 16 days supply | Qty: 8.5 | Fill #1

## 2020-12-04 MED FILL — SYMBICORT 160 MCG-4.5 MCG/ACTUATION HFA AEROSOL INHALER: RESPIRATORY_TRACT | 30 days supply | Qty: 10.2 | Fill #0

## 2020-12-04 MED FILL — XARELTO 20 MG TABLET: ORAL | 30 days supply | Qty: 30 | Fill #2

## 2020-12-06 ENCOUNTER — Ambulatory Visit
Admit: 2020-12-06 | Discharge: 2020-12-07 | Payer: MEDICAID | Attending: Student in an Organized Health Care Education/Training Program | Primary: Student in an Organized Health Care Education/Training Program

## 2020-12-06 DIAGNOSIS — D721 Eosinophilia, unspecified type: Principal | ICD-10-CM

## 2020-12-06 DIAGNOSIS — J441 Chronic obstructive pulmonary disease with (acute) exacerbation: Principal | ICD-10-CM

## 2020-12-06 DIAGNOSIS — J479 Bronchiectasis, uncomplicated: Principal | ICD-10-CM

## 2020-12-06 DIAGNOSIS — D72118 Other hypereosinophilic syndrome: Principal | ICD-10-CM

## 2020-12-13 DIAGNOSIS — J479 Bronchiectasis, uncomplicated: Principal | ICD-10-CM

## 2020-12-13 MED ORDER — NUCALA 100 MG/ML SUBCUTANEOUS AUTO-INJECTOR
SUBCUTANEOUS | 6 refills | 0.00000 days | Status: CP
Start: 2020-12-13 — End: ?
  Filled 2020-12-25: qty 1, 28d supply, fill #0

## 2020-12-21 NOTE — Unmapped (Signed)
[facility] Specialty Medication Onboarding    Specialty Medication: [name]  Prior Authorization: Approved   Financial Assistance: No - copay  <$25  Final Copay/Day Supply: $3 / 28    Insurance Restrictions: None     Notes to Pharmacist: None    The triage team has completed the benefits investigation and has determined that the patient is able to fill this medication at [facility]. Please contact the patient to complete the onboarding or follow up with the prescribing physician as needed.

## 2020-12-22 MED ORDER — EPINEPHRINE 0.3 MG/0.3 ML INJECTION, AUTO-INJECTOR
Freq: Once | INTRAMUSCULAR | 1 refills | 1.00000 days | Status: CP
Start: 2020-12-22 — End: 2020-12-24
  Filled 2020-12-25: qty 2, 2d supply, fill #0

## 2020-12-22 NOTE — Unmapped (Signed)
This patient is receiving [name] as a clinic administered medication. Patient is aware of copay of $3. The patient prefers all future communication to occur with the clinic. This patient is being disenrolled from our specialty management program and will be added to a patient list for appropriate follow up.          [facility]   Patient Onboarding/Medication Counseling    Mr.Lovings is a 65 y.o. male with bronchiectasis who I am counseling today on initiation of therapy.  I am speaking to the patient's family member, wife [name] who is EMT.    Was a Nurse, learning disability used for this call? No    Verified patient's date of birth / HIPAA.    Specialty medication(s) to be sent: CF/Pulmonary: -[name] 100mg /ml      Non-specialty medications/supplies to be sent: Epi-Pen       Medications not needed at this time: N/A         [name] (mepolizumab)    Medication & Administration     Dosage: [name] 100mg /ml Auto-injector: Inject 1 ml (100mg ) SQ once every 4 weeks    Is this the patient's first time using [name] at home?:  Yes, the first injection should be administered in clinic under supervision of a medical provider where they will provide proper injection technique and monitor for allergic response. Please contact the clinic at [phone] to make an appointment for the first injection and bring [name] and Epi-pen with you to the appointment      Administration:     Auto-Injector:  ??? Gather all supplies needed for injection on a clean, flat working surface: medication syringe(s) removed from packaging, alcohol swab, sharps container, etc.  ??? Look at the medication label ??? look for correct medication, correct dose, and check the expiration date  ??? Look at the medication ??? the liquid in the syringe should appear clear and colorless to slightly yellow. Do not use if the solution is cloudy, leaking, or has particles  ??? Lay the syringe on a flat surface and allow it to warm up to room temperature for at least 30 minutes  ??? Select injection site ??? you can use the front of your thigh or your belly (but not the area 2 inches around your belly button); if someone else is giving you the injection you can also use your upper arm in the skin covering your triceps muscle  ??? Prepare injection site ??? wash your hands and clean the skin at the injection site with an alcohol swab and let it air dry, do not touch the injection site again before the injection  ??? Pull off the needle safety cap, do not remove until immediately prior to injection; Inject within 5 minutes of removing the clear needle cap. Do not touch the yellow needle guard or put the cap back on.   ??? With the inspection window facing you, press auto-injector straight on injection site. Hold it down and push it down against skin. This will make yellow needle guard slide up   ??? You should hear a click to let you know injection has started   ??? The yellow indicator will move down through inspection window as you are completing your dose   ??? It may take 15 seconds to get the full dose   ??? Continue holding until you hear the second click. The inspection window should be filled with the yellow indicator   ??? Continue to hold for another 5 seconds after  you hear the 2nd click   ??? Dispose of the used syringe immediately in your sharps disposal container, do not attempt to recap the needle prior to disposing  ??? If you see any blood at the injection site, press a cotton ball or gauze on the site and maintain pressure until the bleeding stops, do not rub the injection site      Adherence/Missed dose instructions:  Take missed dose as soon as you remember. If it is close to the time of your next dose, skip the dose and resume with your next scheduled dose.    Goals of Therapy     Control asthma and help reduce frequency of exacerbations and use of oral steroids    Side Effects & Monitoring Parameters     ??? Headache   ??? Back pain   ??? Feeling tired or weak   ??? Redness or swelling at injection site   ??? Pain or burning where the drug was used    The following side effects should be reported to the provider:  ??? Signs of allergic reaction such as rash, hives, itching, red, swollen, blistered, or peeling skin, trouble breathing or swallowing, swelling of the mouth, face, lips, tongue, or throat   ??? Dizziness or passing out   ??? Very bad headache   ??? Flushing   ??? Feeling hot or cold   ??? Shortness of breath      Contraindications, Warnings, & Precautions     ??? Hypersensitivity to mepolizumab or any components of the formualy    Drug/Food Interactions     ??? Medication list reviewed in Epic. The patient was instructed to inform the care team before taking any new medications or supplements. No drug interactions identified.   .     Storage, Handling Precautions, & Disposal   ??? Prefilled autoinjector/syringe: Store at 2??C to 8??C (36??F to 46??F). Do not freeze; protect from light. Do not shake. An unopened carton may be stored at ?30??C (86??F) for ?7 days; discard if kept at room temperature for >7 days. Once autoinjector/syringe is removed from carton must administer within 8 hours; otherwise discard.      Current Medications (including OTC/herbals), Comorbidities and Allergies     Current Outpatient Medications   Medication Sig Dispense Refill   ??? albuterol (PROVENTIL HFA;VENTOLIN HFA) 90 mcg/actuation inhaler Inhale 2 puffs every four (4) hours as needed for wheezing or shortness of breath. 8.5 g 11   ??? albuterol 2.5 mg /3 mL (0.083 %) nebulizer solution Inhale 3 mL (2.5 mg total) by nebulization Every four (4) hours. 540 mL 11   ??? azithromycin (ZITHROMAX) 250 MG tablet Take 1 tablet (250 mg total) by mouth daily. 90 tablet 3   ??? blood pressure monitor (BLOOD PRESSURE KIT) Kit Please give 1 kit to patient. 1 kit 0   ??? budesonide-formoteroL (SYMBICORT) 160-4.5 mcg/actuation inhaler Inhale 2 puffs by mouth Two (2) times a day. 10.2 g 6   ??? citalopram (CELEXA) 20 MG tablet Take 1 tablet (20 mg total) by mouth daily. 90 tablet 1   ??? mepolizumab ([name]) 100 mg/mL [name] Inject the contents of 1 pen (100 mg) under the skin every twenty-eight (28) days. 1 mL 6   ??? metoprolol succinate (TOPROL-XL) 50 MG 24 hr tablet Take 1 tablet (50 mg total) by mouth daily. 90 tablet 1   ??? MORPhine 100 mg/5 mL (20 mg/mL) concentrated solution Take 0.5 mL (10 mg total) by mouth every four (4) hours  as needed (for shortness of breath). 30 mL 0   ??? ondansetron (ZOFRAN-ODT) 4 MG disintegrating tablet Dissolve 1 tablet (4 mg total) by mouth on tongue every eight (8) hours as needed for nausea 30 tablet 0   ??? pantoprazole (PROTONIX) 40 MG tablet Take 1 tablet (40 mg total) by mouth daily. 90 tablet 1   ??? predniSONE (DELTASONE) 10 MG tablet Take 1 tablet (10 mg total) by mouth daily. 30 tablet 12   ??? rivaroxaban (XARELTO) 20 mg tablet Take 1 tablet (20 mg total) by mouth daily with evening meal. 90 tablet 1   ??? sodium chloride 7% 7 % Nebu Inhale 4 mL by nebulization two (2) times a day. 240 mL 2   ??? tiotropium bromide (SPIRIVA RESPIMAT) 2.5 mcg/actuation inhalation mist Inhale 2 puffs by mouth daily. 4 g 11     No current facility-administered medications for this visit.       No Known Allergies    Patient Active Problem List   Diagnosis   ??? COPD with acute exacerbation (CMS-HCC)   ??? Tobacco use disorder   ??? Alcohol use   ??? Atrial flutter with rapid ventricular response (CMS-HCC)   ??? Shortness of breath   ??? Chest pain   ??? History of atrial flutter   ??? Bronchiectasis without complication (CMS-HCC)   ??? Rhinovirus infection   ??? Bipolar disorder (CMS-HCC)   ??? Closed displaced fracture of left femoral neck (CMS-HCC)   ??? Acute on chronic respiratory failure with hypoxia (CMS-HCC)   ??? Eosinophilia   ??? Hypereosinophilic syndrome       Reviewed and up to date in Epic.    Appropriateness of Therapy     Is medication and dose appropriate based on diagnosis? Yes    Prescription has been clinically reviewed: Yes    Baseline Quality of Life Assessment      How many days over the past month did your asthma/bronchiectasis  keep you from your normal activities? For example, brushing your teeth or getting up in the morning. daily    Financial Information     Medication Assistance provided: Prior Authorization    Anticipated copay of $3 reviewed with patient. Verified delivery address.    Delivery Information     Scheduled delivery date: 3/21    Expected start date: They will call on Monday to make appt     Medication will be delivered via [facility] to the temporary address in [place].  This shipment will not require a signature.      Explained the services we provide at [facility] and that each month we would call to set up refills.  Stressed importance of returning phone calls so that we could ensure they receive their medications in time each month.  Informed patient that we should be setting up refills 7-10 days prior to when they will run out of medication.  A pharmacist will reach out to perform a clinical assessment periodically.  Informed patient that a welcome packet, containing information about our pharmacy and other support services, a Notice of Privacy Practices, and a drug information handout will be sent.      Patient verbalized understanding of the above information as well as how to contact the pharmacy at [phone] option 4 with any questions/concerns.  The pharmacy is open Monday through Friday 8:30am-4:30pm.  A pharmacist is available 24/7 via pager to answer any clinical questions they may have.  Patient Specific Needs     - Does the patient have any physical, cognitive, or cultural barriers? No    - Patient prefers to have medications discussed with  Caregiver     - Is the patient or caregiver able to read and understand education materials at a high school level or above? Yes    - Patient's primary language is  English     - Is the patient high risk? No    - Does the patient require a Care Management Plan? No     - Does the patient require physician intervention or other additional services (i.e. nutrition, smoking cessation, social work)? No      Julianne Rice  [facility] Specialty Pharmacist

## 2020-12-22 NOTE — Unmapped (Signed)
RX for an EPI-PEN to be used with [employer] sent electronically to [facility] pharmacy.  Pharmacist notified.     Hi Heather,   I'm ready to call Mr. Mckelvy to onboard for his [name]. ??Once I speak with him, I will have the medication sent over to [facility] for administration. ??Could you send over a RX for Epi-pen for him as well to accompany that?     Thanks,   Baker Janus   [facility]   [phone] opt [phone]

## 2020-12-27 DIAGNOSIS — R110 Nausea: Principal | ICD-10-CM

## 2020-12-27 MED ORDER — ONDANSETRON 4 MG DISINTEGRATING TABLET
ORAL_TABLET | Freq: Three times a day (TID) | ORAL | 0 refills | 10.00000 days | Status: CP | PRN
Start: 2020-12-27 — End: ?
  Filled 2021-01-01: qty 30, 10d supply, fill #0

## 2021-01-01 ENCOUNTER — Institutional Professional Consult (permissible substitution): Admit: 2021-01-01 | Discharge: 2021-01-01 | Payer: MEDICAID

## 2021-01-01 ENCOUNTER — Ambulatory Visit: Admit: 2021-01-01 | Discharge: 2021-01-01 | Payer: MEDICAID

## 2021-01-01 DIAGNOSIS — J479 Bronchiectasis, uncomplicated: Principal | ICD-10-CM

## 2021-01-01 MED FILL — PREDNISONE 10 MG TABLET: ORAL | 30 days supply | Qty: 30 | Fill #1

## 2021-01-01 MED FILL — METOPROLOL SUCCINATE ER 50 MG TABLET,EXTENDED RELEASE 24 HR: ORAL | 60 days supply | Qty: 60 | Fill #0

## 2021-01-01 MED FILL — XARELTO 20 MG TABLET: ORAL | 30 days supply | Qty: 30 | Fill #3

## 2021-01-01 MED FILL — SYMBICORT 160 MCG-4.5 MCG/ACTUATION HFA AEROSOL INHALER: RESPIRATORY_TRACT | 30 days supply | Qty: 10.2 | Fill #1

## 2021-01-01 MED FILL — PANTOPRAZOLE 40 MG TABLET,DELAYED RELEASE: ORAL | 60 days supply | Qty: 60 | Fill #0

## 2021-01-01 MED FILL — AZITHROMYCIN 250 MG TABLET: ORAL | 30 days supply | Qty: 30 | Fill #1

## 2021-01-01 MED FILL — PROAIR HFA 90 MCG/ACTUATION AEROSOL INHALER: RESPIRATORY_TRACT | 16 days supply | Qty: 8.5 | Fill #2

## 2021-01-01 NOTE — Unmapped (Signed)
Pt arrived to clinic for first [name] Injection. Pt was given a Therapist, music and was given instruction on how to use the [name] [name] and as well as the Epi pen. Pt along with wife was able to show understanding using the teachback method. Wife was able to give injection to patient without complication. He stayed one hour after for monitoring. No complications noted. Pt was instructed to call should he have issues with subsequent doses.

## 2021-01-05 DIAGNOSIS — J479 Bronchiectasis, uncomplicated: Principal | ICD-10-CM

## 2021-01-05 DIAGNOSIS — J441 Chronic obstructive pulmonary disease with (acute) exacerbation: Principal | ICD-10-CM

## 2021-01-05 DIAGNOSIS — D721 Eosinophilia, unspecified type: Principal | ICD-10-CM

## 2021-01-05 DIAGNOSIS — D72118 Other hypereosinophilic syndrome: Principal | ICD-10-CM

## 2021-01-09 NOTE — Unmapped (Signed)
[facility] Clinic Administered Medication Refill Coordination Note      NAME:[name] DOB: Mar 04, 1956      Medication: [name]  Day Supply: 28 days      SHIPPING      Next delivery from [facility] [phone]) to [facility] [place] for Ravindra Baranek is scheduled for 04/19.    Clinic contact: Lurene Shadow    Patient's next nurse visit for administration: n/a.    We will follow up with clinic monthly for standard refill processing and delivery.      Collin Hendley Samella Parr  Specialty Pharmacy Technician

## 2021-01-15 LAB — ASPERGILLUS SPECIFIC PRECIPITINS
A. FUM IGE CLASS: 0
A. FUMIGATUS IGG: 14.6 ug/mL
A. FUMIGATUS MGD: NEGATIVE
TOTAL IGE (ASP. PRECIP): 79 [IU]/mL — ABNORMAL HIGH

## 2021-01-17 NOTE — Unmapped (Signed)
Refill for hypertonic saline sent.

## 2021-01-19 NOTE — Unmapped (Signed)
Pulmonary Clinic - Initial Visit    Referring Physician :  Elson Crawford  PCP:     Lajoyce Lauber, MD  Reason for Consult:   COPD     HISTORY:     History of Present Illness:  Initial Hx   John Crawford is a 65 y.o. male with a history of A fib/flutter, hx of HFrEF, and COPD whom we are seeing in consultation requested by John Crawford for evaluation of COPD.  Pt was diagnosed 3 years ago when he had increased SOA and recurrent infections. He was hospitalized most recently in 03/2020 for exacerbation and has been hospitalized 3 times in the last year for COPD exacerbations. He has been treated numerous times as an OP. Pt notes that in the am he is extremely fatigued. He coughs up a significant amount of yellow/green sputum in the am. He has persistent cough throughout the day. His SOA has limited his acitivey for several years.  He is on 2L O2 at night which was prescribed after one of his hospitalizations in the past. He normally feels well for several days after getting prednisone and abx but then returns to feeling poorly.     Albuterol inhaler 10x a day, albuterol nebs 2x daily, Symbicort, Spiriva. Had been on advair in past.   Patient smokes 1 cigarette a day. Patient has smoked since 65 years old. He smoked 2-3 packs a day for several years. He stopped for 3 months at one point in time.     Interval History   Since last being seen in clinic patient has had multiple presentations to the ED for shortness of breath or calls to his PCP regarding shortness of breath. For this reason, John Crawford recently underwent consultation with palliative care. He does want to avoid repeated hospitalization but also reports wanting to be able to present to the ED if he is having a flare givent that it may be triggered by something treatable like a viral infection or PNA. He states that he has more good days than bad and that overall he still spends a significant amount of his time doing activities he enjoys and spending time with his wife. He tells me he is not ready for hospice. He states that if his sx are to get worse and he is having more bad days than good or his sx make him unable to enjoy his quality of life, he will reach out to palliative care and I . Since last seeing him, I had increased his symbicort, started him on prednisone daily and restarted azithromcyin. With this regimen John Crawford feels much better. He is in favor of therapies that may help his symptoms but understands that these therapies are not necessarily life prolonging. He continues to wear oxygen at night.     Interval Hx 01/19/21  John Crawford has been having a headache daily for about 2-3 weeks. He has taken a tylenol or aleve that has provided relief. He noticed that it started after he received his Mepolizumab injection. While his breathing is still poor, it is overall improved from 2-3 months ago and he has not had to present to the hospital for shortness of breath. He continues to use his albuterol nebulizer frequently. He continues to work in his shop and enjoy his hobbies. His cough is stable, still not getting much sputum up with airway clearance which he does daily.       Past Medical History:  Past Medical History:  Diagnosis Date   ??? Atrial flutter (CMS-HCC)    ??? Bronchiectasis without complication (CMS-HCC) 08/17/2020   ??? COPD (chronic obstructive pulmonary disease) (CMS-HCC)    ??? Tobacco abuse      Past Surgical History:   Procedure Laterality Date   ??? PR COMPRE EP EVAL ABLTJ 3D MAPG TX SVT N/A 12/15/2019    Procedure: A-Flutter Ablation;  Surgeon: Webb Silversmith, MD;  Location: [facility] EP;  Service: Cardiology       Other History:  The social history and family history were personally reviewed and updated in the patient's electronic medical record.    Family History   Problem Relation Age of Onset   ??? Colon cancer Mother    ??? Heart attack Father    ??? Prostate cancer Father      Social History     Socioeconomic History   ??? Marital status: Married     Spouse name: Not on file   ??? Number of children: Not on file   ??? Years of education: Not on file   ??? Highest education level: Not on file   Occupational History   ??? Occupation: Curator    Tobacco Use   ??? Smoking status: Current Every Day Smoker     Packs/day: 0.15     Types: Cigarettes   ??? Smokeless tobacco: Never Used   ??? Tobacco comment: started smoking at 17, 2ppd for 35 years   Vaping Use   ??? Vaping Use: Never used   Substance and Sexual Activity   ??? Alcohol use: Yes     Comment: 1 to 2 cans of beer daily   ??? Drug use: Never   ??? Sexual activity: Yes   Other Topics Concern   ??? Not on file   Social History Narrative   ??? Not on file     Social Determinants of Health     Financial Resource Strain: Low Risk    ??? Difficulty of Paying Living Expenses: Not hard at all   Food Insecurity: Not on file   Transportation Needs: No Transportation Needs   ??? Lack of Transportation (Medical): No   ??? Lack of Transportation (Non-Medical): No   Physical Activity: Not on file   Stress: Not on file   Social Connections: Not on file       Home Medications:  Current Outpatient Medications on File Prior to Visit   Medication Sig Dispense Refill   ??? albuterol (PROVENTIL HFA;VENTOLIN HFA) 90 mcg/actuation inhaler Inhale 2 puffs every four (4) hours as needed for wheezing or shortness of breath. 8.5 g 11   ??? albuterol 2.5 mg /3 mL (0.083 %) nebulizer solution Inhale 3 mL (2.5 mg total) by nebulization Every four (4) hours. 540 mL 11   ??? azithromycin (ZITHROMAX) 250 MG tablet Take 1 tablet (250 mg total) by mouth daily. 90 tablet 3   ??? blood pressure monitor (BLOOD PRESSURE KIT) Kit Please give 1 kit to patient. 1 kit 0   ??? budesonide-formoteroL (SYMBICORT) 160-4.5 mcg/actuation inhaler Inhale 2 puffs by mouth Two (2) times a day. 10.2 g 6   ??? citalopram (CELEXA) 20 MG tablet Take 1 tablet (20 mg total) by mouth daily. 90 tablet 1   ??? EPINEPHrine (EPIPEN) 0.3 mg/0.3 mL injection Inject 0.3 mL (0.3 mg total) into the muscle once for 1 dose. For emergency use with [name] 1 each 1   ??? mepolizumab ([name]) 100 mg/mL [name] Inject the contents of 1 pen (100 mg) under  the skin every twenty-eight (28) days. 1 mL 6   ??? metoprolol succinate (TOPROL-XL) 50 MG 24 hr tablet Take 1 tablet (50 mg total) by mouth daily. 90 tablet 1   ??? MORPhine 100 mg/5 mL (20 mg/mL) concentrated solution Take 0.5 mL (10 mg total) by mouth every four (4) hours as needed (for shortness of breath). 30 mL 0   ??? ondansetron (ZOFRAN-ODT) 4 MG disintegrating tablet Dissolve 1 tablet (4 mg total) by mouth on tongue every eight (8) hours as needed for nausea 30 tablet 0   ??? pantoprazole (PROTONIX) 40 MG tablet Take 1 tablet (40 mg total) by mouth daily. 90 tablet 1   ??? predniSONE (DELTASONE) 10 MG tablet Take 1 tablet (10 mg total) by mouth daily. 30 tablet 12   ??? rivaroxaban (XARELTO) 20 mg tablet Take 1 tablet (20 mg total) by mouth daily with evening meal. 90 tablet 1   ??? [EXPIRED] sodium chloride 7% 7 % Nebu Inhale 4 mL by nebulization two (2) times a day. 240 mL 2   ??? tiotropium bromide (SPIRIVA RESPIMAT) 2.5 mcg/actuation inhalation mist Inhale 2 puffs by mouth daily. 4 g 11     No current facility-administered medications on file prior to visit.       Allergies:  Allergies as of 01/22/2021   ??? (No Known Allergies)       Review of Systems:  A comprehensive review of systems was completed and negative except as noted in HPI.    PHYSICAL EXAM:     Vitals:    01/22/21 1041   BP: 139/90   Pulse: 81   Resp: 18   Temp: 36.4 ??C   SpO2: 96%     General: Alert and oriented, no acute distress  HEENT: MMM, clear oropharynx  CV: RRR, no m/r/g  Lungs: Diffuse wheezing, symmetric air movement   Abd: ND  Ext: Warm, well perfused, no peripheral edema  Skin: No rashes  Neuro: No focal deficits    LABORATORY and RADIOLOGY DATA:     Pulmonary Function Tests/Interpretation:      6 minute walk with 2L O2 requirement 4/18, previously without O2 need.     STOP BANG 5     Pertinent Laboratory Data:  WBC 8.5  IgG subclasses and IgA, IgM, IgE WNL    Pertinent Imaging Data:  CT 08/16/20  IMPRESSION:  ??  1. Pulmonary nodules measuring up to 0.6 cm (may be impacted airways).      Lung-RADS Category:3S      Follow-up Recommendation:Follow-up chest CT in 6 month.  ??  ??  2. Mild diffuse bronchiectasis and bronchial wall thickening, most prominent in the upper lobes. Etiology is indeterminate but given upper lobe and central predominance, ABPA should be in differential.     ASSESSMENT and PLAN     John Crawford is a 65 y.o. male with a history of A fib/flutter, hx of HFrEF,  COPD, and bronchectasis whom we are seeing in consultation requested by John Crawford for evaluation of COPD. However, pt with screening CT that shows a new diagnosis of bronchiectasis as well 08/2020. In the recent months, John Crawford has had repeated exacerbations prompting palliative care consult by PCP. However, after much discussion, John Crawford feels that he has more good days than bad and has a reasonable quality of life on his current medication regimen. He understands that the therapies he's on will not prolong his life but is open to trying therapies that may help  with his sx.     Pt initially presumed to have COPD with > 3 exacerbation in the last year requiring hospitalization while on triple therapy and multiple more in the OP setting. Teaching on inhaler use has been performed in clinic and patient was started on azithromycin at initial apt due to exacerbations. (Roflumilast is likely cost prohibitive for him). Azithro was briefly paused due to concern for MAC and desire to avoid resistance but AFB culture NEG so restarted given frequent exacerbations. Given his end stage disease and significant improvement with prednsione, I have started John Crawford on prednisone 10mg  daily. He additionally remains on triple therapy. Pt with elevated EOS and improvement with bronchodilator although <268ml improvement in FEV1. For this reason, he has been started on mepolizumab which he has received only 1 injection. Will continue to FU sx relief with injections.     Bronchiectasis noted on screening CT 11/10. This is a new dx for him and while he was hospitalized for a COPD exacerbation an [place] was provided to the patient and workup for etiology of bronchiectasis was initiated. So far, WBC and IgG levels WNL. Alpha one MM (WNL). Sputum cx, fungal, and AFB cx have been negative. Will also consider obtaining ABPA panel as if POS pt may benefit from treatment.  Suspect that many of patients COPD flares have actually been 2/2 his bronchectasis but patient has noted limited benefit from airway clearance more recently.     COPD   - Continue on Symbicort BID, Spiriva, albuterol PRN    - Azithromycin daily   - Prednisone 10mg  daily   - Has albuterol nebs at home to use PRN   - Nursing education on using a spacer at initial apt   - Pt has started Mepolizumab, he has received first dose, wife will start doing them at home after this   - Ordered pulmonary rehab, unable to complete due to location    - with 2L [place] supplemental oxygen requirement. Patient is adamant that he will likely not use this at home consistently. His wife prefers that we order it today she she is worried that if he is to need it, he wont have it if not. We spoke about how his fatigue and confusion may be related to hypoxia while active. I encouraged use but also understand it is his decision on which therapies he chooses to partake in.   - At FU visit will order PFTs and CBC with diff to monitor how he is doing on therapy    Bronchiectasis   - Upper lobe predominant but diffuse bronchiectasis, no prior imaging for comparison   - IgG subclasses and IgA, IgM, IgE WNL  - ABPA panel with IgE 79pt   - Alpha 1 WNL  - Sputum culture, fungal, and AFB 12/21 and 1/22 NEG   -  Aerobika BID with albuterol & HTS 7%    Tobacco Cessation   - Pt not interested in cessation   - Lung Ca CT 9/21 with 0.16mm nodule Lung-RADS 3S, at this time will hold off on repeat imaging due to limited life expectancy and unlikely to tolerate intervention     Daytime Fatigue   - STOP BANG 5  - Sleep study ordered    ?? Influenza: 06/19/2020  ?? Pneumovax:   ?? Prenvar:  08/28/20  ?? Lung cancer screening: See above   ?? Covid-19: 05/24/2020, 06/19/20, 07/19/2020    Immunization History   Administered Date(s) Administered   ??? COVID-19  VACCINE,MRNA(MODERNA)(PF)(IM) 05/24/2020, 06/19/2020, 07/19/2020   ??? Influenza Vaccine Quad (IIV4 PF) 43mo+ injectable 12/16/2019, 06/19/2020   ??? Pneumococcal Conjugate 13-Valent 08/28/2020   ??? TdaP 03/23/2018   ??? Tetanus-Diptheria Toxoids-TD(TDVAX),Asdorbed,2LF(IM) 03/27/2010       Patient will return to clinic in 3 months or sooner if needed.    This patient was seen and discussed with attending physician, Dr. Jefferey Pica who agrees with the assessment and plan above.     [id number] Callie Fielding, Lajoyce Lauber, MD    Esther Hardy

## 2021-01-22 ENCOUNTER — Ambulatory Visit
Admit: 2021-01-22 | Discharge: 2021-01-23 | Payer: MEDICAID | Attending: Student in an Organized Health Care Education/Training Program | Primary: Student in an Organized Health Care Education/Training Program

## 2021-01-22 ENCOUNTER — Ambulatory Visit: Admit: 2021-01-22 | Discharge: 2021-01-23 | Payer: MEDICAID

## 2021-01-22 DIAGNOSIS — J449 Chronic obstructive pulmonary disease, unspecified: Principal | ICD-10-CM

## 2021-01-22 DIAGNOSIS — R110 Nausea: Principal | ICD-10-CM

## 2021-01-22 DIAGNOSIS — J479 Bronchiectasis, uncomplicated: Principal | ICD-10-CM

## 2021-01-22 MED ORDER — SODIUM CHLORIDE 7 % FOR NEBULIZATION
Freq: Two times a day (BID) | RESPIRATORY_TRACT | 6 refills | 30.00000 days | Status: CP
Start: 2021-01-22 — End: 2021-01-22
  Filled 2021-04-04: qty 240, 30d supply, fill #0

## 2021-01-22 MED ORDER — ONDANSETRON 4 MG DISINTEGRATING TABLET
ORAL_TABLET | Freq: Three times a day (TID) | ORAL | 3 refills | 10.00000 days | Status: CP | PRN
Start: 2021-01-22 — End: ?
  Filled 2021-01-22: qty 30, 10d supply, fill #0

## 2021-01-22 MED FILL — PREDNISONE 10 MG TABLET: ORAL | 30 days supply | Qty: 30 | Fill #2

## 2021-01-22 MED FILL — SYMBICORT 160 MCG-4.5 MCG/ACTUATION HFA AEROSOL INHALER: RESPIRATORY_TRACT | 30 days supply | Qty: 10.2 | Fill #2

## 2021-01-22 MED FILL — PROAIR HFA 90 MCG/ACTUATION AEROSOL INHALER: RESPIRATORY_TRACT | 16 days supply | Qty: 8.5 | Fill #3

## 2021-01-22 MED FILL — SPIRIVA RESPIMAT 2.5 MCG/ACTUATION SOLUTION FOR INHALATION: RESPIRATORY_TRACT | 30 days supply | Qty: 4 | Fill #1

## 2021-01-22 NOTE — Unmapped (Addendum)
Thank you for allowing me to be a part of your care. Please call the clinic with any questions.    Please let me know if the headache reoccurs after your next injection   I am placing an order for home supplemental oxygen     Dahlia Byes, MD     Between appointments, you can reach [place] at these numbers:    ?? For appointments or the Pulmonary Nurse: [phone], Fax: [phone]  ?? For emergency issues after hours: Hospital Operator: [phone], ask for Pulmonary Fellow on call    Important Links:  How to use inhaler videos: COPD Inhaler Educational Video Series - COPD Foundation   Pulmonary rehab: Pulmonary Rehabilitation - Department of Medicine ([email or web address]   Smoking cessation: Smoking Cessation - Department of Medicine ([email or web address]

## 2021-01-23 MED FILL — NUCALA 100 MG/ML SUBCUTANEOUS AUTO-INJECTOR: SUBCUTANEOUS | 28 days supply | Qty: 1 | Fill #1

## 2021-01-24 NOTE — Unmapped (Signed)
I saw and evaluated the patient, participating in the key portions of the service.  I reviewed the resident???s note.  I agree with the resident???s findings and plan. Dena Billet, MD

## 2021-01-25 NOTE — Unmapped (Signed)
[facility] Clinical Assessment & Refill Coordination Note    [name] was sent to [facility] [place] but patient will be self-injecting at home moving forward.  We will have Courier retrieve it from [place] clinic and mail via UPS to patient's home in time for next injection date on 4/25.     John Crawford, DOB: 1956-05-20  Phone: [phone] (home)     All above HIPAA information was verified with patient's family member, wife, [name].     Was a Nurse, learning disability used for this call? No    Specialty Medication(s):   CF/Pulmonary: -nucala 100mg /ml     Current Outpatient Medications   Medication Sig Dispense Refill   ??? albuterol (PROVENTIL HFA;VENTOLIN HFA) 90 mcg/actuation inhaler Inhale 2 puffs every four (4) hours as needed for wheezing or shortness of breath. 8.5 g 11   ??? albuterol 2.5 mg /3 mL (0.083 %) nebulizer solution Inhale 3 mL (2.5 mg total) by nebulization Every four (4) hours. 540 mL 11   ??? azithromycin (ZITHROMAX) 250 MG tablet Take 1 tablet (250 mg total) by mouth daily. 90 tablet 3   ??? blood pressure monitor (BLOOD PRESSURE KIT) Kit Please give 1 kit to patient. 1 kit 0   ??? budesonide-formoteroL (SYMBICORT) 160-4.5 mcg/actuation inhaler Inhale 2 puffs by mouth Two (2) times a day. 10.2 g 6   ??? citalopram (CELEXA) 20 MG tablet Take 1 tablet (20 mg total) by mouth daily. 90 tablet 1   ??? EPINEPHrine (EPIPEN) 0.3 mg/0.3 mL injection Inject 0.3 mL (0.3 mg total) into the muscle once for 1 dose. For emergency use with [name] 1 each 1   ??? mepolizumab ([name]) 100 mg/mL [name] Inject the contents of 1 pen (100 mg) under the skin every twenty-eight (28) days. 1 mL 6   ??? metoprolol succinate (TOPROL-XL) 50 MG 24 hr tablet Take 1 tablet (50 mg total) by mouth daily. 90 tablet 1   ??? MORPhine 100 mg/5 mL (20 mg/mL) concentrated solution Take 0.5 mL (10 mg total) by mouth every four (4) hours as needed (for shortness of breath). 30 mL 0   ??? ondansetron (ZOFRAN-ODT) 4 MG disintegrating tablet Dissolve 1 tablet (4 mg total) by mouth on tongue every eight (8) hours as needed for nausea 30 tablet 3   ??? pantoprazole (PROTONIX) 40 MG tablet Take 1 tablet (40 mg total) by mouth daily. 90 tablet 1   ??? predniSONE (DELTASONE) 10 MG tablet Take 1 tablet (10 mg total) by mouth daily. 30 tablet 12   ??? rivaroxaban (XARELTO) 20 mg tablet Take 1 tablet (20 mg total) by mouth daily with evening meal. 90 tablet 1   ??? sodium chloride 7% 7 % Nebu Inhale the content of 1 vial (4 mL) by nebulization two (2) times a day. 240 mL 6   ??? tiotropium bromide (SPIRIVA RESPIMAT) 2.5 mcg/actuation inhalation mist Inhale 2 puffs by mouth daily. 4 g 11     No current facility-administered medications for this visit.        Changes to medications: Devonta reports no changes at this time.    No Known Allergies    Changes to allergies: No    SPECIALTY MEDICATION ADHERENCE     [name] 100 mg/ml: 0 days of medicine on hand            Specialty medication(s) dose(s) confirmed: Regimen is correct and unchanged.     Are there any concerns with adherence? No  Adherence counseling provided? Not needed    CLINICAL MANAGEMENT AND INTERVENTION      Clinical Benefit Assessment:    Do you feel the medicine is effective or helping your condition? Patient declined to answer    Clinical Benefit counseling provided? Not needed. They will reassess with MD after 2-3 months on therapy     Adverse Effects Assessment:    Are you experiencing any side effects? No    Are you experiencing difficulty administering your medicine? No    Quality of Life Assessment:    How many days over the past month did your asthma  keep you from your normal activities? For example, brushing your teeth or getting up in the morning. Patient declined to answer    Have you discussed this with your provider? Not needed    Acute Infection Status:    Acute infections noted within Epic:  No active infections  Patient reported infection: None    Therapy Appropriateness:    Is therapy appropriate? Yes, therapy is appropriate and should be continued    DISEASE/MEDICATION-SPECIFIC INFORMATION      For patients on injectable medications: Patient currently has 0 doses left.  Next injection is scheduled for 4/25.    PATIENT SPECIFIC NEEDS     - Does the patient have any physical, cognitive, or cultural barriers? No    - Is the patient high risk? No      - Does the patient require a Care Management Plan? No     - Does the patient require physician intervention or other additional services (i.e. nutrition, smoking cessation, social work)? No      SHIPPING     Specialty Medication(s) to be Shipped:   CF/Pulmonary: -[name]     Other medication(s) to be shipped: No additional medications requested for fill at this time     Changes to insurance: No    Delivery Scheduled: Yes, Expected medication delivery date: 4/22.     Medication will be delivered via UPS to the confirmed prescription address in [facility].    The patient will receive a drug information handout for each medication shipped and additional FDA Medication Guides as required.  Verified that patient has previously received a Conservation officer, historic buildings and a Surveyor, mining.    All of the patient's questions and concerns have been addressed.    Julianne Rice   [facility] Specialty Pharmacist

## 2021-02-04 DIAGNOSIS — J441 Chronic obstructive pulmonary disease with (acute) exacerbation: Principal | ICD-10-CM

## 2021-02-04 DIAGNOSIS — D721 Eosinophilia, unspecified type: Principal | ICD-10-CM

## 2021-02-04 DIAGNOSIS — J479 Bronchiectasis, uncomplicated: Principal | ICD-10-CM

## 2021-02-04 DIAGNOSIS — D72118 Other hypereosinophilic syndrome: Principal | ICD-10-CM

## 2021-02-07 MED ORDER — BUDESONIDE-FORMOTEROL HFA 160 MCG-4.5 MCG/ACTUATION AEROSOL INHALER
Freq: Two times a day (BID) | RESPIRATORY_TRACT | 6 refills | 15 days | Status: CP
Start: 2021-02-07 — End: ?

## 2021-02-07 MED FILL — AZITHROMYCIN 250 MG TABLET: ORAL | 30 days supply | Qty: 30 | Fill #2

## 2021-02-07 MED FILL — CITALOPRAM 20 MG TABLET: ORAL | 90 days supply | Qty: 90 | Fill #1

## 2021-02-07 MED FILL — PROAIR HFA 90 MCG/ACTUATION AEROSOL INHALER: RESPIRATORY_TRACT | 16 days supply | Qty: 8.5 | Fill #4

## 2021-02-13 MED FILL — SYMBICORT 160 MCG-4.5 MCG/ACTUATION HFA AEROSOL INHALER: RESPIRATORY_TRACT | 30 days supply | Qty: 10.2 | Fill #0

## 2021-02-13 MED FILL — SPIRIVA RESPIMAT 2.5 MCG/ACTUATION SOLUTION FOR INHALATION: RESPIRATORY_TRACT | 30 days supply | Qty: 4 | Fill #2

## 2021-02-13 MED FILL — PREDNISONE 10 MG TABLET: ORAL | 30 days supply | Qty: 30 | Fill #3

## 2021-02-13 MED FILL — XARELTO 20 MG TABLET: ORAL | 30 days supply | Qty: 30 | Fill #4

## 2021-02-14 NOTE — Unmapped (Signed)
[facility] Clinical Assessment & Refill Coordination Note    Travares Nelles, [place]: [birth year]  Phone: [phone] (home)     All above HIPAA information was verified with patient's family member, wife, [name].     Was a Nurse, learning disability used for this call? No    Specialty Medication(s):   CF/Pulmonary: -[name] 100mg /ml     Current Outpatient Medications   Medication Sig Dispense Refill   ??? albuterol (PROVENTIL HFA;VENTOLIN HFA) 90 mcg/actuation inhaler Inhale 2 puffs every four (4) hours as needed for wheezing or shortness of breath. 8.5 g 11   ??? albuterol 2.5 mg /3 mL (0.083 %) nebulizer solution Inhale 3 mL (2.5 mg total) by nebulization Every four (4) hours. 540 mL 11   ??? azithromycin (ZITHROMAX) 250 MG tablet Take 1 tablet (250 mg total) by mouth daily. 90 tablet 3   ??? blood pressure monitor (BLOOD PRESSURE KIT) Kit Please give 1 kit to patient. 1 kit 0   ??? budesonide-formoteroL (SYMBICORT) 160-4.5 mcg/actuation inhaler Inhale 2 puffs by mouth Two (2) times a day. 10.2 g 6   ??? citalopram (CELEXA) 20 MG tablet Take 1 tablet (20 mg total) by mouth daily. 90 tablet 1   ??? EPINEPHrine (EPIPEN) 0.3 mg/0.3 mL injection Inject 0.3 mL (0.3 mg total) into the muscle once for 1 dose. For emergency use with [name] 1 each 1   ??? mepolizumab ([name]) 100 mg/mL [name] Inject the contents of 1 pen (100 mg) under the skin every twenty-eight (28) days. 1 mL 6   ??? metoprolol succinate (TOPROL-XL) 50 MG 24 hr tablet Take 1 tablet (50 mg total) by mouth daily. 90 tablet 1   ??? MORPhine 100 mg/5 mL (20 mg/mL) concentrated solution Take 0.5 mL (10 mg total) by mouth every four (4) hours as needed (for shortness of breath). 30 mL 0   ??? ondansetron (ZOFRAN-ODT) 4 MG disintegrating tablet Dissolve 1 tablet (4 mg total) by mouth on tongue every eight (8) hours as needed for nausea 30 tablet 3   ??? pantoprazole (PROTONIX) 40 MG tablet Take 1 tablet (40 mg total) by mouth daily. 90 tablet 1   ??? predniSONE (DELTASONE) 10 MG tablet Take 1 tablet (10 mg total) by mouth daily. 30 tablet 12   ??? rivaroxaban (XARELTO) 20 mg tablet Take 1 tablet (20 mg total) by mouth daily with evening meal. 90 tablet 1   ??? sodium chloride 7% 7 % Nebu Inhale the content of 1 vial (4 mL) by nebulization two (2) times a day. 240 mL 6   ??? tiotropium bromide (SPIRIVA RESPIMAT) 2.5 mcg/actuation inhalation mist Inhale 2 puffs by mouth daily. 4 g 11     No current facility-administered medications for this visit.        Changes to medications: Keyler reports no changes at this time.    No Known Allergies    Changes to allergies: No    SPECIALTY MEDICATION ADHERENCE     [name] 100 mg/ml: 0 days of medicine on hand       Medication Adherence    Patient reported X missed doses in the last month: 0  Specialty Medication: [name] 100mg /ml  Patient is on additional specialty medications: No  Informant: spouse          Specialty medication(s) dose(s) confirmed: Regimen is correct and unchanged.     Are there any concerns with adherence? No    Adherence counseling provided? Not needed    CLINICAL MANAGEMENT  AND INTERVENTION      Clinical Benefit Assessment:    Do you feel the medicine is effective or helping your condition? Yes    Clinical Benefit counseling provided? Progress note from 4/18 shows evidence of clinical benefit    Adverse Effects Assessment:    Are you experiencing any side effects? No    Are you experiencing difficulty administering your medicine? No    Quality of Life Assessment:    How many days over the past month did your bronchiectasis  keep you from your normal activities? For example, brushing your teeth or getting up in the morning. his oxygen has gone down some days but they are able to get it back up    Have you discussed this with your provider? Yes    Acute Infection Status:    Acute infections noted within Epic:  No active infections  Patient reported infection: None    Therapy Appropriateness:    Is therapy appropriate? Yes, therapy is appropriate and should be continued    DISEASE/MEDICATION-SPECIFIC INFORMATION      For patients on injectable medications: Patient currently has 0 doses left.  Next injection is scheduled for 5/23.    PATIENT SPECIFIC NEEDS     - Does the patient have any physical, cognitive, or cultural barriers? No    - Is the patient high risk? No    - Does the patient require a Care Management Plan? No     - Does the patient require physician intervention or other additional services (i.e. nutrition, smoking cessation, social work)? No      SHIPPING     Specialty Medication(s) to be Shipped:   CF/Pulmonary: -[name] 100mg /ml    Other medication(s) to be shipped: No additional medications requested for fill at this time     Changes to insurance: No    Delivery Scheduled: Yes, Expected medication delivery date: 5/17.     Medication will be delivered via UPS to the confirmed prescription address in [facility].    The patient will receive a drug information handout for each medication shipped and additional FDA Medication Guides as required.  Verified that patient has previously received a Conservation officer, historic buildings and a Surveyor, mining.    The patient or caregiver noted above participated in the development of this care plan and knows that they can request review of or adjustments to the care plan at any time.      All of the patient's questions and concerns have been addressed.    Julianne Rice   [facility] Specialty Pharmacist

## 2021-02-19 MED FILL — NUCALA 100 MG/ML SUBCUTANEOUS AUTO-INJECTOR: SUBCUTANEOUS | 28 days supply | Qty: 1 | Fill #2

## 2021-03-06 DIAGNOSIS — K219 Gastro-esophageal reflux disease without esophagitis: Principal | ICD-10-CM

## 2021-03-06 MED ORDER — PANTOPRAZOLE 40 MG TABLET,DELAYED RELEASE
ORAL_TABLET | Freq: Every day | ORAL | 1 refills | 90.00000 days
Start: 2021-03-06 — End: 2021-09-02

## 2021-03-06 MED ORDER — METOPROLOL SUCCINATE ER 50 MG TABLET,EXTENDED RELEASE 24 HR
ORAL_TABLET | Freq: Every day | ORAL | 1 refills | 90 days
Start: 2021-03-06 — End: 2021-09-02

## 2021-03-06 MED FILL — PROAIR HFA 90 MCG/ACTUATION AEROSOL INHALER: RESPIRATORY_TRACT | 16 days supply | Qty: 8.5 | Fill #5

## 2021-03-06 MED FILL — ONDANSETRON 4 MG DISINTEGRATING TABLET: ORAL | 10 days supply | Qty: 30 | Fill #1

## 2021-03-06 MED FILL — SYMBICORT 160 MCG-4.5 MCG/ACTUATION HFA AEROSOL INHALER: RESPIRATORY_TRACT | 30 days supply | Qty: 10.2 | Fill #1

## 2021-03-07 DIAGNOSIS — D72118 Other hypereosinophilic syndrome: Principal | ICD-10-CM

## 2021-03-07 DIAGNOSIS — J441 Chronic obstructive pulmonary disease with (acute) exacerbation: Principal | ICD-10-CM

## 2021-03-07 DIAGNOSIS — J479 Bronchiectasis, uncomplicated: Principal | ICD-10-CM

## 2021-03-07 DIAGNOSIS — D721 Eosinophilia, unspecified type: Principal | ICD-10-CM

## 2021-03-07 MED ORDER — PANTOPRAZOLE 40 MG TABLET,DELAYED RELEASE
ORAL_TABLET | Freq: Every day | ORAL | 2 refills | 90.00000 days | Status: CP
Start: 2021-03-07 — End: 2021-12-04
  Filled 2021-03-08: qty 90, 90d supply, fill #0

## 2021-03-07 MED ORDER — METOPROLOL SUCCINATE ER 50 MG TABLET,EXTENDED RELEASE 24 HR
ORAL_TABLET | Freq: Every day | ORAL | 2 refills | 90 days | Status: CP
Start: 2021-03-07 — End: 2021-12-04
  Filled 2021-03-08: qty 30, 30d supply, fill #0

## 2021-03-08 MED FILL — XARELTO 20 MG TABLET: ORAL | 30 days supply | Qty: 30 | Fill #5

## 2021-03-09 ENCOUNTER — Ambulatory Visit: Admit: 2021-03-09 | Discharge: 2021-03-11 | Payer: MEDICAID

## 2021-03-16 MED ORDER — XARELTO 20 MG TABLET
ORAL_TABLET | Freq: Every day | ORAL | 1 refills | 90.00000 days
Start: 2021-03-16 — End: 2021-09-12

## 2021-03-16 MED ORDER — CITALOPRAM 20 MG TABLET
ORAL_TABLET | Freq: Every day | ORAL | 1 refills | 90.00000 days
Start: 2021-03-16 — End: 2021-09-12

## 2021-03-16 MED ORDER — EPINEPHRINE 0.3 MG/0.3 ML INJECTION, AUTO-INJECTOR
Freq: Once | INTRAMUSCULAR | 1 refills | 1 days
Start: 2021-03-16 — End: 2021-03-16

## 2021-03-16 NOTE — Unmapped (Signed)
Called patient and spoke with his wife about the results of his sleep study. Study recc PAP therapy and titration study. She will speak with Mr. Prieto and get back to clinic and leave a message if he is willing to move forward with this. Additionally, encouraged patient to continue to wear his O2.

## 2021-03-16 NOTE — Unmapped (Signed)
[facility] Refill Coordination Note    Specialty Medication(s) to be Shipped:   CF/Pulmonary: -[name] 100mg /ml    Other medication(s) to be shipped: azithromycin 250mg , prednisone 10mg      John Crawford, DOB: November 11, 1955  Phone: [phone] (home)       All above HIPAA information was verified with patient's wife     Was a translator used for this call? No    Completed refill call assessment today to schedule patient's medication shipment from the [facility] [phone]).  All relevant notes have been reviewed.     Specialty medication(s) and dose(s) confirmed: Regimen is correct and unchanged.   Changes to medications: Woods reports no changes at this time.  Changes to insurance: No  New side effects reported not previously addressed with a pharmacist or physician: None reported  Questions for the pharmacist: No    Confirmed patient received a Conservation officer, historic buildings and a Surveyor, mining with first shipment. The patient will receive a drug information handout for each medication shipped and additional FDA Medication Guides as required.       DISEASE/MEDICATION-SPECIFIC INFORMATION        For patients on injectable medications: Patient currently has 0 doses left.  Next injection is scheduled for 03/26/21.  NO HWG    SPECIALTY MEDICATION ADHERENCE     Medication Adherence    Patient reported X missed doses in the last month: 0  Specialty Medication: [name] 100mg /ml  Patient is on additional specialty medications: No  Patient is on more than two specialty medications: No  Informant: spouse  Reliability of informant: reliable  Reasons for non-adherence: no problems identified  Confirmed plan for next specialty medication refill: delivery by pharmacy  Refills needed for supportive medications: not needed              Were doses missed due to medication being on hold? No        REFERRAL TO PHARMACIST     Referral to the pharmacist: Not needed      [facility]     Shipping address confirmed in Epic.     Delivery Scheduled: Yes, Expected medication delivery date: 03/21/21.     Medication will be delivered via UPS to the prescription address in Epic WAM.    Jasper Loser   [facility] Specialty Technician

## 2021-03-17 MED ORDER — RIVAROXABAN 20 MG TABLET
ORAL_TABLET | Freq: Every day | ORAL | 1 refills | 90.00000 days | Status: CP
Start: 2021-03-17 — End: 2021-09-13

## 2021-03-17 MED ORDER — EPINEPHRINE 0.3 MG/0.3 ML INJECTION, AUTO-INJECTOR
Freq: Once | INTRAMUSCULAR | 1 refills | 1.00000 days | Status: CP
Start: 2021-03-17 — End: 2021-03-17

## 2021-03-17 MED ORDER — CITALOPRAM 20 MG TABLET
ORAL_TABLET | Freq: Every day | ORAL | 1 refills | 90.00000 days | Status: CP
Start: 2021-03-17 — End: 2021-09-13
  Filled 2021-05-07: qty 90, 90d supply, fill #0

## 2021-03-20 MED FILL — PREDNISONE 10 MG TABLET: ORAL | 30 days supply | Qty: 30 | Fill #0

## 2021-03-20 MED FILL — NUCALA 100 MG/ML SUBCUTANEOUS AUTO-INJECTOR: SUBCUTANEOUS | 28 days supply | Qty: 1 | Fill #3

## 2021-03-20 MED FILL — AZITHROMYCIN 250 MG TABLET: ORAL | 30 days supply | Qty: 30 | Fill #0

## 2021-04-02 MED ORDER — XARELTO 20 MG TABLET
ORAL_TABLET | Freq: Every day | ORAL | 1 refills | 90.00000 days
Start: 2021-04-02 — End: 2021-09-29
  Filled 2021-04-04: qty 90, 90d supply, fill #0

## 2021-04-04 MED FILL — PROAIR HFA 90 MCG/ACTUATION AEROSOL INHALER: RESPIRATORY_TRACT | 16 days supply | Qty: 8.5 | Fill #0

## 2021-04-04 MED FILL — ONDANSETRON 4 MG DISINTEGRATING TABLET: ORAL | 10 days supply | Qty: 30 | Fill #0

## 2021-04-04 MED FILL — ALBUTEROL SULFATE 2.5 MG/3 ML (0.083 %) SOLUTION FOR NEBULIZATION: RESPIRATORY_TRACT | 30 days supply | Qty: 540 | Fill #2

## 2021-04-04 MED FILL — METOPROLOL SUCCINATE ER 50 MG TABLET,EXTENDED RELEASE 24 HR: ORAL | 30 days supply | Qty: 30 | Fill #0

## 2021-04-04 MED FILL — SPIRIVA RESPIMAT 2.5 MCG/ACTUATION SOLUTION FOR INHALATION: RESPIRATORY_TRACT | 30 days supply | Qty: 4 | Fill #0

## 2021-04-06 DIAGNOSIS — J479 Bronchiectasis, uncomplicated: Principal | ICD-10-CM

## 2021-04-06 DIAGNOSIS — J441 Chronic obstructive pulmonary disease with (acute) exacerbation: Principal | ICD-10-CM

## 2021-04-06 DIAGNOSIS — D721 Eosinophilia, unspecified type: Principal | ICD-10-CM

## 2021-04-06 DIAGNOSIS — D72118 Other hypereosinophilic syndrome: Principal | ICD-10-CM

## 2021-04-11 NOTE — Unmapped (Signed)
[facility] Refill Coordination Note    Specialty Medication(s) to be Shipped:   CF/Pulmonary: -[name] 100 mg/mL    Other medication(s) to be shipped: azithromycin 250 MG-predniSONE 10 MG-SYMBICORT 160-4.5 mcg/actuation inhaler     John Crawford, DOB: 11-28-1955  Phone: [phone] (home)       All above HIPAA information was verified with patient's family member, John Crawford.     Was a Nurse, learning disability used for this call? No    Completed refill call assessment today to schedule patient's medication shipment from the [facility] [phone]).  All relevant notes have been reviewed.     Specialty medication(s) and dose(s) confirmed: Regimen is correct and unchanged.   Changes to medications: Ha reports no changes at this time.  Changes to insurance: No  New side effects reported not previously addressed with a pharmacist or physician: None reported  Questions for the pharmacist: No    Confirmed patient received a Conservation officer, historic buildings and a Surveyor, mining with first shipment. The patient will receive a drug information handout for each medication shipped and additional FDA Medication Guides as required.       DISEASE/MEDICATION-SPECIFIC INFORMATION        For CF patients: CF [facility] Active? No-not enrolled    SPECIALTY MEDICATION ADHERENCE     Medication Adherence    Patient reported X missed doses in the last month: all  Specialty Medication: [name] 100mg /ml  Patient is on additional specialty medications: No  Patient is on more than two specialty medications: No  Any gaps in refill history greater than 2 weeks in the last 3 months: no  Demonstrates understanding of importance of adherence: yes  Informant: patient  Reliability of informant: reliable  Reasons for non-adherence: no problems identified  Confirmed plan for next specialty medication refill: delivery by pharmacy  Refills needed for supportive medications: not needed              Were doses missed due to medication being on hold? No    [name] 100 mg/mL: 0 days of medicine on hand        REFERRAL TO PHARMACIST     Referral to the pharmacist: Not needed      [name]     Shipping address confirmed in Epic.     Delivery Scheduled: Yes, Expected medication delivery date: 04/17/21.     Medication will be delivered via UPS to the prescription address in Epic WAM.    John Crawford   [facility] Specialty Technician

## 2021-04-16 MED FILL — PREDNISONE 10 MG TABLET: ORAL | 30 days supply | Qty: 30 | Fill #1

## 2021-04-16 MED FILL — SYMBICORT 160 MCG-4.5 MCG/ACTUATION HFA AEROSOL INHALER: RESPIRATORY_TRACT | 30 days supply | Qty: 10.2 | Fill #0

## 2021-04-16 MED FILL — NUCALA 100 MG/ML SUBCUTANEOUS AUTO-INJECTOR: SUBCUTANEOUS | 28 days supply | Qty: 1 | Fill #4

## 2021-04-16 MED FILL — AZITHROMYCIN 250 MG TABLET: ORAL | 30 days supply | Qty: 30 | Fill #1

## 2021-05-07 DIAGNOSIS — J441 Chronic obstructive pulmonary disease with (acute) exacerbation: Principal | ICD-10-CM

## 2021-05-07 DIAGNOSIS — D721 Eosinophilia, unspecified type: Principal | ICD-10-CM

## 2021-05-07 DIAGNOSIS — D72118 Other hypereosinophilic syndrome: Principal | ICD-10-CM

## 2021-05-07 DIAGNOSIS — J479 Bronchiectasis, uncomplicated: Principal | ICD-10-CM

## 2021-05-07 MED FILL — METOPROLOL SUCCINATE ER 50 MG TABLET,EXTENDED RELEASE 24 HR: ORAL | 30 days supply | Qty: 30 | Fill #1

## 2021-05-07 MED FILL — PROAIR HFA 90 MCG/ACTUATION AEROSOL INHALER: RESPIRATORY_TRACT | 16 days supply | Qty: 8.5 | Fill #1

## 2021-05-07 MED FILL — ONDANSETRON 4 MG DISINTEGRATING TABLET: ORAL | 10 days supply | Qty: 30 | Fill #1

## 2021-05-07 MED FILL — SPIRIVA RESPIMAT 2.5 MCG/ACTUATION SOLUTION FOR INHALATION: RESPIRATORY_TRACT | 30 days supply | Qty: 4 | Fill #1

## 2021-05-07 NOTE — Unmapped (Signed)
[facility] Refill Coordination Note    Specialty Medication(s) to be Shipped:   CF/Pulmonary: -nucala 100mg /ml    Other medication(s) to be shipped: Albuterol 2.5mg /36ml,azithromycin 25mg ,symbicort     John Crawford, DOB: 1956/03/22  Phone: [phone] (home)       All above HIPAA information was verified with patient's family member, wife.     Was a Nurse, learning disability used for this call? No    Completed refill call assessment today to schedule patient's medication shipment from the [facility] [phone]).  All relevant notes have been reviewed.     Specialty medication(s) and dose(s) confirmed: Regimen is correct and unchanged.   Changes to medications: Emari reports no changes at this time.  Changes to insurance: No  New side effects reported not previously addressed with a pharmacist or physician: None reported  Questions for the pharmacist: No    Confirmed patient received a Conservation officer, historic buildings and a Surveyor, mining with first shipment. The patient will receive a drug information handout for each medication shipped and additional FDA Medication Guides as required.       DISEASE/MEDICATION-SPECIFIC INFORMATION        For patients on injectable medications: Patient currently has 0 doses left.  Next injection is scheduled for 08/14.    SPECIALTY MEDICATION ADHERENCE     Medication Adherence    Patient reported X missed doses in the last month: 0  Specialty Medication: [name] 100mg /ml Inj  Patient is on additional specialty medications: No  Additional Specialty Medications: Xarelto 20mg   Patient is on more than two specialty medications: No  Any gaps in refill history greater than 2 weeks in the last 3 months: no  Demonstrates understanding of importance of adherence: yes  Informant: spouse  Reliability of informant: reliable  Provider-estimated medication adherence level: good  Patient is at risk for Non-Adherence: No  Reasons for non-adherence: no problems identified  Confirmed plan for next specialty medication refill: delivery by pharmacy  Refills needed for supportive medications: not needed              Were doses missed due to medication being on hold? No    nucala 100 mg/ml: 0 days of medicine on hand         REFERRAL TO PHARMACIST     Referral to the pharmacist: Not needed      [facility]     Shipping address confirmed in Epic.     Delivery Scheduled: Yes, Expected medication delivery date: 08/09.     Medication will be delivered via UPS to the prescription address in Epic WAM.    Antonietta Barcelona   [facility] Specialty Technician

## 2021-05-14 MED FILL — SYMBICORT 160 MCG-4.5 MCG/ACTUATION HFA AEROSOL INHALER: RESPIRATORY_TRACT | 30 days supply | Qty: 10.2 | Fill #1

## 2021-05-14 MED FILL — NUCALA 100 MG/ML SUBCUTANEOUS AUTO-INJECTOR: SUBCUTANEOUS | 28 days supply | Qty: 1 | Fill #5

## 2021-05-14 MED FILL — ALBUTEROL SULFATE 2.5 MG/3 ML (0.083 %) SOLUTION FOR NEBULIZATION: RESPIRATORY_TRACT | 30 days supply | Qty: 540 | Fill #3

## 2021-05-14 MED FILL — AZITHROMYCIN 250 MG TABLET: ORAL | 30 days supply | Qty: 30 | Fill #2

## 2021-05-15 DIAGNOSIS — J479 Bronchiectasis, uncomplicated: Principal | ICD-10-CM

## 2021-05-23 MED FILL — PREDNISONE 10 MG TABLET: ORAL | 30 days supply | Qty: 30 | Fill #2

## 2021-05-31 DIAGNOSIS — R110 Nausea: Principal | ICD-10-CM

## 2021-05-31 MED ORDER — ALBUTEROL SULFATE 2.5 MG/3 ML (0.083 %) SOLUTION FOR NEBULIZATION
RESPIRATORY_TRACT | 11 refills | 30 days | Status: CP
Start: 2021-05-31 — End: 2022-05-31
  Filled 2021-06-08: qty 8.5, 16d supply, fill #0

## 2021-05-31 MED ORDER — ONDANSETRON 4 MG DISINTEGRATING TABLET
ORAL_TABLET | Freq: Three times a day (TID) | ORAL | 3 refills | 10.00000 days | Status: CP | PRN
Start: 2021-05-31 — End: ?
  Filled 2021-06-06: qty 30, 10d supply, fill #0

## 2021-06-04 ENCOUNTER — Ambulatory Visit
Admit: 2021-06-04 | Discharge: 2021-06-05 | Payer: MEDICAID | Attending: Student in an Organized Health Care Education/Training Program | Primary: Student in an Organized Health Care Education/Training Program

## 2021-06-04 DIAGNOSIS — J449 Chronic obstructive pulmonary disease, unspecified: Principal | ICD-10-CM

## 2021-06-04 DIAGNOSIS — K219 Gastro-esophageal reflux disease without esophagitis: Principal | ICD-10-CM

## 2021-06-04 MED ORDER — PANTOPRAZOLE 40 MG TABLET,DELAYED RELEASE
ORAL_TABLET | Freq: Two times a day (BID) | ORAL | 6 refills | 30 days | Status: CP
Start: 2021-06-04 — End: 2022-06-04
  Filled 2021-06-08: qty 60, 30d supply, fill #0

## 2021-06-04 NOTE — Unmapped (Addendum)
Pulmonary rehab link- [email or web address]     Thank you for allowing me to be a part of your care. Please call the clinic with any questions.    - Increase your oxygen to 4L at night  - I ordered your sleep study   - Increase your acid-pill (Protonix) to twice a day   - Only  use Zofran for when you have nausea. You can use tylenol on the day of and for a few days after your [name] injection.     John Byes, MD     Between appointments, you can reach [place] at these numbers:    For appointments or the Pulmonary Nurse: [phone], Fax: [phone]  For emergency issues after hours: Hospital Operator: [phone], ask for Pulmonary Fellow on call    Important Links:  How to use inhaler videos: COPD Inhaler Educational Video Series - [employer]   Pulmonary rehab: Pulmonary Rehabilitation - Department of Medicine ([email or web address]   Smoking cessation: Smoking Cessation - Department of Medicine ([email or web address]

## 2021-06-04 NOTE — Unmapped (Signed)
Pulmonary Clinic - Initial Visit    Referring Physician :  Lajoyce Lauber  PCP:     Lajoyce Lauber, MD  Reason for Consult:   COPD     HISTORY:     History of Present Illness:  Initial Hx   John Crawford is a 65 y.o. male with a history of A fib/flutter, hx of HFrEF, and COPD whom we are seeing in consultation requested by Lajoyce Lauber for evaluation of COPD.  Pt was diagnosed 3 years ago when he had increased SOA and recurrent infections. He was hospitalized most recently in 03/2020 for exacerbation and has been hospitalized 3 times in the last year for COPD exacerbations. He has been treated numerous times as an OP. Pt notes that in the am he is extremely fatigued. He coughs up a significant amount of yellow/green sputum in the am. He has persistent cough throughout the day. His SOA has limited his acitivey for several years.  He is on 2L O2 at night which was prescribed after one of his hospitalizations in the past. He normally feels well for several days after getting prednisone and abx but then returns to feeling poorly.     Albuterol inhaler 10x a day, albuterol nebs 2x daily, Symbicort, Spiriva. Had been on advair in past.   Patient smokes 1 cigarette a day. Patient has smoked since 65 years old. He smoked 2-3 packs a day for several years. He stopped for 3 months at one point in time.     Interval History   Since last being seen in clinic patient has had multiple presentations to the ED for shortness of breath or calls to his PCP regarding shortness of breath. For this reason, John Crawford recently underwent consultation with palliative care. He does want to avoid repeated hospitalization but also reports wanting to be able to present to the ED if he is having a flare givent that it may be triggered by something treatable like a viral infection or PNA. He states that he has more good days than bad and that overall he still spends a significant amount of his time doing activities he enjoys and spending time with his wife. He tells me he is not ready for hospice. He states that if his sx are to get worse and he is having more bad days than good or his sx make him unable to enjoy his quality of life, he will reach out to palliative care and I . Since last seeing him, I had increased his symbicort, started him on prednisone daily and restarted azithromcyin. With this regimen Mr. Honaker feels much better. He is in favor of therapies that may help his symptoms but understands that these therapies are not necessarily life prolonging. He continues to wear oxygen at night.     Interval Hx 01/19/21  John Crawford has been having a headache daily for about 2-3 weeks. He has taken a tylenol or aleve that has provided relief. He noticed that it started after he received his Mepolizumab injection. While his breathing is still poor, it is overall improved from 2-3 months ago and he has not had to present to the hospital for shortness of breath. He continues to use his albuterol nebulizer frequently. He continues to work in his shop and enjoy his hobbies. His cough is stable, still not getting much sputum up with airway clearance which he does daily.     Interval Hx 06/04/21  John Crawford reports O2 sat 94-95%  prior to going to bed. However when he wakes up O2 has been 3-84% prior to getting out of bed. He is wearing 2.5L at night. He has a headache and feels very lethargic when his O2 is this low.     For 2-3 days after getting the nucala injections he has a HA and feels nauseous. He then feels back to normal. He takes zofran daily for reflux sx.     He otherwise has stable shortness of breath and cough. His cough has been dry and he is continuing his airway clearance.     Past Medical History:  Past Medical History:   Diagnosis Date   ??? Atrial flutter (CMS-HCC)    ??? Bronchiectasis without complication (CMS-HCC) 08/17/2020   ??? COPD (chronic obstructive pulmonary disease) (CMS-HCC)    ??? Tobacco abuse      Past Surgical History:   Procedure Laterality Date   ??? PR COMPRE EP EVAL ABLTJ 3D MAPG TX SVT N/A 12/15/2019    Procedure: A-Flutter Ablation;  Surgeon: Webb Silversmith, MD;  Location: [facility] EP;  Service: Cardiology       Other History:  The social history and family history were personally reviewed and updated in the patient's electronic medical record.    Family History   Problem Relation Age of Onset   ??? Colon cancer Mother    ??? Heart attack Father    ??? Prostate cancer Father      Social History     Socioeconomic History   ??? Marital status: Married   Occupational History   ??? Occupation: Curator    Tobacco Use   ??? Smoking status: Current Every Day Smoker     Packs/day: 0.15     Types: Cigarettes   ??? Smokeless tobacco: Never Used   ??? Tobacco comment: started smoking at 17, 2ppd for 35 years   Vaping Use   ??? Vaping Use: Never used   Substance and Sexual Activity   ??? Alcohol use: Yes     Comment: 1 to 2 cans of beer daily   ??? Drug use: Never   ??? Sexual activity: Yes     Social Determinants of Health     Financial Resource Strain: Low Risk    ??? Difficulty of Paying Living Expenses: Not hard at all       Home Medications:  Current Outpatient Medications on File Prior to Visit   Medication Sig Dispense Refill   ??? albuterol (PROVENTIL HFA;VENTOLIN HFA) 90 mcg/actuation inhaler Inhale 2 puffs every four (4) hours as needed for wheezing or shortness of breath. 8.5 g 11   ??? albuterol 2.5 mg /3 mL (0.083 %) nebulizer solution Inhale 3 mL (2.5 mg total) by nebulization Every four (4) hours. 540 mL 11   ??? azithromycin (ZITHROMAX) 250 MG tablet Take 1 tablet (250 mg total) by mouth daily. 90 tablet 3   ??? blood pressure monitor (BLOOD PRESSURE KIT) Kit Please give 1 kit to patient. 1 kit 0   ??? budesonide-formoteroL (SYMBICORT) 160-4.5 mcg/actuation inhaler Inhale 2 puffs by mouth Two (2) times a day. 10.2 g 6   ??? citalopram (CELEXA) 20 MG tablet Take 1 tablet (20 mg total) by mouth daily. 90 tablet 1   ??? EPINEPHrine (EPIPEN) 0.3 mg/0.3 mL injection Inject 0.3 mL (0.3 mg total) into the muscle once for 1 dose. For emergency use with [name] 2 each 1   ??? mepolizumab ([name]) 100 mg/mL [name] Inject the contents of 1 pen (100  mg) under the skin every twenty-eight (28) days. 1 mL 6   ??? metoprolol succinate (TOPROL-XL) 50 MG 24 hr tablet Take 1 tablet (50 mg total) by mouth daily. 90 tablet 2   ??? MORPhine 100 mg/5 mL (20 mg/mL) concentrated solution Take 0.5 mL (10 mg total) by mouth every four (4) hours as needed (for shortness of breath). 30 mL 0   ??? ondansetron (ZOFRAN-ODT) 4 MG disintegrating tablet Dissolve 1 tablet (4 mg total) on tongue every eight (8) hours as needed for nausea 30 tablet 3   ??? pantoprazole (PROTONIX) 40 MG tablet Take 1 tablet (40 mg total) by mouth daily. 90 tablet 2   ??? predniSONE (DELTASONE) 10 MG tablet Take 1 tablet (10 mg total) by mouth daily. 30 tablet 12   ??? rivaroxaban (XARELTO) 20 mg tablet Take 1 tablet (20 mg total) by mouth daily with evening meal. 90 tablet 1   ??? rivaroxaban (XARELTO) 20 mg tablet Take 1 tablet (20 mg total) by mouth daily with evening meal. 90 tablet 1   ??? tiotropium bromide (SPIRIVA RESPIMAT) 2.5 mcg/actuation inhalation mist Inhale 2 puffs by mouth daily. 4 g 11     No current facility-administered medications on file prior to visit.       Allergies:  Allergies as of 06/04/2021   ??? (No Known Allergies)       Review of Systems:  A comprehensive review of systems was completed and negative except as noted in HPI.    PHYSICAL EXAM:     Vitals:    06/04/21 1107   BP: 121/71   Pulse: 64   Temp: 36.3 ??C (97.4 ??F)   SpO2: 94%     General: Alert and oriented, no acute distress  HEENT: MMM, clear oropharynx  CV: RRR, no m/r/g  Lungs: expiratory wheezing at bilateral bases, symmetric air movement   Abd: ND  Ext: Warm, well perfused, no peripheral edema  Skin: No rashes  Neuro: No focal deficits    LABORATORY and RADIOLOGY DATA:     Pulmonary Function Tests/Interpretation:      6 minute walk with 2L O2 requirement 4/18, previously without O2 need.     STOP BANG 5     Pertinent Laboratory Data:  WBC 8.5  IgG subclasses and IgA, IgM, IgE WNL    Pertinent Imaging Data:  CT 08/16/20  IMPRESSION:  ??  1. Pulmonary nodules measuring up to 0.6 cm (may be impacted airways).      Lung-RADS Category:3S      Follow-up Recommendation:Follow-up chest CT in 6 month.  ??  ??  2. Mild diffuse bronchiectasis and bronchial wall thickening, most prominent in the upper lobes. Etiology is indeterminate but given upper lobe and central predominance, ABPA should be in differential.     ASSESSMENT and PLAN     Mr. John Crawford is a 65 y.o. male with a history of A fib/flutter, hx of HFrEF,  COPD, and bronchectasis whom we are seeing in consultation requested by Terri Piedra for evaluation of COPD. However, pt with screening CT that shows a new diagnosis of bronchiectasis as well 08/2020. In early 2022 Mr. Palmeri has had repeated exacerbations prompting palliative care consult by PCP. However, after much discussion, Mr. Ketchum feels that he has more good days than bad and has a reasonable quality of life on his current medication regimen. He understands that the therapies he's on will not prolong his life but is open to trying therapies that may help  with his sx.     Pt initially presumed to have COPD with > 3 exacerbation in the last year requiring hospitalization while on triple therapy and multiple more in the OP setting. Teaching on inhaler use has been performed in clinic and patient was started on azithromycin at initial apt due to exacerbations. (Roflumilast is likely cost prohibitive for him). Azithro was briefly paused due to concern for MAC and desire to avoid resistance but AFB culture NEG so restarted given frequent exacerbations. Given his end stage disease and significant improvement with prednsione, I have started Mr. Francisco on prednisone 10mg  daily. He additionally remains on triple therapy. Pt with elevated EOS and improvement with bronchodilator although <235ml improvement in FEV1. For this reason, he has been started on mepolizumab which he feels has improved his sx despite having some HA and nausea in the 2-3 days following his injection.     Bronchiectasis noted on screening CT 11/10. This was a new dx for him and while he was hospitalized for a COPD exacerbation an [place] was provided to the patient and workup for etiology of bronchiectasis was initiated. So far, WBC and IgG levels WNL. Alpha one MM (WNL). Sputum cx, fungal, and AFB cx have been negative. Suspect that many of patients COPD flares have actually been 2/2 his bronchiectasis.     His most recent issue has been hypoxia. However, he is not using his home O2 consistently and specifically notices nighttime hypoxia despite wearing 2.5L [place]. [place] is in place when he wakes up. Will further eval night time O2 need at upcoming sleep study.     COPD   - Continue on Symbicort BID, Spiriva, albuterol PRN    - Azithromycin daily   - Prednisone 10mg  daily   - Has albuterol nebs at home to use PRN   - Nursing education on using a spacer at initial apt   - Continue Mepolizumab   - Nausea and HA with injection: Recc zofran PRN and tylenol   - If sx continue, will consider d/c in the future or transitioning to an  alternative agent   - Ordered pulmonary rehab, unable to complete due to location. Provided with online resource.   - with 2L [place] supplemental oxygen requirement. Patient is adamant that he will likely not use this at home consistently. His wife prefers that we order it today she she is worried that if he is to need it, he wont have it if not. We spoke about how his fatigue and confusion may be related to hypoxia while active. I encouraged use but also understand it is his decision on which therapies he chooses to partake in. If he decides to wear more consistently, will repeat 6 minute walk.     Bronchiectasis   - Upper lobe predominant but diffuse bronchiectasis, no prior imaging for comparison   - IgG subclasses and IgA, IgM, IgE WNL  - ABPA panel with IgE 79pt   - Alpha 1 WNL  - Sputum culture, fungal, and AFB 12/21 and 1/22 NEG   -  Aerobika BID with albuterol & HTS 7%  - Continues to have significant reflux sx, due to patient having severe lung disease prefer to avoid invasive procedures. For this reason, will increase PPI to BID. If continues to have significant sx, will refer to GI. Advised to stop taking Zofran for reflux sx.     Tobacco Cessation   - Pt not interested in cessation   - Lung Ca CT  9/21 with 0.49mm nodule Lung-RADS 3S, at this time will hold off on repeat imaging due to limited life expectancy and unlikely to tolerate intervention     Daytime Fatigue   - STOP BANG 5  - Sleep study with OSA, at that time 1L O2 requirement at night   - Pt using 2.5L at night with desaturations when he wakes up, asked to increase to 4L  - Will FU sleep study for night time O2 requirement     ?? Influenza: 06/19/2020  ?? Pneumovax:   ?? Prenvar:  08/28/20  ?? Lung cancer screening: See above   ?? Covid-19: 05/24/2020, 06/19/20, 07/19/2020    Immunization History   Administered Date(s) Administered   ??? COVID-19 VACCINE,MRNA(MODERNA)(PF)(IM) 05/24/2020, 06/19/2020, 07/19/2020   ??? Influenza Vaccine Quad (IIV4 PF) 32mo+ injectable 12/16/2019, 06/19/2020   ??? Pneumococcal Conjugate 13-Valent 08/28/2020   ??? TdaP 03/23/2018   ??? Tetanus-Diptheria Toxoids-TD(TDVAX),Asdorbed,2LF(IM) 03/27/2010       Patient will return to clinic in 3 months or sooner if needed.    This patient was seen and discussed with attending physician, Dr. Nada Libman who agrees with the assessment and plan above.     UJ:[id number] Kristine Royal, MD    Esther Hardy

## 2021-06-04 NOTE — Unmapped (Signed)
[facility] Pharm[facility] Refill Coordination Note    Specialty Medication(s) to be Shipped:   CF/Pulmonary: -[name] 100mg /ml    Other medication(s) to be shipped: No additional medications requested for fill at this time     John Crawford, DOB: 1956/01/15  Phone: [phone] (home)       All above HIPAA information was verified with patient.     Was a Nurse, learning disability used for this call? No    Completed refill call assessment today to schedule patient's medication shipment from the [facility] [phone]).  All relevant notes have been reviewed.     Specialty medication(s) and dose(s) confirmed: Regimen is correct and unchanged.   Changes to medications: [name] reports no changes at this time.  Changes to insurance: No  New side effects reported not previously addressed with a pharmacist or physician: None reported  Questions for the pharmacist: No    Confirmed patient received a Conservation officer, historic buildings and a Surveyor, mining with first shipment. The patient will receive a drug information handout for each medication shipped and additional FDA Medication Guides as required.       DISEASE/MEDICATION-SPECIFIC INFORMATION        For patients on injectable medications: Patient currently has 0 doses left.  Next injection is scheduled for 06/20/21.    SPECIALTY MEDICATION ADHERENCE     Medication Adherence    Patient reported X missed doses in the last month: 0  Specialty Medication: [name] 100mg /ml  Patient is on additional specialty medications: No  Additional Specialty Medications: Xarelto 20mg   Patient is on more than two specialty medications: No  Informant: patient  Reliability of informant: reliable  Reasons for non-adherence: no problems identified  Confirmed plan for next specialty medication refill: delivery by pharmacy  Refills needed for supportive medications: not needed              Were doses missed due to medication being on hold? No    [name] 100mg /ml  : 0 days of medicine on hand REFERRAL TO PHARMACIST     Referral to the pharmacist: Not needed      [facility]     Shipping address confirmed in Epic.     Delivery Scheduled: Yes, Expected medication delivery date: 06/15/21.     Medication will be delivered via UPS to the prescription address in Epic [place].    [name] M Elisabeth Cara   [facility] Specialty Technician

## 2021-06-06 MED FILL — METOPROLOL SUCCINATE ER 50 MG TABLET,EXTENDED RELEASE 24 HR: ORAL | 30 days supply | Qty: 30 | Fill #2

## 2021-06-06 MED FILL — SYMBICORT 160 MCG-4.5 MCG/ACTUATION HFA AEROSOL INHALER: RESPIRATORY_TRACT | 30 days supply | Qty: 10.2 | Fill #2

## 2021-06-06 MED FILL — AZITHROMYCIN 250 MG TABLET: ORAL | 30 days supply | Qty: 30 | Fill #3

## 2021-06-07 DIAGNOSIS — J441 Chronic obstructive pulmonary disease with (acute) exacerbation: Principal | ICD-10-CM

## 2021-06-07 DIAGNOSIS — J479 Bronchiectasis, uncomplicated: Principal | ICD-10-CM

## 2021-06-07 DIAGNOSIS — D72118 Other hypereosinophilic syndrome: Principal | ICD-10-CM

## 2021-06-07 DIAGNOSIS — D721 Eosinophilia, unspecified type: Principal | ICD-10-CM

## 2021-06-14 MED FILL — PREDNISONE 10 MG TABLET: ORAL | 30 days supply | Qty: 30 | Fill #3

## 2021-06-14 MED FILL — NUCALA 100 MG/ML SUBCUTANEOUS AUTO-INJECTOR: SUBCUTANEOUS | 28 days supply | Qty: 1 | Fill #6

## 2021-06-29 DIAGNOSIS — J479 Bronchiectasis, uncomplicated: Principal | ICD-10-CM

## 2021-06-29 MED ORDER — NUCALA 100 MG/ML SUBCUTANEOUS AUTO-INJECTOR
SUBCUTANEOUS | 6 refills | 28 days | Status: CP
Start: 2021-06-29 — End: ?
  Filled 2021-07-09: qty 1, 28d supply, fill #0

## 2021-06-29 NOTE — Unmapped (Signed)
[facility] Refill Coordination Note    Specialty Medication(s) to be Shipped:   CF/Pulmonary: -[name] 100mg /ml & Xarelton 20mg   Other medication(s) to be shipped: Azithromycin 250mg , Metoprolol 50mg , Ondansetron 4mg , Pantoprazole 40mg , Proair HFA , Spiriva Respimat & Symbicort 160-4.85mcg HFA     John Crawford, DOB: July 25, 1956  Phone: [phone] (home)     All above HIPAA information was verified with patient's family member, Wife, John Crawford.     Was a Nurse, learning disability used for this call? No    Completed refill call assessment today to schedule patient's medication shipment from the [facility] [phone]).  All relevant notes have been reviewed.     Specialty medication(s) and dose(s) confirmed: Regimen is correct and unchanged.   Changes to medications: Orbie reports no changes at this time.  Changes to insurance: No  New side effects reported not previously addressed with a pharmacist or physician: None reported  Questions for the pharmacist: No    Confirmed patient received a Conservation officer, historic buildings and a Surveyor, mining with first shipment. The patient will receive a drug information handout for each medication shipped and additional FDA Medication Guides as required.       DISEASE/MEDICATION-SPECIFIC INFORMATION        For CF patients: CF [facility] Active? No-not enrolled    SPECIALTY MEDICATION ADHERENCE     Medication Adherence    Specialty Medication: [name] 100mg /ml  Patient is on additional specialty medications: Yes  Additional Specialty Medications: Xarelto 20mg   Patient Reported Additional Medication X Missed Doses in the Last Month: 0  Patient is on more than two specialty medications: No  Informant: spouse  Reliability of informant: reliable  Reasons for non-adherence: no problems identified  Confirmed plan for next specialty medication refill: delivery by pharmacy  Refills needed for supportive medications: not needed        Were doses missed due to medication being on hold? No    [name] 100mg /ml : 0 days of medicine on hand (Next Injection: 07/12/21)  Xarelto 20mg : 11 days of medicine on hand     REFERRAL TO PHARMACIST     Referral to the pharmacist: Not needed    [facility]     Shipping address confirmed in Epic.     Delivery Scheduled: Yes, Expected medication delivery date: 07/10/2021.     Medication will be delivered via UPS to the prescription address in Epic [place].    Salote Weidmann P Wetzel [place] [facility] Specialty Technician

## 2021-07-02 MED FILL — SPIRIVA RESPIMAT 2.5 MCG/ACTUATION SOLUTION FOR INHALATION: RESPIRATORY_TRACT | 30 days supply | Qty: 4 | Fill #2

## 2021-07-02 MED FILL — SYMBICORT 160 MCG-4.5 MCG/ACTUATION HFA AEROSOL INHALER: RESPIRATORY_TRACT | 30 days supply | Qty: 10.2 | Fill #3

## 2021-07-02 MED FILL — PROAIR HFA 90 MCG/ACTUATION AEROSOL INHALER: RESPIRATORY_TRACT | 16 days supply | Qty: 8.5 | Fill #0

## 2021-07-07 DIAGNOSIS — J479 Bronchiectasis, uncomplicated: Principal | ICD-10-CM

## 2021-07-07 DIAGNOSIS — D721 Eosinophilia, unspecified type: Principal | ICD-10-CM

## 2021-07-07 DIAGNOSIS — J441 Chronic obstructive pulmonary disease with (acute) exacerbation: Principal | ICD-10-CM

## 2021-07-07 DIAGNOSIS — D72118 Other hypereosinophilic syndrome: Principal | ICD-10-CM

## 2021-07-09 MED FILL — METOPROLOL SUCCINATE ER 50 MG TABLET,EXTENDED RELEASE 24 HR: ORAL | 30 days supply | Qty: 30 | Fill #3

## 2021-07-09 MED FILL — AZITHROMYCIN 250 MG TABLET: ORAL | 30 days supply | Qty: 30 | Fill #4

## 2021-07-09 MED FILL — ONDANSETRON 4 MG DISINTEGRATING TABLET: ORAL | 10 days supply | Qty: 30 | Fill #1

## 2021-07-09 MED FILL — PANTOPRAZOLE 40 MG TABLET,DELAYED RELEASE: ORAL | 30 days supply | Qty: 60 | Fill #0

## 2021-07-09 MED FILL — XARELTO 20 MG TABLET: ORAL | 90 days supply | Qty: 90 | Fill #1

## 2021-07-24 MED FILL — PREDNISONE 10 MG TABLET: ORAL | 30 days supply | Qty: 30 | Fill #4

## 2021-08-01 MED ORDER — ALBUTEROL SULFATE HFA 90 MCG/ACTUATION INHALER T-HOME
RESPIRATORY_TRACT | 11 refills | 16 days | PRN
Start: 2021-08-01 — End: 2022-08-01

## 2021-08-01 NOTE — Unmapped (Signed)
[facility] Clinical Assessment & Refill Coordination Note    Spyros Winch, [place]: Mar 19, 1956  Phone: [phone] (home)     All above HIPAA information was verified with patient's family member, wife [name]).     Was a Nurse, learning disability used for this call? No    Specialty Medication(s):   CF/Pulmonary: -[name] 100 mg/mL     Current Outpatient Medications   Medication Sig Dispense Refill   ??? albuterol (PROVENTIL HFA;VENTOLIN HFA) 90 mcg/actuation inhaler Inhale 2 puffs every four (4) hours as needed for wheezing or shortness of breath. 8.5 g 11   ??? albuterol 2.5 mg /3 mL (0.083 %) nebulizer solution Inhale 3 mL (2.5 mg total) by nebulization Every four (4) hours. 540 mL 11   ??? azithromycin (ZITHROMAX) 250 MG tablet Take 1 tablet (250 mg total) by mouth daily. 90 tablet 3   ??? blood pressure monitor (BLOOD PRESSURE KIT) Kit Please give 1 kit to patient. 1 kit 0   ??? budesonide-formoteroL (SYMBICORT) 160-4.5 mcg/actuation inhaler Inhale 2 puffs by mouth Two (2) times a day. 10.2 g 6   ??? citalopram (CELEXA) 20 MG tablet Take 1 tablet (20 mg total) by mouth daily. 90 tablet 1   ??? EPINEPHrine (EPIPEN) 0.3 mg/0.3 mL injection Inject 0.3 mL (0.3 mg total) into the muscle once for 1 dose. For emergency use with [name] 2 each 1   ??? mepolizumab ([name]) 100 mg/mL [name] Inject the contents of 1 pen (100 mg) under the skin every twenty-eight (28) days. 1 mL 6   ??? metoprolol succinate (TOPROL-XL) 50 MG 24 hr tablet Take 1 tablet (50 mg total) by mouth daily. 90 tablet 2   ??? ondansetron (ZOFRAN-ODT) 4 MG disintegrating tablet Dissolve 1 tablet (4 mg total) on tongue every eight (8) hours as needed for nausea 30 tablet 3   ??? pantoprazole (PROTONIX) 40 MG tablet Take 1 tablet (40 mg total) by mouth Two (2) times a day. 60 tablet 6   ??? predniSONE (DELTASONE) 10 MG tablet Take 1 tablet (10 mg total) by mouth daily. 30 tablet 12   ??? rivaroxaban (XARELTO) 20 mg tablet Take 1 tablet (20 mg total) by mouth daily with evening meal. 90 tablet 1   ??? rivaroxaban (XARELTO) 20 mg tablet Take 1 tablet (20 mg total) by mouth daily with evening meal. 90 tablet 1   ??? tiotropium bromide (SPIRIVA RESPIMAT) 2.5 mcg/actuation inhalation mist Inhale 2 puffs by mouth daily. 4 g 11     No current facility-administered medications for this visit.        Changes to medications: Taedyn reports no changes at this time.    No Known Allergies     Changes to allergies: No    SPECIALTY MEDICATION ADHERENCE     [name] 100 mg/ml: 0 days of medicine on hand     Medication Adherence    Patient reported X missed doses in the last month: 0  Specialty Medication: [name] 100 mg/mL every 4 weeks  Patient is on additional specialty medications: No  Informant: patient          Specialty medication(s) dose(s) confirmed: Regimen is correct and unchanged.     Are there any concerns with adherence? No    Adherence counseling provided? Not needed    CLINICAL MANAGEMENT AND INTERVENTION      Clinical Benefit Assessment:    Do you feel the medicine is effective or helping your condition? Yes - Wife stated he has  fewer hospitalizations since starting [name]    Clinical Benefit counseling provided? Not needed    Adverse Effects Assessment:    Are you experiencing any side effects? No    Are you experiencing difficulty administering your medicine? No    Quality of Life Assessment:    Quality of Life    Rheumatology  Oncology  Dermatology  Cystic Fibrosis          How many days over the past month did your bronchiectasis  keep you from your normal activities? For example, brushing your teeth or getting up in the morning. Patient declined to answer    Have you discussed this with your provider? Not needed    Acute Infection Status:    Acute infections noted within Epic:  No active infections  Patient reported infection: None    Therapy Appropriateness:    Is therapy appropriate and patient progressing towards therapeutic goals? Yes, therapy is appropriate and should be continued    DISEASE/MEDICATION-SPECIFIC INFORMATION      For patients on injectable medications: Patient currently has 0 doses left.  Next injection is scheduled for 11/3.    PATIENT SPECIFIC NEEDS     - Does the patient have any physical, cognitive, or cultural barriers? No    - Is the patient high risk? No    - Does the patient require a Care Management Plan? No     - Does the patient require physician intervention or other additional services (i.e. nutrition, smoking cessation, social work)? No      SHIPPING     Specialty Medication(s) to be Shipped:   CF/Pulmonary: -[name] 100 mg/mL    Other medication(s) to be shipped: azithromycin 250 mg, metoprolol succinate 50 mg, ondansetron 4 mg, pantoprazole 40 mg, Sprivia Respimar 2.5 mcg/act, Symbicort 160-4.5 mg, and citalopram 20 mg     Changes to insurance: No    Delivery Scheduled: Yes, Expected medication delivery date: 08/03/21.     Medication will be delivered via UPS to the confirmed prescription address in [facility].    The patient will receive a drug information handout for each medication shipped and additional FDA Medication Guides as required.  Verified that patient has previously received a Conservation officer, historic buildings and a Surveyor, mining.    The patient or caregiver noted above participated in the development of this care plan and knows that they can request review of or adjustments to the care plan at any time.      All of the patient's questions and concerns have been addressed.    [name]   [facility] Specialty Pharmacist

## 2021-08-02 MED FILL — SPIRIVA RESPIMAT 2.5 MCG/ACTUATION SOLUTION FOR INHALATION: RESPIRATORY_TRACT | 30 days supply | Qty: 4 | Fill #3

## 2021-08-02 MED FILL — PANTOPRAZOLE 40 MG TABLET,DELAYED RELEASE: ORAL | 30 days supply | Qty: 60 | Fill #1

## 2021-08-02 MED FILL — AZITHROMYCIN 250 MG TABLET: ORAL | 30 days supply | Qty: 30 | Fill #5

## 2021-08-02 MED FILL — NUCALA 100 MG/ML SUBCUTANEOUS AUTO-INJECTOR: SUBCUTANEOUS | 28 days supply | Qty: 1 | Fill #1

## 2021-08-02 MED FILL — CITALOPRAM 20 MG TABLET: ORAL | 90 days supply | Qty: 90 | Fill #1

## 2021-08-02 MED FILL — ONDANSETRON 4 MG DISINTEGRATING TABLET: ORAL | 10 days supply | Qty: 30 | Fill #2

## 2021-08-02 MED FILL — METOPROLOL SUCCINATE ER 50 MG TABLET,EXTENDED RELEASE 24 HR: ORAL | 30 days supply | Qty: 30 | Fill #4

## 2021-08-02 MED FILL — SYMBICORT 160 MCG-4.5 MCG/ACTUATION HFA AEROSOL INHALER: RESPIRATORY_TRACT | 30 days supply | Qty: 10.2 | Fill #4

## 2021-08-06 MED ORDER — ALBUTEROL SULFATE HFA 90 MCG/ACTUATION INHALER T-HOME
RESPIRATORY_TRACT | 11 refills | 16.00000 days | PRN
Start: 2021-08-06 — End: 2022-08-06

## 2021-08-07 DIAGNOSIS — J479 Bronchiectasis, uncomplicated: Principal | ICD-10-CM

## 2021-08-07 DIAGNOSIS — J441 Chronic obstructive pulmonary disease with (acute) exacerbation: Principal | ICD-10-CM

## 2021-08-07 DIAGNOSIS — D721 Eosinophilia, unspecified type: Principal | ICD-10-CM

## 2021-08-07 DIAGNOSIS — D72118 Other hypereosinophilic syndrome: Principal | ICD-10-CM

## 2021-08-13 ENCOUNTER — Ambulatory Visit: Admit: 2021-08-13 | Discharge: 2021-08-13 | Payer: MEDICAID

## 2021-08-13 ENCOUNTER — Ambulatory Visit
Admit: 2021-08-13 | Discharge: 2021-08-13 | Payer: MEDICAID | Attending: Student in an Organized Health Care Education/Training Program | Primary: Student in an Organized Health Care Education/Training Program

## 2021-08-13 MED ORDER — PREDNISONE 20 MG TABLET
ORAL_TABLET | Freq: Every day | ORAL | 0 refills | 5.00000 days | Status: CN
Start: 2021-08-13 — End: 2021-08-18
  Filled 2021-08-13: qty 17, 10d supply, fill #0

## 2021-08-13 MED ORDER — ALBUTEROL SULFATE HFA 90 MCG/ACTUATION AEROSOL INHALER
RESPIRATORY_TRACT | 11 refills | 0.00000 days | Status: CP | PRN
Start: 2021-08-13 — End: ?
  Filled 2021-08-13: qty 18, 17d supply, fill #0

## 2021-08-13 NOTE — Unmapped (Signed)
Pulmonary Clinic - Initial Visit    Referring Physician :  Legacy Conversion Provi*  PCP:     Lajoyce Lauber, MD  Reason for Consult:   COPD     HISTORY:     History of Present Illness:  Initial Hx   John Crawford is a 65 y.o. male with a history of A fib/flutter, hx of HFrEF, and COPD whom we are seeing in consultation requested by Legacy Conversion Provi* for evaluation of COPD.  Pt was diagnosed 3 years ago when he had increased SOA and recurrent infections. He was hospitalized most recently in 03/2020 for exacerbation and has been hospitalized 3 times in the last year for COPD exacerbations. He has been treated numerous times as an OP. Pt notes that in the am he is extremely fatigued. He coughs up a significant amount of yellow/green sputum in the am. He has persistent cough throughout the day. His SOA has limited his acitivey for several years.  He is on 2L O2 at night which was prescribed after one of his hospitalizations in the past. He normally feels well for several days after getting prednisone and abx but then returns to feeling poorly.     Albuterol inhaler 10x a day, albuterol nebs 2x daily, Symbicort, Spiriva. Had been on advair in past.   Patient smokes 1 cigarette a day. Patient has smoked since 65 years old. He smoked 2-3 packs a day for several years. He stopped for 3 months at one point in time.     Interval History   Since last being seen in clinic patient has had multiple presentations to the ED for shortness of breath or calls to his PCP regarding shortness of breath. For this reason, John Crawford recently underwent consultation with palliative care. He does want to avoid repeated hospitalization but also reports wanting to be able to present to the ED if he is having a flare givent that it may be triggered by something treatable like a viral infection or PNA. He states that he has more good days than bad and that overall he still spends a significant amount of his time doing activities he enjoys and spending time with his wife. He tells me he is not ready for hospice. He states that if his sx are to get worse and he is having more bad days than good or his sx make him unable to enjoy his quality of life, he will reach out to palliative care and I . Since last seeing him, I had increased his symbicort, started him on prednisone daily and restarted azithromcyin. With this regimen John Crawford feels much better. He is in favor of therapies that may help his symptoms but understands that these therapies are not necessarily life prolonging. He continues to wear oxygen at night.     Interval Hx 01/19/21  John Crawford has been having a headache daily for about 2-3 weeks. He has taken a tylenol or aleve that has provided relief. He noticed that it started after he received his Mepolizumab injection. While his breathing is still poor, it is overall improved from 2-3 months ago and he has not had to present to the hospital for shortness of breath. He continues to use his albuterol nebulizer frequently. He continues to work in his shop and enjoy his hobbies. His cough is stable, still not getting much sputum up with airway clearance which he does daily.     Interval Hx 06/04/21  John Crawford reports O2 sat 94-95%  prior to going to bed. However when he wakes up O2 has been 3-84% prior to getting out of bed. He is wearing 2.5L at night. He has a headache and feels very lethargic when his O2 is this low.     For 2-3 days after getting the nucala injections he has a HA and feels nauseous. He then feels back to normal. He takes zofran daily for reflux sx.     He otherwise has stable shortness of breath and cough. His cough has been dry and he is continuing his airway clearance.     Interval Hx 08/13/21  John Crawford has been feeling poorly for the last 2 weeks. He is suffering from shortness of breath. He denies increased sputum production or volume. No F/C. No recent sick contacts. He did have one episode of chest pain while working that lasted for 5-6 minutes and self resolved. He has also been out of his rescue inhaler. He also complains of a band like headache. He intermittently wears his O2 at home and when not wearing will have O2 70-80s. At night he always wears his O2 and is still in the process of getting a CPAP titration. Denies any increased reflux sx, allergic sx.      Past Medical History:  Past Medical History:   Diagnosis Date   ??? Atrial flutter (CMS-HCC)    ??? Bronchiectasis without complication (CMS-HCC) 08/17/2020   ??? COPD (chronic obstructive pulmonary disease) (CMS-HCC)    ??? Tobacco abuse      Past Surgical History:   Procedure Laterality Date   ??? PR COMPRE EP EVAL ABLTJ 3D MAPG TX SVT N/A 12/15/2019    Procedure: A-Flutter Ablation;  Surgeon: Webb Silversmith, MD;  Location: [facility] EP;  Service: Cardiology       Other History:  The social history and family history were personally reviewed and updated in the patient's electronic medical record.    Family History   Problem Relation Age of Onset   ??? Colon cancer Mother    ??? Heart attack Father    ??? Prostate cancer Father      Social History     Socioeconomic History   ??? Marital status: Married   Occupational History   ??? Occupation: Curator    Tobacco Use   ??? Smoking status: Current Every Day Smoker     Packs/day: 0.15     Types: Cigarettes   ??? Smokeless tobacco: Never Used   ??? Tobacco comment: started smoking at 17, 2ppd for 35 years   Vaping Use   ??? Vaping Use: Never used   Substance and Sexual Activity   ??? Alcohol use: Yes     Comment: 1 to 2 cans of beer daily   ??? Drug use: Never   ??? Sexual activity: Yes     Social Determinants of Health     Financial Resource Strain: Low Risk    ??? Difficulty of Paying Living Expenses: Not hard at all       Home Medications:  Current Outpatient Medications on File Prior to Visit   Medication Sig Dispense Refill   ??? albuterol (PROVENTIL HFA;VENTOLIN HFA) 90 mcg/actuation inhaler Inhale 2 puffs every four (4) hours as needed for wheezing or shortness of breath. 8.5 g 11   ??? albuterol 2.5 mg /3 mL (0.083 %) nebulizer solution Inhale 3 mL (2.5 mg total) by nebulization Every four (4) hours. 540 mL 11   ??? azithromycin (ZITHROMAX) 250 MG tablet Take 1  tablet (250 mg total) by mouth daily. 90 tablet 3   ??? blood pressure monitor (BLOOD PRESSURE KIT) Kit Please give 1 kit to patient. 1 kit 0   ??? budesonide-formoteroL (SYMBICORT) 160-4.5 mcg/actuation inhaler Inhale 2 puffs by mouth Two (2) times a day. 10.2 g 6   ??? citalopram (CELEXA) 20 MG tablet Take 1 tablet (20 mg total) by mouth daily. 90 tablet 1   ??? EPINEPHrine (EPIPEN) 0.3 mg/0.3 mL injection Inject 0.3 mL (0.3 mg total) into the muscle once for 1 dose. For emergency use with [name] 2 each 1   ??? mepolizumab ([name]) 100 mg/mL [name] Inject the contents of 1 pen (100 mg) under the skin every twenty-eight (28) days. 1 mL 6   ??? metoprolol succinate (TOPROL-XL) 50 MG 24 hr tablet Take 1 tablet (50 mg total) by mouth daily. 90 tablet 2   ??? ondansetron (ZOFRAN-ODT) 4 MG disintegrating tablet Dissolve 1 tablet (4 mg total) on tongue every eight (8) hours as needed for nausea 30 tablet 3   ??? pantoprazole (PROTONIX) 40 MG tablet Take 1 tablet (40 mg total) by mouth Two (2) times a day. 60 tablet 6   ??? predniSONE (DELTASONE) 10 MG tablet Take 1 tablet (10 mg total) by mouth daily. 30 tablet 12   ??? rivaroxaban (XARELTO) 20 mg tablet Take 1 tablet (20 mg total) by mouth daily with evening meal. 90 tablet 1   ??? rivaroxaban (XARELTO) 20 mg tablet Take 1 tablet (20 mg total) by mouth daily with evening meal. 90 tablet 1   ??? tiotropium bromide (SPIRIVA RESPIMAT) 2.5 mcg/actuation inhalation mist Inhale 2 puffs by mouth daily. 4 g 11     No current facility-administered medications on file prior to visit.       Allergies:  Allergies as of 08/13/2021   ??? (No Known Allergies)       Review of Systems:  A comprehensive review of systems was completed and negative except as noted in HPI.    PHYSICAL EXAM:     Vitals: 08/13/21 1005   BP: 118/75   Pulse: 67   Temp: 36.8 ??C (98.2 ??F)   SpO2: 95%     General: Alert and oriented, no acute distress  HEENT: MMM, clear oropharynx  CV: RRR, no m/r/g  Lungs: expiratory wheezing at bilateral bases, symmetric air movement   Abd: ND  Ext: Warm, well perfused, no peripheral edema  Skin: No rashes  Neuro: No focal deficits    LABORATORY and RADIOLOGY DATA:     Pulmonary Function Tests/Interpretation:      6 minute walk with 2L O2 requirement 4/18, previously without O2 need.     STOP BANG 5     Pertinent Laboratory Data:  WBC 8.5  IgG subclasses and IgA, IgM, IgE WNL    Pertinent Imaging Data:  CT 08/16/20  IMPRESSION:  ??  1. Pulmonary nodules measuring up to 0.6 cm (may be impacted airways).      Lung-RADS Category:3S      Follow-up Recommendation:Follow-up chest CT in 6 month.  ??  ??  2. Mild diffuse bronchiectasis and bronchial wall thickening, most prominent in the upper lobes. Etiology is indeterminate but given upper lobe and central predominance, ABPA should be in differential.     ASSESSMENT and PLAN     John Crawford is a 65 y.o. male with a history of A fib/flutter, hx of HFrEF,  COPD, and bronchectasis whom we are seeing in consultation requested by Annabelle Harman  Judie Bonus for evaluation of COPD. However, pt with screening CT that shows a new diagnosis of bronchiectasis as well 08/2020. In early 2022 John Crawford has had repeated exacerbations prompting palliative care consult by PCP. However, after much discussion, John Crawford feels that he has more good days than bad and has a reasonable quality of life on his current medication regimen. He understands that the therapies he's on will not prolong his life but is open to trying therapies that may help with his sx.     Pt initially presumed to have COPD with > 3 exacerbation in the last year requiring hospitalization while on triple therapy and multiple more in the OP setting. Teaching on inhaler use has been performed in clinic and patient was started on azithromycin at initial apt due to exacerbations. (Roflumilast is likely cost prohibitive for him). Azithro was briefly paused due to concern for MAC and desire to avoid resistance but AFB culture NEG so restarted given frequent exacerbations. Given his end stage disease and significant improvement with prednsione, I have started John Crawford on prednisone 10mg  daily. He additionally remains on triple therapy. Pt with elevated EOS and improvement with bronchodilator although <250ml improvement in FEV1. For this reason, he has been started on mepolizumab which he feels has improved his sx despite having some HA and nausea in the 2-3 days following his injection.     Bronchiectasis noted on screening CT 11/10. This was a new dx for him and while he was hospitalized for a COPD exacerbation an [place] was provided to the patient and workup for etiology of bronchiectasis was initiated. So far, WBC and IgG levels WNL. Alpha one MM (WNL). Sputum cx, fungal, and AFB cx have been negative. Suspect that many of patients COPD flares have actually been 2/2 his bronchiectasis.     His most recent issue has been hypoxia. However, he is not using his home O2 consistently and specifically notices nighttime hypoxia despite wearing 2.5L John Crawford. Nemacolin is in place when he wakes up. Will further eval night time O2 need at upcoming sleep study.     At 3 month FU: CT lung Ca screening, Prevnar     COPD   - Currently having a flare, will treat with 40mg  prednisone for 5 days, 30mg  for 3 days, 20mg  for two days and then resume his normal 10mg  daily   - Will obtain CXR today   - Continue on Symbicort BID, Spiriva, albuterol PRN    - Azithromycin daily   - Prednisone 10mg  daily once completed tx above    - Has albuterol nebs at home to use PRN   - Nursing education on using a spacer at initial apt   - Continue Mepolizumab   - Nausea and HA with injection: Recc zofran PRN and tylenol   - If sx continue, will consider d/c in the future or transitioning to an alternative agent   - Ordered pulmonary rehab, unable to complete due to location. Provided with online resource.   - with 2L [place] supplemental oxygen requirement. Patient is adamant that he will likely not use this at home consistently. His wife prefers that we order it today she she is worried that if he is to need it, he wont have it if not. We spoke about how his fatigue and confusion may be related to hypoxia while active. I encouraged use but also understand it is his decision on which therapies he chooses to partake in. If he decides to  wear more consistently, will repeat 6 minute walk.     Bronchiectasis   - Upper lobe predominant but diffuse bronchiectasis, no prior imaging for comparison   - IgG subclasses and IgA, IgM, IgE WNL  - ABPA panel with IgE 79pt   - Alpha 1 WNL  - Sputum culture, fungal, and AFB 12/21 and 1/22 NEG   -  Aerobika BID with albuterol & HTS 7%  - Continues to have significant reflux sx, due to patient having severe lung disease prefer to avoid invasive procedures. For this reason, will increase PPI to BID. If continues to have significant sx, will refer to GI. Advised to stop taking Zofran for reflux sx.     Tobacco Cessation   - Pt not interested in cessation   - Lung Ca CT 9/21 with 6mm nodule Lung-RADS 3S, had previously held off on repeat imaging due to limited life-expectancy with repeated ED visits and referral to palliative. However, he is currently having more good days than bad and is overall in a better place on his current regimen. Will discuss at his next visit, repeating a screening CT. Deferred today due to AECOPD.     Daytime Fatigue    - STOP BANG 5  - Sleep study with OSA, at that time 1L O2 requirement at night   - Pt using 2.5L at night with desaturations when he wakes up, asked to increase to 4L  - Patient is to call and schedule CPAP titration study, will FU O2 requirement with this study as well     ?? Influenza: 08/13/21  ?? Pneumovax: Prevnar 20 at next appt *   ?? Prenvar:  08/28/20  ?? Lung cancer screening: See above   ?? Covid-19: 05/24/2020, 06/19/20, 07/19/2020      Immunization History   Administered Date(s) Administered   ??? COVID-19 VACCINE,MRNA(MODERNA)(PF)(IM) 05/24/2020, 06/19/2020, 07/19/2020   ??? Influenza Vaccine Quad (IIV4 PF) 26mo+ injectable 12/16/2019, 06/19/2020   ??? Pneumococcal Conjugate 13-Valent 08/28/2020   ??? TdaP 03/23/2018   ??? Tetanus-Diptheria Toxoids-TD(TDVAX),Asdorbed,2LF(IM) 03/27/2010       Patient will return to clinic in 3 months or sooner if needed.    This patient was seen and discussed with attending physician, Dr. Okey Regal who agrees with the assessment and plan above.     ZO:XWRUEA Conversion Provi*, Lajoyce Lauber, MD    Esther Hardy

## 2021-08-13 NOTE — Unmapped (Addendum)
Thank you for allowing me to be a part of your care. Please call the clinic with any questions.    Call [phone] and ask for [facility] sleep clinic.     Dahlia Byes, MD     Between appointments, you can reach [place] at these numbers:    For appointments or the Pulmonary Nurse: [phone], Fax: [phone]  For emergency issues after hours: Hospital Operator: [phone], ask for Pulmonary Fellow on call  [facility]: [phone]  Pharmacy Assistance Program: [phone]    Important Links:  How to use inhaler videos: COPD Inhaler Educational Video Series - COPD Foundation   Pulmonary rehab: Pulmonary Rehabilitation - Department of Medicine ([email or web address]   Smoking cessation: Smoking Cessation - Department of Medicine ([email or web address]

## 2021-08-24 MED ORDER — RIVAROXABAN 20 MG TABLET
ORAL_TABLET | Freq: Every day | ORAL | 1 refills | 90 days | Status: CP
Start: 2021-08-24 — End: 2022-02-20
  Filled 2021-10-23: qty 90, 90d supply, fill #0

## 2021-08-24 MED ORDER — BUDESONIDE-FORMOTEROL HFA 160 MCG-4.5 MCG/ACTUATION AEROSOL INHALER
Freq: Two times a day (BID) | RESPIRATORY_TRACT | 6 refills | 31.00000 days | Status: CP
Start: 2021-08-24 — End: ?

## 2021-08-24 MED ORDER — CITALOPRAM 20 MG TABLET
ORAL_TABLET | Freq: Every day | ORAL | 1 refills | 90.00000 days | Status: CP
Start: 2021-08-24 — End: 2022-02-20
  Filled 2021-10-23: qty 90, 90d supply, fill #0

## 2021-08-28 NOTE — Unmapped (Signed)
[facility] Refill Coordination Note    Specialty Medication(s) to be Shipped:   CF/Pulmonary: -[name] 100mg /ml  Other medication(s) to be shipped: Azithromycin 250mg , Metoprolol Succ 50mg , Ondansetron 4mg , Pantoprazole 40mg , Prednisone 10mg , Spiriva Respimat 2.82mcg HFA, Symbicort 160-4.69mcg HFA, Ventolin HFA     Delmer Islam, DOB: 1956-07-13  Phone: There are no phone numbers on file.    All above HIPAA information was verified with patient's family member, Wife, [name].     Was a Nurse, learning disability used for this call? No    Completed refill call assessment today to schedule patient's medication shipment from the [facility] [phone]).  All relevant notes have been reviewed.     Specialty medication(s) and dose(s) confirmed: Regimen is correct and unchanged.   Changes to medications: [place] reports no changes at this time.  Changes to insurance: No  New side effects reported not previously addressed with a pharmacist or physician: None reported  Questions for the pharmacist: No    Confirmed patient received a Conservation officer, historic buildings and a Surveyor, mining with first shipment. The patient will receive a drug information handout for each medication shipped and additional FDA Medication Guides as required.       DISEASE/MEDICATION-SPECIFIC INFORMATION        For CF patients: CF [facility] Active? No-not enrolled    SPECIALTY MEDICATION ADHERENCE     Medication Adherence    Patient reported X missed doses in the last month: 0  Specialty Medication: [name] 100mg /ml Inj  Patient is on additional specialty medications: Yes  Additional Specialty Medications: Xarelto 20mg   Patient Reported Additional Medication X Missed Doses in the Last Month: 0  Patient is on more than two specialty medications: No  Informant: spouse  Reliability of informant: reliable  Reasons for non-adherence: no problems identified  Confirmed plan for next specialty medication refill: delivery by pharmacy  Refills needed for supportive medications: not needed        Were doses missed due to medication being on hold? No    [name] 100mg /ml : 0 days of medicine on hand (Next Injection: 09/06/2021)    REFERRAL TO PHARMACIST     Referral to the pharmacist: Not needed    [facility]     Shipping address confirmed in Epic.     Delivery Scheduled: Yes, Expected medication delivery date: 09/04/2021.     Medication will be delivered via UPS to the prescription address in Epic WAM.    Kynnedi Zweig P Wetzel [place] [facility] Specialty Technician

## 2021-09-03 MED FILL — PREDNISONE 10 MG TABLET: ORAL | 30 days supply | Qty: 30 | Fill #5

## 2021-09-03 MED FILL — ONDANSETRON 4 MG DISINTEGRATING TABLET: ORAL | 10 days supply | Qty: 30 | Fill #3

## 2021-09-03 MED FILL — AZITHROMYCIN 250 MG TABLET: ORAL | 30 days supply | Qty: 30 | Fill #6

## 2021-09-03 MED FILL — NUCALA 100 MG/ML SUBCUTANEOUS AUTO-INJECTOR: SUBCUTANEOUS | 28 days supply | Qty: 1 | Fill #2

## 2021-09-03 MED FILL — VENTOLIN HFA 90 MCG/ACTUATION AEROSOL INHALER: RESPIRATORY_TRACT | 17 days supply | Qty: 18 | Fill #1

## 2021-09-03 MED FILL — SYMBICORT 160 MCG-4.5 MCG/ACTUATION HFA AEROSOL INHALER: RESPIRATORY_TRACT | 30 days supply | Qty: 10.2 | Fill #0

## 2021-09-03 MED FILL — SPIRIVA RESPIMAT 2.5 MCG/ACTUATION SOLUTION FOR INHALATION: RESPIRATORY_TRACT | 30 days supply | Qty: 4 | Fill #4

## 2021-09-03 MED FILL — METOPROLOL SUCCINATE ER 50 MG TABLET,EXTENDED RELEASE 24 HR: ORAL | 30 days supply | Qty: 30 | Fill #5

## 2021-09-03 MED FILL — PANTOPRAZOLE 40 MG TABLET,DELAYED RELEASE: ORAL | 30 days supply | Qty: 60 | Fill #2

## 2021-09-06 DIAGNOSIS — J441 Chronic obstructive pulmonary disease with (acute) exacerbation: Principal | ICD-10-CM

## 2021-09-06 DIAGNOSIS — D721 Eosinophilia, unspecified type: Principal | ICD-10-CM

## 2021-09-06 DIAGNOSIS — D72118 Other hypereosinophilic syndrome: Principal | ICD-10-CM

## 2021-09-06 DIAGNOSIS — J479 Bronchiectasis, uncomplicated: Principal | ICD-10-CM

## 2021-09-21 DIAGNOSIS — R110 Nausea: Principal | ICD-10-CM

## 2021-09-21 MED ORDER — ONDANSETRON 4 MG DISINTEGRATING TABLET
ORAL_TABLET | Freq: Three times a day (TID) | ORAL | 3 refills | 10.00000 days | PRN
Start: 2021-09-21 — End: ?

## 2021-09-21 NOTE — Unmapped (Signed)
Pharmacy requesting refill.  Last visit on 08/13/2021.  Next visit on 11/26/2021.  Requested Prescriptions     Pending Prescriptions Disp Refills   ??? ondansetron (ZOFRAN-ODT) 4 MG disintegrating tablet 30 tablet 3     Sig: Dissolve 1 tablet (4 mg total) on tongue every eight (8) hours as needed for nausea     RX routed to Dr. Marlana Salvage for review.

## 2021-09-21 NOTE — Unmapped (Signed)
[facility] Refill Coordination Note    Specialty Medication(s) to be Shipped:   CF/Pulmonary: -[name] 100mg /ml Inj  Other medication(s) to be shipped: Prednisone 10mg , Symbicort 160-4.53mcg HFA, Azithromycin 250mg , Metoprolol Succ 50mg , Pantoprazole 40mg , Ondansetron 4mg      John Crawford, DOB: September 06, 1956  Phone: There are no phone numbers on file.    All above HIPAA information was verified with patient's family member, Wife, John Crawford.     Was a Nurse, learning disability used for this call? No    Completed refill call assessment today to schedule patient's medication shipment from the [facility] [phone]).  All relevant notes have been reviewed.     Specialty medication(s) and dose(s) confirmed: Regimen is correct and unchanged.   Changes to medications: [name] reports no changes at this time.  Changes to insurance: No  New side effects reported not previously addressed with a pharmacist or physician: None reported  Questions for the pharmacist: No    Confirmed patient received a Conservation officer, historic buildings and a Surveyor, mining with first shipment. The patient will receive a drug information handout for each medication shipped and additional FDA Medication Guides as required.       DISEASE/MEDICATION-SPECIFIC INFORMATION        For CF patients: CF Healthwell [facility] Active? No-not enrolled    SPECIALTY MEDICATION ADHERENCE     Medication Adherence    Patient reported X missed doses in the last month: 0  Specialty Medication: [name] 100mg /ml Inj  Patient is on additional specialty medications: Yes  Additional Specialty Medications: Xarelto 20mg   Patient Reported Additional Medication X Missed Doses in the Last Month: 0  Patient is on more than two specialty medications: No  Informant: spouse  Reliability of informant: reliable  Reasons for non-adherence: no problems identified  Confirmed plan for next specialty medication refill: delivery by pharmacy  Refills needed for supportive medications: not needed        Were doses missed due to medication being on hold? No    [name] 100mg /ml Inj: 0 days of medicine on hand     REFERRAL TO PHARMACIST     Referral to the pharmacist: Not needed    [facility]     Shipping address confirmed in Epic.     Delivery Scheduled: Yes, Expected medication delivery date: 09/28/2021.     Medication will be delivered via UPS to the prescription address in Epic WAM.    [name] Specialty Technician

## 2021-09-23 MED ORDER — ONDANSETRON 4 MG DISINTEGRATING TABLET
ORAL_TABLET | Freq: Three times a day (TID) | ORAL | 3 refills | 10.00000 days | Status: CP | PRN
Start: 2021-09-23 — End: ?
  Filled 2021-09-27: qty 30, 10d supply, fill #0

## 2021-09-27 MED FILL — AZITHROMYCIN 250 MG TABLET: ORAL | 30 days supply | Qty: 30 | Fill #7

## 2021-09-27 MED FILL — PANTOPRAZOLE 40 MG TABLET,DELAYED RELEASE: ORAL | 30 days supply | Qty: 60 | Fill #3

## 2021-09-27 MED FILL — PREDNISONE 10 MG TABLET: ORAL | 30 days supply | Qty: 30 | Fill #6

## 2021-09-27 MED FILL — METOPROLOL SUCCINATE ER 50 MG TABLET,EXTENDED RELEASE 24 HR: ORAL | 30 days supply | Qty: 30 | Fill #6

## 2021-09-27 MED FILL — SYMBICORT 160 MCG-4.5 MCG/ACTUATION HFA AEROSOL INHALER: RESPIRATORY_TRACT | 30 days supply | Qty: 10.2 | Fill #1

## 2021-09-27 MED FILL — NUCALA 100 MG/ML SUBCUTANEOUS AUTO-INJECTOR: SUBCUTANEOUS | 28 days supply | Qty: 1 | Fill #3

## 2021-10-07 DIAGNOSIS — J479 Bronchiectasis, uncomplicated: Principal | ICD-10-CM

## 2021-10-07 DIAGNOSIS — D721 Eosinophilia, unspecified type: Principal | ICD-10-CM

## 2021-10-07 DIAGNOSIS — D72118 Other hypereosinophilic syndrome: Principal | ICD-10-CM

## 2021-10-07 DIAGNOSIS — J441 Chronic obstructive pulmonary disease with (acute) exacerbation: Principal | ICD-10-CM

## 2021-10-17 DIAGNOSIS — J479 Bronchiectasis, uncomplicated: Principal | ICD-10-CM

## 2021-10-17 NOTE — Unmapped (Signed)
[facility] Refill Coordination Note    Specialty Medication(s) to be Shipped:   CF/Pulmonary: -[name] 100mg /ml    Other medication(s) to be shipped: Citalopram, Spiriva, Ventolin inh, Xarelto, Prednisone 10mg , Symbicort 160-4.66mcg HFA, Azithromycin 250mg , Metoprolol Succ 50mg , Pantoprazole 40mg , Ondansetron 4mg      Delmer Islam, DOB: [birth year]  Phone: There are no phone numbers on file.      All above HIPAA information was verified with patient's caregiver, [name]     Was a translator used for this call? No    Completed refill call assessment today to schedule patient's medication shipment from the [facility] [phone]).  All relevant notes have been reviewed.     Specialty medication(s) and dose(s) confirmed: Regimen is correct and unchanged.   Changes to medications: Ariq reports no changes at this time.  Changes to insurance: No  New side effects reported not previously addressed with a pharmacist or physician: None reported  Questions for the pharmacist: No    Confirmed patient received a Conservation officer, historic buildings and a Surveyor, mining with first shipment. The patient will receive a drug information handout for each medication shipped and additional FDA Medication Guides as required.       DISEASE/MEDICATION-SPECIFIC INFORMATION        For patients on injectable medications: Patient currently has 0 doses left.  Next injection is scheduled for 10/31/2021.    SPECIALTY MEDICATION ADHERENCE     Medication Adherence    Patient reported X missed doses in the last month: 0  Specialty Medication: [name] 100mg /ml Inj  Patient is on additional specialty medications: No  Informant: patient  Reliability of informant: reliable  Reasons for non-adherence: no problems identified  Confirmed plan for next specialty medication refill: delivery by pharmacy  Refills needed for supportive medications: not needed        Were doses missed due to medication being on hold? No    REFERRAL TO PHARMACIST     Referral to the pharmacist: Not needed      [facility]     Shipping address confirmed in Epic.     Delivery Scheduled: Yes, Expected medication delivery date: 10/24/2021.     Medication will be delivered via UPS to the prescription address in Epic WAM.    Lorelei Pont [facility] Specialty Technician

## 2021-10-23 MED FILL — SPIRIVA RESPIMAT 2.5 MCG/ACTUATION SOLUTION FOR INHALATION: RESPIRATORY_TRACT | 30 days supply | Qty: 4 | Fill #5

## 2021-10-23 MED FILL — PANTOPRAZOLE 40 MG TABLET,DELAYED RELEASE: ORAL | 30 days supply | Qty: 60 | Fill #4

## 2021-10-23 MED FILL — VENTOLIN HFA 90 MCG/ACTUATION AEROSOL INHALER: RESPIRATORY_TRACT | 17 days supply | Qty: 18 | Fill #2

## 2021-10-23 MED FILL — NUCALA 100 MG/ML SUBCUTANEOUS AUTO-INJECTOR: SUBCUTANEOUS | 28 days supply | Qty: 1 | Fill #4

## 2021-10-23 MED FILL — METOPROLOL SUCCINATE ER 50 MG TABLET,EXTENDED RELEASE 24 HR: ORAL | 30 days supply | Qty: 30 | Fill #7

## 2021-10-23 MED FILL — ONDANSETRON 4 MG DISINTEGRATING TABLET: ORAL | 10 days supply | Qty: 30 | Fill #1

## 2021-10-23 MED FILL — SYMBICORT 160 MCG-4.5 MCG/ACTUATION HFA AEROSOL INHALER: RESPIRATORY_TRACT | 30 days supply | Qty: 10.2 | Fill #2

## 2021-10-23 MED FILL — AZITHROMYCIN 250 MG TABLET: ORAL | 30 days supply | Qty: 30 | Fill #8

## 2021-10-23 MED FILL — PREDNISONE 10 MG TABLET: ORAL | 30 days supply | Qty: 30 | Fill #7

## 2021-11-07 DIAGNOSIS — J441 Chronic obstructive pulmonary disease with (acute) exacerbation: Principal | ICD-10-CM

## 2021-11-07 DIAGNOSIS — D72118 Other hypereosinophilic syndrome: Principal | ICD-10-CM

## 2021-11-07 DIAGNOSIS — D721 Eosinophilia, unspecified type: Principal | ICD-10-CM

## 2021-11-07 DIAGNOSIS — J479 Bronchiectasis, uncomplicated: Principal | ICD-10-CM

## 2021-11-13 DIAGNOSIS — J449 Chronic obstructive pulmonary disease, unspecified: Principal | ICD-10-CM

## 2021-11-13 MED ORDER — AZITHROMYCIN 250 MG TABLET
ORAL_TABLET | Freq: Every day | ORAL | 3 refills | 90 days | Status: CP
Start: 2021-11-13 — End: ?
  Filled 2021-12-17: qty 90, 90d supply, fill #0

## 2021-11-13 MED ORDER — SPIRIVA RESPIMAT 2.5 MCG/ACTUATION SOLUTION FOR INHALATION
Freq: Every day | RESPIRATORY_TRACT | 11 refills | 30.00000 days | Status: CP
Start: 2021-11-13 — End: 2022-11-13
  Filled 2021-11-20: qty 4, 30d supply, fill #0

## 2021-11-13 MED ORDER — METOPROLOL SUCCINATE ER 50 MG TABLET,EXTENDED RELEASE 24 HR
ORAL_TABLET | Freq: Every day | ORAL | 2 refills | 90.00000 days | Status: CP
Start: 2021-11-13 — End: 2022-08-12
  Filled 2021-12-17: qty 90, 90d supply, fill #0

## 2021-11-13 NOTE — Unmapped (Signed)
Pharmacy requesting refill.  Last appointment on 08/13/2021.  Next appointment on 11/26/2021.  Requested Prescriptions     Pending Prescriptions Disp Refills   ??? azithromycin (ZITHROMAX) 250 MG tablet 90 tablet 3     Sig: Take 1 tablet (250 mg total) by mouth daily.     RX routed to  Dr. Marlana Salvage for review.

## 2021-11-19 MED ORDER — ALBUTEROL SULFATE 2.5 MG/3 ML (0.083 %) SOLUTION FOR NEBULIZATION
RESPIRATORY_TRACT | 11 refills | 30 days | Status: CP
Start: 2021-11-19 — End: 2022-11-19
  Filled 2021-11-20: qty 540, 30d supply, fill #0

## 2021-11-19 NOTE — Unmapped (Signed)
[facility] Refill Coordination Note    Specialty Medication(s) to be Shipped:   CF/Pulmonary: -[name] 100 mg/mL [name]    Other medication(s) to be shipped:     Ondansetron  Pantoprazole  Symbicort  Ventolin  Sodium chloride 7%  spriva  Albuterol neb     John Crawford, DOB: September 10, 1956  Phone: There are no phone numbers on file.      All above HIPAA information was verified with patient's caregiver, wife     Was a Nurse, learning disability used for this call? No    Completed refill call assessment today to schedule patient's medication shipment from the [facility] [phone]).  All relevant notes have been reviewed.     Specialty medication(s) and dose(s) confirmed: Regimen is correct and unchanged.   Changes to medications: John Crawford reports no changes at this time.  Changes to insurance: No  New side effects reported not previously addressed with a pharmacist or physician: None reported  Questions for the pharmacist: No    Confirmed patient received a Conservation officer, historic buildings and a Surveyor, mining with first shipment. The patient will receive a drug information handout for each medication shipped and additional FDA Medication Guides as required.       DISEASE/MEDICATION-SPECIFIC INFORMATION        For CF patients: CF Healthwell [employer] Active? No-not enrolled    SPECIALTY MEDICATION ADHERENCE     Medication Adherence    Patient reported X missed doses in the last month: 0  Specialty Medication: Xarelto 20mg   Patient is on additional specialty medications: Yes  Additional Specialty Medications: [name] 100mg /ml Inj  Patient Reported Additional Medication X Missed Doses in the Last Month: 0  Patient is on more than two specialty medications: No  Informant: patient  Reliability of informant: reliable  Reasons for non-adherence: no problems identified  Confirmed plan for next specialty medication refill: delivery by pharmacy  Refills needed for supportive medications: not needed [name]: Due on 11/28/21      Were doses missed due to medication being on hold? No      REFERRAL TO PHARMACIST     Referral to the pharmacist: Not needed      [facility]     Shipping address confirmed in Epic.     Delivery Scheduled: Yes, Expected medication delivery date: 11/22/21.     Medication will be delivered via UPS to the prescription address in Epic WAM.    John Crawford   [facility] Specialty Technician

## 2021-11-20 MED FILL — PULMOSAL 7 % SOLUTION FOR NEBULIZATION: RESPIRATORY_TRACT | 30 days supply | Qty: 240 | Fill #1

## 2021-11-20 MED FILL — VENTOLIN HFA 90 MCG/ACTUATION AEROSOL INHALER: RESPIRATORY_TRACT | 17 days supply | Qty: 18 | Fill #3

## 2021-11-20 MED FILL — ONDANSETRON 4 MG DISINTEGRATING TABLET: ORAL | 10 days supply | Qty: 30 | Fill #2

## 2021-11-20 MED FILL — SYMBICORT 160 MCG-4.5 MCG/ACTUATION HFA AEROSOL INHALER: RESPIRATORY_TRACT | 30 days supply | Qty: 10.2 | Fill #3

## 2021-11-20 MED FILL — PANTOPRAZOLE 40 MG TABLET,DELAYED RELEASE: ORAL | 30 days supply | Qty: 60 | Fill #5

## 2021-11-20 MED FILL — NUCALA 100 MG/ML SUBCUTANEOUS AUTO-INJECTOR: SUBCUTANEOUS | 28 days supply | Qty: 1 | Fill #5

## 2021-11-20 MED FILL — PREDNISONE 10 MG TABLET: ORAL | 30 days supply | Qty: 30 | Fill #8

## 2021-11-23 NOTE — Unmapped (Signed)
Pulmonary Clinic - Initial Visit    Referring Physician :  [facility] Provi*  PCP:     Lajoyce Lauber, MD  Reason for Consult:   COPD     HISTORY:     History of Present Illness:  Initial Hx   John Crawford is a 66 y.o. male with a history of A fib/flutter, hx of HFrEF, and COPD whom we are seeing in consultation requested by [facility]* for evaluation of COPD.  Pt was diagnosed 3 years ago when he had increased SOA and recurrent infections. He was hospitalized most recently in 03/2020 for exacerbation and has been hospitalized 3 times in the last year for COPD exacerbations. He has been treated numerous times as an OP. Pt notes that in the am he is extremely fatigued. He coughs up a significant amount of yellow/green sputum in the am. He has persistent cough throughout the day. His SOA has limited his acitivey for several years.  He is on 2L O2 at night which was prescribed after one of his hospitalizations in the past. He normally feels well for several days after getting prednisone and abx but then returns to feeling poorly.     Albuterol inhaler 10x a day, albuterol nebs 2x daily, Symbicort, Spiriva. Had been on advair in past.   Patient smokes 1 cigarette a day. Patient has smoked since 66 years old. He smoked 2-3 packs a day for several years. He stopped for 3 months at one point in time.     Interval History   Since last being seen in clinic patient has had multiple presentations to the ED for shortness of breath or calls to his PCP regarding shortness of breath. For this reason, John Crawford recently underwent consultation with palliative care. He does want to avoid repeated hospitalization but also reports wanting to be able to present to the ED if he is having a flare givent that it may be triggered by something treatable like a viral infection or PNA. He states that he has more good days than bad and that overall he still spends a significant amount of his time doing activities he enjoys and spending time with his wife. He tells me he is not ready for hospice. He states that if his sx are to get worse and he is having more bad days than good or his sx make him unable to enjoy his quality of life, he will reach out to palliative care and I . Since last seeing him, I had increased his symbicort, started him on prednisone daily and restarted azithromcyin. With this regimen John Crawford feels much better. He is in favor of therapies that may help his symptoms but understands that these therapies are not necessarily life prolonging. He continues to wear oxygen at night.     Interval Hx 01/19/21  John Crawford has been having a headache daily for about 2-3 weeks. He has taken a tylenol or aleve that has provided relief. He noticed that it started after he received his Mepolizumab injection. While his breathing is still poor, it is overall improved from 2-3 months ago and he has not had to present to the hospital for shortness of breath. He continues to use his albuterol nebulizer frequently. He continues to work in his shop and enjoy his hobbies. His cough is stable, still not getting much sputum up with airway clearance which he does daily.     Interval Hx 06/04/21  John Crawford reports O2 sat 94-95%  prior to going to bed. However when he wakes up O2 has been 3-84% prior to getting out of bed. He is wearing 2.5L at night. He has a headache and feels very lethargic when his O2 is this low.     For 2-3 days after getting the nucala injections he has a HA and feels nauseous. He then feels back to normal. He takes zofran daily for reflux sx.     He otherwise has stable shortness of breath and cough. His cough has been dry and he is continuing his airway clearance.     Interval Hx 08/13/21  John Crawford has been feeling poorly for the last 2 weeks. He is suffering from shortness of breath. He denies increased sputum production or volume. No F/C. No recent sick contacts. He did have one episode of chest pain while working that lasted for 5-6 minutes and self resolved. He has also been out of his rescue inhaler. He also complains of a band like headache. He intermittently wears his O2 at home and when not wearing will have O2 70-80s. At night he always wears his O2 and is still in the process of getting a CPAP titration. Denies any increased reflux sx, allergic sx.    Interval Hx 11/26/21  John Crawford reports that he has been more tired than usual to the point where he has been in bed for 3 weeks. He denies changes in his breathing, cough, changes in phlegm. He has been wearing his oxygen more often although he is not wearing it out of the house. He also complains of a headache that is located in his temples but also has pain over his sinuses. Of all of his concerns his fatigue is the most prominent.     Past Medi/cal History:  Past Medical History:   Diagnosis Date   ??? Atrial flutter (CMS-HCC)    ??? Bronchiectasis without complication (CMS-HCC) 08/17/2020   ??? COPD (chronic obstructive pulmonary disease) (CMS-HCC)    ??? Tobacco abuse      Past Surgical History:   Procedure Laterality Date   ??? PR COMPRE EP EVAL ABLTJ 3D MAPG TX SVT N/A 12/15/2019    Procedure: A-Flutter Ablation;  Surgeon: Webb Silversmith, MD;  Location: [facility] EP;  Service: Cardiology       Other History:  The social history and family history were personally reviewed and updated in the patient's electronic medical record.    Family History   Problem Relation Age of Onset   ??? Colon cancer Mother    ??? Heart attack Father    ??? Prostate cancer Father      Social History     Socioeconomic History   ??? Marital status: Married   Occupational History   ??? Occupation: Curator    Tobacco Use   ??? Smoking status: Every Day     Packs/day: 0.15     Types: Cigarettes   ??? Smokeless tobacco: Never   ??? Tobacco comments:     started smoking at 17, 2ppd for 35 years   Vaping Use   ??? Vaping Use: Never used   Substance and Sexual Activity   ??? Alcohol use: Yes     Comment: 1 to 2 cans of beer daily   ??? Drug use: Never   ??? Sexual activity: Yes       Home Medications:  Current Outpatient Medications on File Prior to Visit   Medication Sig Dispense Refill   ??? albuterol 2.5 mg /3  mL (0.083 %) nebulizer solution Inhale 3 mL (2.5 mg total) by nebulization Every four (4) hours. 540 mL 11   ??? albuterol HFA 90 mcg/actuation inhaler Inhale 2 puffs every four (4) hours as needed for wheezing. 18 g 11   ??? azithromycin (ZITHROMAX) 250 MG tablet Take 1 tablet (250 mg total) by mouth daily. 90 tablet 3   ??? budesonide-formoteroL (SYMBICORT) 160-4.5 mcg/actuation inhaler Inhale 2 puffs by mouth Two (2) times a day. 10.2 g 6   ??? citalopram (CELEXA) 20 MG tablet Take 1 tablet (20 mg total) by mouth daily. 90 tablet 1   ??? EPINEPHrine (EPIPEN) 0.3 mg/0.3 mL injection Inject 0.3 mL (0.3 mg total) into the muscle once for 1 dose. For emergency use with [name] 2 each 1   ??? mepolizumab ([name]) 100 mg/mL [name] Inject the contents of 1 pen (100 mg) under the skin every twenty-eight (28) days. 1 mL 6   ??? metoprolol succinate (TOPROL-XL) 50 MG 24 hr tablet Take 1 tablet (50 mg total) by mouth daily. 90 tablet 2   ??? ondansetron (ZOFRAN-ODT) 4 MG disintegrating tablet Dissolve 1 tablet (4 mg total) on tongue every eight (8) hours as needed for nausea 30 tablet 3   ??? pantoprazole (PROTONIX) 40 MG tablet Take 1 tablet (40 mg total) by mouth Two (2) times a day. 60 tablet 6   ??? predniSONE (DELTASONE) 10 MG tablet Take 1 tablet (10 mg total) by mouth daily. 30 tablet 12   ??? rivaroxaban (XARELTO) 20 mg tablet Take 1 tablet (20 mg total) by mouth daily with evening meal. 90 tablet 1   ??? sodium chloride 7% 7 % Nebu Inhale the content of 1 vial (4 mL) by nebulization two (2) times a day. 240 mL 6   ??? tiotropium bromide (SPIRIVA RESPIMAT) 2.5 mcg/actuation inhalation mist Inhale 2 puffs by mouth daily. 4 g 11     No current facility-administered medications on file prior to visit.       Allergies:  Allergies as of 11/26/2021   ??? (No Known Allergies)       Review of Systems:  A comprehensive review of systems was completed and negative except as noted in HPI.    PHYSICAL EXAM:     Vitals:    11/26/21 1119   BP: 111/79   Pulse: 75   Temp: 36.6 ??C (97.9 ??F)   SpO2: (!) 83%   Placed on 2L oxygen with improvement to 95%.     General: Alert and oriented, no acute distress  HEENT: MMM, clear oropharynx  CV: RRR, no m/r/g  Lungs: expiratory wheezing at bilateral bases, symmetric air movement   Abd: ND  Ext: Warm, well perfused, no peripheral edema  Skin: No rashes  Neuro: No focal deficits, 5/5 strength in upper and lower extremities, rapid alternating motions intact     LABORATORY and RADIOLOGY DATA:     Pulmonary Function Tests/Interpretation:      6 minute walk with 2L O2 requirement 4/18, previously without O2 need.     STOP BANG 5     Pertinent Laboratory Data:  WBC 8.5  IgG subclasses and IgA, IgM, IgE WNL    Pertinent Imaging Data:  CT 08/16/20  IMPRESSION:  ??  1. Pulmonary nodules measuring up to 0.6 cm (may be impacted airways).      Lung-RADS Category:3S      Follow-up Recommendation:Follow-up chest CT in 6 month.  ??  ??  2. Mild diffuse bronchiectasis and bronchial  wall thickening, most prominent in the upper lobes. Etiology is indeterminate but given upper lobe and central predominance, ABPA should be in differential.     ASSESSMENT and PLAN     John Crawford is a 66 y.o. male with a history of A fib/flutter, hx of HFrEF,  COPD, and bronchectasis whom we are seeing in consultation requested by Terri Piedra for evaluation of COPD. However, pt with screening CT that shows a new diagnosis of bronchiectasis as well 08/2020. In early 2022 John Crawford has had repeated exacerbations prompting palliative care consult by PCP. However, after much discussion, John Crawford feels that he has more good days than bad and has a reasonable quality of life on his current medication regimen. He understands that the therapies he's on will not prolong his life but is open to trying therapies that may help with his sx.     Pt initially presumed to have COPD with > 3 exacerbation in the last year requiring hospitalization while on triple therapy and multiple more in the OP setting. Teaching on inhaler use has been performed in clinic and patient was started on azithromycin at initial apt due to exacerbations. (Roflumilast is likely cost prohibitive for him). Azithro was briefly paused due to concern for MAC and desire to avoid resistance but AFB culture NEG so restarted given frequent exacerbations. Given his end stage disease and significant improvement with prednsione, I have started John Crawford on prednisone 10mg  daily. He additionally remains on triple therapy. Pt with elevated EOS and improvement with bronchodilator although <222ml improvement in FEV1. For this reason, he has been started on mepolizumab which he feels has improved his sx despite having some HA and nausea in the 2-3 days following his injection.     Bronchiectasis noted on screening CT 11/10. This was a new dx for him and while he was hospitalized for a COPD exacerbation an [place] was provided to the patient and workup for etiology of bronchiectasis was initiated. So far, WBC and IgG levels WNL. Alpha one MM (WNL). Sputum cx, fungal, and AFB cx have been negative. Suspect that many of patients COPD flares have actually been 2/2 his bronchiectasis.     During his acute presentation his COPD is actually relatively stable. Concern that he has a sinus infection and possibly is continuing to have CO2 retention and hypoxia at home with occasional non-compliance with oxygen. Will make sure he has adequate O2 at home, have enlisted RTs help with this. Will eval for CO2 retention with ABG to see if he qualifies for AVAPs.     Headache   Sinus Pain   Congestion  - Sounds like sinus infection given prolonged sx and sinus tenderness  - Will treat with 7 days of Augmentin and ordered sinus rinses   - BMP and CBC today     COPD   - Last treated for flare with a slow taper in 11/22  - Continue on 10mg  prednisone daily   - Continue on Symbicort BID, Spiriva, albuterol PRN    - Azithromycin daily   - Prednisone 10mg  daily once completed tx above    - Has albuterol nebs at home to use PRN   - Nursing education on using a spacer at initial apt   - Continue Mepolizumab   - Nausea and HA with injection: Recc zofran PRN and tylenol   - If sx continue, will consider d/c in the future or transitioning to an alternative agent   - Ordered pulmonary  rehab, unable to complete due to location. Provided with online resource.   - with 2L [place] supplemental oxygen requirement.   - Patient reports that his home conc is out and they have not been able to get in contact with [employer]. He is  concerned if the power goes out he will not have enough oxygen. Spoke with resp therapist concerning  patients O2 needs and lack of supplies at home. She attempted to call DME company and found he does not  have a DME company and will set him up with new company. Orders written.   - Ordered ABG to see if qualifies for AVAPs. Venous drawn so will have to return to have this done. Order placed.     Bronchiectasis   - Upper lobe predominant but diffuse bronchiectasis, no prior imaging for comparison   - IgG subclasses and IgA, IgM, IgE WNL  - ABPA panel with IgE 79pt   - Alpha 1 WNL  - Sputum culture, fungal, and AFB 12/21 and 1/22 NEG   -  Aerobika BID with albuterol & HTS 7%  - Continues to have significant reflux sx, due to patient having severe lung disease prefer to avoid invasive procedures. For this reason, will increase PPI to BID. If continues to have significant sx, will refer to GI. Advised to stop taking Zofran for reflux sx.     Tobacco Cessation   - Pt not interested in cessation   - Lung Ca CT 9/21 with 6mm nodule Lung-RADS 3S, had previously held off on repeat imaging due to limited life-expectancy with repeated ED visits and referral to palliative. However, he is currently having more good days than bad and is overall in a better place on his current regimen but has had acute issues the past several visits.     Daytime Fatigue    - STOP BANG 5  - Sleep study with OSA, at that time 1L O2 requirement at night   - Pt using 2.5L at night with desaturations when he wakes up, asked to increase to 4L  - Patient is to call and schedule CPAP titration study, will FU O2 requirement with this study as well   - Will obtain an ABG today to eval for hypercapnia that may qualify for AVAPS    ?? Influenza: 08/13/21  ?? Prenvar:  08/28/20. PCV 2/20  ?? Lung cancer screening: See above   ?? Covid-19: 05/24/2020, 06/19/20, 07/19/2020      Immunization History   Administered Date(s) Administered   ??? COVID-19 VACCINE,MRNA(MODERNA)(PF) 05/24/2020, 06/19/2020, 07/19/2020   ??? Influenza Vaccine Quad (IIV4 PF) 91mo+ injectable 12/16/2019, 06/19/2020, 08/13/2021   ??? Pneumococcal Conjugate 13-Valent 08/28/2020   ??? TdaP 03/23/2018   ??? Tetanus-Diptheria Toxoids-TD(TDVAX),Asdorbed,2LF(IM) 03/27/2010       Patient will return to clinic in 3 months or sooner if needed.    This patient was seen and discussed with attending physician, Dr. Garner Nash who agrees with the assessment and plan above.     ZO:XWR[facility]A [facility] Provi*, Lajoyce Lauber, MD    Esther Hardy

## 2021-11-26 ENCOUNTER — Ambulatory Visit
Admit: 2021-11-26 | Discharge: 2021-11-27 | Payer: MEDICAID | Attending: Student in an Organized Health Care Education/Training Program | Primary: Student in an Organized Health Care Education/Training Program

## 2021-11-26 DIAGNOSIS — J479 Bronchiectasis, uncomplicated: Principal | ICD-10-CM

## 2021-11-26 DIAGNOSIS — J449 Chronic obstructive pulmonary disease, unspecified: Principal | ICD-10-CM

## 2021-11-26 LAB — BASIC METABOLIC PANEL
ANION GAP: 7 mmol/L (ref 5–14)
BLOOD UREA NITROGEN: 13 mg/dL (ref 9–23)
BUN / CREAT RATIO: 18
CALCIUM: 9.2 mg/dL (ref 8.7–10.4)
CHLORIDE: 103 mmol/L (ref 98–107)
CO2: 27.4 mmol/L (ref 20.0–31.0)
CREATININE: 0.72 mg/dL
EGFR CKD-EPI (2021) MALE: >90 mL/min/{1.73_m2} (ref >=60)
GLUCOSE RANDOM: 116 mg/dL — ABNORMAL HIGH (ref 70–99)
POTASSIUM: 4.5 mmol/L (ref 3.4–4.8)
SODIUM: 137 mmol/L (ref 135–145)

## 2021-11-26 LAB — CBC W/ AUTO DIFF
BASOPHILS ABSOLUTE COUNT: 0 10*9/L (ref 0.0–0.1)
BASOPHILS RELATIVE PERCENT: 0.5 %
EOSINOPHILS ABSOLUTE COUNT: 0 10*9/L (ref 0.0–0.5)
EOSINOPHILS RELATIVE PERCENT: 0.2 %
HEMATOCRIT: 40.7 % (ref 39.0–48.0)
HEMOGLOBIN: 14.1 g/dL (ref 12.9–16.5)
LYMPHOCYTES ABSOLUTE COUNT: 0.6 10*9/L — ABNORMAL LOW (ref 1.1–3.6)
LYMPHOCYTES RELATIVE PERCENT: 9.7 %
MEAN CORPUSCULAR HEMOGLOBIN CONC: 34.7 g/dL (ref 32.0–36.0)
MEAN CORPUSCULAR HEMOGLOBIN: 34.8 pg — ABNORMAL HIGH (ref 25.9–32.4)
MEAN CORPUSCULAR VOLUME: 100.3 fL — ABNORMAL HIGH (ref 77.6–95.7)
MEAN PLATELET VOLUME: 9.5 fL (ref 6.8–10.7)
MONOCYTES ABSOLUTE COUNT: 0.6 10*9/L (ref 0.3–0.8)
MONOCYTES RELATIVE PERCENT: 9.1 %
NEUTROPHILS ABSOLUTE COUNT: 5.1 10*9/L (ref 1.8–7.8)
NEUTROPHILS RELATIVE PERCENT: 80.5 %
NUCLEATED RED BLOOD CELLS: 0 /100{WBCs} (ref <=4)
PLATELET COUNT: 151 10*9/L (ref 150–450)
RED BLOOD CELL COUNT: 4.06 10*12/L — ABNORMAL LOW (ref 4.26–5.60)
RED CELL DISTRIBUTION WIDTH: 15.1 % (ref 12.2–15.2)
WBC ADJUSTED: 6.4 10*9/L (ref 3.6–11.2)

## 2021-11-26 LAB — SLIDE REVIEW

## 2021-11-26 MED ORDER — AMOXICILLIN 875 MG-POTASSIUM CLAVULANATE 125 MG TABLET
ORAL_TABLET | Freq: Two times a day (BID) | ORAL | 0 refills | 7 days | Status: CP
Start: 2021-11-26 — End: 2021-12-03
  Filled 2021-11-26: qty 14, 7d supply, fill #0

## 2021-11-26 MED ORDER — SALINE NASAL 0.65 % SPRAY AEROSOL
Freq: Two times a day (BID) | NASAL | 3 refills | 90 days | Status: CP
Start: 2021-11-26 — End: 2022-11-26

## 2021-11-26 NOTE — Unmapped (Signed)
Faxed prescription to [facility] for a home concentrator, a portable oxygen concentrator and an emergency back up tank including all supplies necessary. Patient will be informed via phone. Provider informed.

## 2021-11-26 NOTE — Unmapped (Addendum)
I have sent a prescription to your pharmacy for 7 days of antibiotics.     We will follow up your labs and see if we can get you a sleep machine.     We will help make sure you have enough oxygen at home.     Thank you for allowing me to be a part of your care. Please call the clinic with any questions.    Dahlia Byes, MD     Between appointments, you can reach [place] at these numbers:    For appointments or the Pulmonary Nurse: [phone], Fax: [phone]  For emergency issues after hours: Hospital Operator: [phone], ask for Pulmonary Fellow on call  [facility]: [phone]  Pharmacy Assistance Program: [phone]    Important Links:  How to use inhaler videos: COPD Inhaler Educational Video Series - [employer]   Pulmonary rehab: Pulmonary Rehabilitation - Department of Medicine ([email or web address]   Smoking cessation: Smoking Cessation - Department of Medicine ([email or web address]

## 2021-11-26 NOTE — Unmapped (Signed)
Addended by: Esther Hardy on: 11/26/2021 02:23 PM     Modules accepted: Orders

## 2021-11-27 NOTE — Unmapped (Signed)
[facility] confirmed the patient had received the oxygen equipment and will soon be titrated for a portable oxygen concentrator. Called the patient to confirm and left a voicemail.

## 2021-12-05 ENCOUNTER — Ambulatory Visit: Admit: 2021-12-05 | Discharge: 2021-12-06 | Payer: MEDICAID

## 2021-12-05 DIAGNOSIS — J479 Bronchiectasis, uncomplicated: Principal | ICD-10-CM

## 2021-12-05 DIAGNOSIS — D721 Eosinophilia, unspecified type: Principal | ICD-10-CM

## 2021-12-05 DIAGNOSIS — D72118 Other hypereosinophilic syndrome: Principal | ICD-10-CM

## 2021-12-05 DIAGNOSIS — J441 Chronic obstructive pulmonary disease with (acute) exacerbation: Principal | ICD-10-CM

## 2021-12-05 LAB — BLOOD GAS, ARTERIAL
BASE EXCESS ARTERIAL: 7.6 — ABNORMAL HIGH (ref -2.0–2.0)
FIO2 ARTERIAL: 21
HCO3 ARTERIAL: 31 mmol/L — ABNORMAL HIGH (ref 22–27)
O2 SATURATION ARTERIAL: 96.1 % (ref 94.0–100.0)
PCO2 ARTERIAL: 40 mmHg (ref 35.0–45.0)
PH ARTERIAL: 7.5 — ABNORMAL HIGH (ref 7.35–7.45)
PO2 ARTERIAL: 70.6 mmHg — ABNORMAL LOW (ref 80.0–110.0)

## 2021-12-05 NOTE — Unmapped (Signed)
ARTERIAL BLOOD GAS    Date: 12/05/2021    Time: 1530    Draw site: []  Right radial [x]  Left radial    FiO2:     Positive Modified Allen's test: [x]  YES []  NO    Comments:  Held pressure until bleeding stopped.  Vital signs stable.  Tolerated well.  No apparent distress.

## 2021-12-14 MED ORDER — PREDNISONE 10 MG TABLET
ORAL_TABLET | Freq: Every day | ORAL | 11 refills | 30 days | Status: CP
Start: 2021-12-14 — End: ?
  Filled 2021-12-24: qty 30, 30d supply, fill #0

## 2021-12-14 MED ORDER — PANTOPRAZOLE 40 MG TABLET,DELAYED RELEASE
ORAL_TABLET | Freq: Two times a day (BID) | ORAL | 11 refills | 30 days | Status: CP
Start: 2021-12-14 — End: 2022-12-14
  Filled 2021-12-24: qty 60, 30d supply, fill #0

## 2021-12-14 NOTE — Unmapped (Signed)
Pharmacy requesting refill.  Last appointment on 11/26/2021.  Next appointment on 03/11/2022.  Requested Prescriptions     Pending Prescriptions Disp Refills   ??? predniSONE (DELTASONE) 10 MG tablet 30 tablet 11     Sig: Take 1 tablet (10 mg total) by mouth daily.   ??? pantoprazole (PROTONIX) 40 MG tablet 60 tablet 11     Sig: Take 1 tablet (40 mg total) by mouth Two (2) times a day.     RX routed to Dr. Marlana Salvage for review.

## 2021-12-17 MED FILL — SPIRIVA RESPIMAT 2.5 MCG/ACTUATION SOLUTION FOR INHALATION: RESPIRATORY_TRACT | 30 days supply | Qty: 4 | Fill #1

## 2021-12-17 MED FILL — SYMBICORT 160 MCG-4.5 MCG/ACTUATION HFA AEROSOL INHALER: RESPIRATORY_TRACT | 30 days supply | Qty: 10.2 | Fill #4

## 2021-12-17 MED FILL — VENTOLIN HFA 90 MCG/ACTUATION AEROSOL INHALER: RESPIRATORY_TRACT | 17 days supply | Qty: 18 | Fill #4

## 2021-12-21 NOTE — Unmapped (Signed)
Received voicemail from [facility] regarding [place] Certificate of Medical Necessity form needing to be signed by Dr. Garner Nash.    Called [facility] back to inform them that patient's provider is Dahlia Byes, MD and form needs to be faxed to [phone].  States they will fax form for provider to sign.

## 2021-12-21 NOTE — Unmapped (Signed)
[facility] Refill Coordination Note    Specialty Medication(s) to be Shipped:   CF/Pulmonary: -[name] 100mg /ml Inj  Other medication(s) to be shipped: Citalopram 20mg , Ondansetron 4mg , Pantoprazole 40mg , Prednisone 10mg , Xarelto 20mg      John Crawford, DOB: 1956-07-23  Phone: There are no phone numbers on file.    All above HIPAA information was verified with patient's family member, Wife, [name].     Was a Nurse, learning disability used for this call? No    Completed refill call assessment today to schedule patient's medication shipment from the [facility] [phone]).  All relevant notes have been reviewed.     Specialty medication(s) and dose(s) confirmed: Regimen is correct and unchanged.   Changes to medications: Xaiden reports no changes at this time.  Changes to insurance: No  New side effects reported not previously addressed with a pharmacist or physician: None reported  Questions for the pharmacist: No    Confirmed patient received a Conservation officer, historic buildings and a Surveyor, mining with first shipment. The patient will receive a drug information handout for each medication shipped and additional FDA Medication Guides as required.       DISEASE/MEDICATION-SPECIFIC INFORMATION        For CF patients: CF [facility] Active? No-not enrolled    SPECIALTY MEDICATION ADHERENCE     Medication Adherence    Patient reported X missed doses in the last month: 0  Specialty Medication: [name] 100mg /ml Inj  Patient is on additional specialty medications: No  Patient is on more than two specialty medications: No  Informant: spouse  Reliability of informant: reliable  Reasons for non-adherence: no problems identified  Confirmed plan for next specialty medication refill: delivery by pharmacy  Refills needed for supportive medications: not needed        Were doses missed due to medication being on hold? No    [name] 100mg /ml Inj: 0 days of medicine on hand (Next Injection: 01/02/22 but wife not sure)    REFERRAL TO PHARMACIST     Referral to the pharmacist: Not needed    [facility]     Shipping address confirmed in Epic.     Delivery Scheduled: Yes, Expected medication delivery date: 12/25/2021.     Medication will be delivered via UPS to the prescription address in Epic WAM.    Morio Widen P Wetzel [facility] [facility] Specialty Technician

## 2021-12-24 MED FILL — NUCALA 100 MG/ML SUBCUTANEOUS AUTO-INJECTOR: SUBCUTANEOUS | 28 days supply | Qty: 1 | Fill #6

## 2021-12-24 MED FILL — ONDANSETRON 4 MG DISINTEGRATING TABLET: ORAL | 10 days supply | Qty: 30 | Fill #3

## 2021-12-26 NOTE — Unmapped (Signed)
Faxed provider order to Jeannene Patella at [facility]. [phone]  Fax confirmation received.

## 2022-01-03 NOTE — Unmapped (Signed)
Faxed Provider's order to Jordan Likes at [employer] [phone].  Fax confirmation received.

## 2022-01-15 DIAGNOSIS — R110 Nausea: Principal | ICD-10-CM

## 2022-01-15 DIAGNOSIS — J479 Bronchiectasis, uncomplicated: Principal | ICD-10-CM

## 2022-01-15 MED ORDER — NUCALA 100 MG/ML SUBCUTANEOUS AUTO-INJECTOR
SUBCUTANEOUS | 6 refills | 28 days
Start: 2022-01-15 — End: ?

## 2022-01-15 MED ORDER — ONDANSETRON 4 MG DISINTEGRATING TABLET
ORAL_TABLET | Freq: Three times a day (TID) | ORAL | 3 refills | 10 days | PRN
Start: 2022-01-15 — End: ?

## 2022-01-15 MED ORDER — SODIUM CHLORIDE 7 % FOR NEBULIZATION
Freq: Two times a day (BID) | RESPIRATORY_TRACT | 6 refills | 30 days
Start: 2022-01-15 — End: ?

## 2022-01-15 NOTE — Unmapped (Signed)
[facility] Refill Coordination Note    Specialty Medication(s) to be Shipped:   CF/Pulmonary: -[name]    Other medication(s) to be shipped: zofran, sodium chloride, spiriva, symbicort, ventolin, xarelto     Delmer Islam, DOB: 08-03-1956  Phone: There are no phone numbers on file.      All above HIPAA information was verified with patient's family member, wife.     Was a Nurse, learning disability used for this call? No    Completed refill call assessment today to schedule patient's medication shipment from the [facility] [phone]).  All relevant notes have been reviewed.     Specialty medication(s) and dose(s) confirmed: Regimen is correct and unchanged.   Changes to medications: Kaelum reports no changes at this time.  Changes to insurance: No  New side effects reported not previously addressed with a pharmacist or physician: None reported  Questions for the pharmacist: No    Confirmed patient received a Conservation officer, historic buildings and a Surveyor, mining with first shipment. The patient will receive a drug information handout for each medication shipped and additional FDA Medication Guides as required.       DISEASE/MEDICATION-SPECIFIC INFORMATION        For patients on injectable medications: Patient currently has 0 doses left.  Next injection is scheduled for 4/26 (based on last note).    SPECIALTY MEDICATION ADHERENCE     Medication Adherence    Patient reported X missed doses in the last month: 0  Specialty Medication: [name] 100mg /ml Inj  Patient is on additional specialty medications: No  Patient is on more than two specialty medications: No  Any gaps in refill history greater than 2 weeks in the last 3 months: no  Demonstrates understanding of importance of adherence: yes  Informant: spouse  Reliability of informant: reliable  Reasons for non-adherence: no problems identified  Confirmed plan for next specialty medication refill: delivery by pharmacy  Refills needed for supportive medications: not needed              Were doses missed due to medication being on hold? No    [name] 100mg /ml: Patient has 0 days of medication on hand    REFERRAL TO PHARMACIST     Referral to the pharmacist: Not needed      [facility]     Shipping address confirmed in Epic.     Delivery Scheduled: Yes, Expected medication delivery date: 4/14.     Medication will be delivered via UPS to the prescription address in Epic WAM.    Olga Millers   [facility] Specialty Technician

## 2022-01-15 NOTE — Unmapped (Signed)
Pharmacy requesting refill.  Last appointment on 11/26/2021.  Next appointment on 03/11/2022.  Requested Prescriptions     Pending Prescriptions Disp Refills   ??? sodium chloride 7% (PULMOSAL) 7 % Nebu 240 mL 6     Sig: Inhale 4 mL by nebulization two (2) times a day.   ??? mepolizumab ([name]) 100 mg/mL [name] 1 mL 6     Sig: Inject 100 mg under the skin every twenty-eight (28) days.   ??? ondansetron (ZOFRAN-ODT) 4 MG disintegrating tablet 30 tablet 3     Sig: Take 1 tablet (4 mg total) by mouth every eight (8) hours as needed for nausea.     RX routed to Dr. Marlana Salvage for review.

## 2022-01-16 DIAGNOSIS — J479 Bronchiectasis, uncomplicated: Principal | ICD-10-CM

## 2022-01-16 MED ORDER — ONDANSETRON 4 MG DISINTEGRATING TABLET
ORAL_TABLET | Freq: Three times a day (TID) | ORAL | 3 refills | 10 days | Status: CP | PRN
Start: 2022-01-16 — End: ?
  Filled 2022-01-17: qty 30, 10d supply, fill #0

## 2022-01-16 MED ORDER — SODIUM CHLORIDE 7 % FOR NEBULIZATION
Freq: Two times a day (BID) | RESPIRATORY_TRACT | 6 refills | 30 days | Status: CP
Start: 2022-01-16 — End: ?
  Filled 2022-01-17: qty 240, 30d supply, fill #0

## 2022-01-16 MED ORDER — NUCALA 100 MG/ML SUBCUTANEOUS AUTO-INJECTOR
SUBCUTANEOUS | 6 refills | 28 days | Status: CP
Start: 2022-01-16 — End: ?
  Filled 2022-01-17: qty 1, 28d supply, fill #0

## 2022-01-17 MED FILL — XARELTO 20 MG TABLET: ORAL | 90 days supply | Qty: 90 | Fill #1

## 2022-01-17 MED FILL — VENTOLIN HFA 90 MCG/ACTUATION AEROSOL INHALER: RESPIRATORY_TRACT | 17 days supply | Qty: 18 | Fill #5

## 2022-01-17 MED FILL — SYMBICORT 160 MCG-4.5 MCG/ACTUATION HFA AEROSOL INHALER: RESPIRATORY_TRACT | 30 days supply | Qty: 10.2 | Fill #5

## 2022-01-17 MED FILL — SPIRIVA RESPIMAT 2.5 MCG/ACTUATION SOLUTION FOR INHALATION: RESPIRATORY_TRACT | 30 days supply | Qty: 4 | Fill #2

## 2022-01-30 MED FILL — PREDNISONE 10 MG TABLET: ORAL | 30 days supply | Qty: 30 | Fill #1

## 2022-01-30 MED FILL — CITALOPRAM 20 MG TABLET: ORAL | 90 days supply | Qty: 90 | Fill #1

## 2022-01-30 MED FILL — PANTOPRAZOLE 40 MG TABLET,DELAYED RELEASE: ORAL | 30 days supply | Qty: 60 | Fill #1

## 2022-02-01 MED ORDER — CITALOPRAM 20 MG TABLET
ORAL_TABLET | Freq: Every day | ORAL | 1 refills | 90 days
Start: 2022-02-01 — End: 2022-07-31

## 2022-02-01 MED ORDER — XARELTO 20 MG TABLET
ORAL_TABLET | Freq: Every day | ORAL | 1 refills | 90 days
Start: 2022-02-01 — End: 2022-07-31

## 2022-02-03 MED ORDER — CITALOPRAM 20 MG TABLET
ORAL_TABLET | Freq: Every day | ORAL | 1 refills | 90 days | Status: CP
Start: 2022-02-03 — End: 2022-08-02

## 2022-02-03 MED ORDER — RIVAROXABAN 20 MG TABLET
ORAL_TABLET | Freq: Every day | ORAL | 1 refills | 90 days | Status: CP
Start: 2022-02-03 — End: 2022-08-02
  Filled 2022-04-26: qty 90, 90d supply, fill #0

## 2022-02-15 NOTE — Unmapped (Signed)
Called patient to inform them of calling [facility] to get refill of [name].  Spoke to patient's wife.  Informed wife that patient is needing to call [facility] pharmacy in order to get medication.  Wife verbalized understanding.

## 2022-02-15 NOTE — Unmapped (Addendum)
[facility] Refill Coordination Note    Specialty Medication(s) to be Shipped:   CF/Pulmonary/Asthma: [name]    Other medication(s) to be shipped: ondansetron  spiriva  symbicort  Ventolin       John Crawford, DOB: [birth year]  Phone: There are no phone numbers on file.      All above HIPAA information was verified with patient's family member, jill.     Was a Nurse, learning disability used for this call? No    Completed refill call assessment today to schedule patient's medication shipment from the [facility] [phone]).  All relevant notes have been reviewed.     Specialty medication(s) and dose(s) confirmed: Regimen is correct and unchanged.   Changes to medications: John Crawford reports no changes at this time.  Changes to insurance: No  New side effects reported not previously addressed with a pharmacist or physician: None reported  Questions for the pharmacist: No    Confirmed patient received a Conservation officer, historic buildings and a Surveyor, mining with first shipment. The patient will receive a drug information handout for each medication shipped and additional FDA Medication Guides as required.       DISEASE/MEDICATION-SPECIFIC INFORMATION        For patients on injectable medications: Patient currently has 0 doses left.  Next injection is scheduled for 5/24.    SPECIALTY MEDICATION ADHERENCE     Medication Adherence    Patient reported X missed doses in the last month: 0  Specialty Medication: [name] 100mg /ml Inj  Informant: patient  Reliability of informant: reliable  Reasons for non-adherence: no problems identified              Were doses missed due to medication being on hold? No        REFERRAL TO PHARMACIST     Referral to the pharmacist: Not needed      [facility]     Shipping address confirmed in Epic.     Delivery Scheduled: Yes, Expected medication delivery date: 5/17.     Medication will be delivered via UPS to the prescription address in Epic WAM.    Westley Gambles   [name] Specialty Technician

## 2022-02-15 NOTE — Unmapped (Signed)
The [facility] has made a third and final attempt to reach this patient to refill the following medication: [name] 100mg /ml.      We have left voicemails on the following phone numbers: [phone] and have left voicemail with Wife, [name] at the following phone numbers: [phone].    Dates contacted: 04/28, 05/08 & 05/12    Last scheduled delivery: 01/17/2022    The patient may be at risk of non-compliance with this medication. The patient should call the [facility] at [phone]  Option 4, then Option 2 (all other specialty patients) to refill medication.    Keeya Dyckman Leodis Binet   [facility] Specialty Technician

## 2022-02-19 MED FILL — SYMBICORT 160 MCG-4.5 MCG/ACTUATION HFA AEROSOL INHALER: RESPIRATORY_TRACT | 30 days supply | Qty: 10.2 | Fill #6

## 2022-02-19 MED FILL — SPIRIVA RESPIMAT 2.5 MCG/ACTUATION SOLUTION FOR INHALATION: RESPIRATORY_TRACT | 30 days supply | Qty: 4 | Fill #3

## 2022-02-19 MED FILL — ONDANSETRON 4 MG DISINTEGRATING TABLET: ORAL | 10 days supply | Qty: 30 | Fill #1

## 2022-02-19 MED FILL — VENTOLIN HFA 90 MCG/ACTUATION AEROSOL INHALER: RESPIRATORY_TRACT | 17 days supply | Qty: 18 | Fill #6

## 2022-02-19 MED FILL — NUCALA 100 MG/ML SUBCUTANEOUS AUTO-INJECTOR: SUBCUTANEOUS | 28 days supply | Qty: 1 | Fill #1

## 2022-03-06 MED ORDER — BUDESONIDE-FORMOTEROL HFA 160 MCG-4.5 MCG/ACTUATION AEROSOL INHALER
Freq: Two times a day (BID) | RESPIRATORY_TRACT | 6 refills | 31 days | Status: CP
Start: 2022-03-06 — End: ?

## 2022-03-06 NOTE — Unmapped (Signed)
Pharmacy requesting refill.  Last appointment 11/26/2021.  Next appointment 03/11/2022.  Requested Prescriptions     Pending Prescriptions Disp Refills   ??? budesonide-formoteroL (SYMBICORT) 160-4.5 mcg/actuation inhaler 10.2 g 6     Sig: Inhale 2 puffs by mouth Two (2) times a day.     RX routed to Dr. Marlana Salvage for review.

## 2022-03-06 NOTE — Unmapped (Signed)
[facility] Pharmacy Refill Coordination Note    Specialty Medication(s) to be Shipped:   CF/Pulmonary/Asthma: [name] 100mg /ml    Other medication(s) to be shipped: azithromycin  symbicort  Metoprolol  Ondansetron  Ventolin       John Crawford, DOB: [birth year]  Phone: There are no phone numbers on file.      All above HIPAA information was verified with patient's family member, wife.     Was a Nurse, learning disability used for this call? No    Completed refill call assessment today to schedule patient's medication shipment from the [facility] [phone]).  All relevant notes have been reviewed.     Specialty medication(s) and dose(s) confirmed: Regimen is correct and unchanged.   Changes to medications: John Crawford reports no changes at this time.  Changes to insurance: No  New side effects reported not previously addressed with a pharmacist or physician: None reported  Questions for the pharmacist: No    Confirmed patient received a Conservation officer, historic buildings and a Surveyor, mining with first shipment. The patient will receive a drug information handout for each medication shipped and additional FDA Medication Guides as required.       DISEASE/MEDICATION-SPECIFIC INFORMATION        For patients on injectable medications: Patient currently has 0 doses left.  Next injection is scheduled for 6/14.    SPECIALTY MEDICATION ADHERENCE     Medication Adherence    Patient reported X missed doses in the last month: 0  Specialty Medication: nucala 100mg /ml              Were doses missed due to medication being on hold? No        REFERRAL TO PHARMACIST     Referral to the pharmacist: Not needed      [facility]     Shipping address confirmed in Epic.     Delivery Scheduled: Yes, Expected medication delivery date: 6/7.     Medication will be delivered via UPS to the prescription address in Epic WAM.    John Crawford   [facility] Specialty Technician

## 2022-03-08 MED FILL — PANTOPRAZOLE 40 MG TABLET,DELAYED RELEASE: ORAL | 30 days supply | Qty: 60 | Fill #2

## 2022-03-08 NOTE — Unmapped (Signed)
Pulmonary Clinic - Initial Visit    Referring Physician :  Legacy Conversion Provi*  PCP:     Viona Gilmore, MD  Reason for Consult:   COPD     HISTORY:     History of Present Illness:  Initial Hx   John Crawford is a 66 y.o. male with a history of A fib/flutter, hx of HFrEF, and COPD whom we are seeing in consultation requested by Legacy Conversion Provi* for evaluation of COPD.  Pt was diagnosed 3 years ago when he had increased SOA and recurrent infections. He was hospitalized most recently in 03/2020 for exacerbation and has been hospitalized 3 times in the last year for COPD exacerbations. He has been treated numerous times as an OP. Pt notes that in the am he is extremely fatigued. He coughs up a significant amount of yellow/green sputum in the am. He has persistent cough throughout the day. His SOA has limited his acitivey for several years.  He is on 2L O2 at night which was prescribed after one of his hospitalizations in the past. He normally feels well for several days after getting prednisone and abx but then returns to feeling poorly.     Albuterol inhaler 10x a day, albuterol nebs 2x daily, Symbicort, Spiriva. Had been on advair in past.   Patient smokes 1 cigarette a day. Patient has smoked since 65 years old. He smoked 2-3 packs a day for several years. He stopped for 3 months at one point in time.     Interval History   Since last being seen in clinic patient has had multiple presentations to the ED for shortness of breath or calls to his PCP regarding shortness of breath. For this reason, John Crawford recently underwent consultation with palliative care. He does want to avoid repeated hospitalization but also reports wanting to be able to present to the ED if he is having a flare givent that it may be triggered by something treatable like a viral infection or PNA. He states that he has more good days than bad and that overall he still spends a significant amount of his time doing activities he enjoys and spending time with his wife. He tells me he is not ready for hospice. He states that if his sx are to get worse and he is having more bad days than good or his sx make him unable to enjoy his quality of life, he will reach out to palliative care and I . Since last seeing him, I had increased his symbicort, started him on prednisone daily and restarted azithromcyin. With this regimen John Crawford feels much better. He is in favor of therapies that may help his symptoms but understands that these therapies are not necessarily life prolonging. He continues to wear oxygen at night.     Interval Hx 01/19/21  John Crawford has been having a headache daily for about 2-3 weeks. He has taken a tylenol or aleve that has provided relief. He noticed that it started after he received his Mepolizumab injection. While his breathing is still poor, it is overall improved from 2-3 months ago and he has not had to present to the hospital for shortness of breath. He continues to use his albuterol nebulizer frequently. He continues to work in his shop and enjoy his hobbies. His cough is stable, still not getting much sputum up with airway clearance which he does daily.     Interval Hx 06/04/21  John Crawford reports O2 sat  94-95% prior to going to bed. However when he wakes up O2 has been 3-84% prior to getting out of bed. He is wearing 2.5L at night. He has a headache and feels very lethargic when his O2 is this low.     For 2-3 days after getting the nucala injections he has a HA and feels nauseous. He then feels back to normal. He takes zofran daily for reflux sx.     He otherwise has stable shortness of breath and cough. His cough has been dry and he is continuing his airway clearance.     Interval Hx 08/13/21  John Crawford has been feeling poorly for the last 2 weeks. He is suffering from shortness of breath. He denies increased sputum production or volume. No F/C. No recent sick contacts. He did have one episode of chest pain while working that lasted for 5-6 minutes and self resolved. He has also been out of his rescue inhaler. He also complains of a band like headache. He intermittently wears his O2 at home and when not wearing will have O2 70-80s. At night he always wears his O2 and is still in the process of getting a CPAP titration. Denies any increased reflux sx, allergic sx.    Interval Hx 11/26/21  John Crawford reports that he has been more tired than usual to the point where he has been in bed for 3 weeks. He denies changes in his breathing, cough, changes in phlegm. He has been wearing his oxygen more often although he is not wearing it out of the house. He also complains of a headache that is located in his temples but also has pain over his sinuses. Of all of his concerns his fatigue is the most prominent.     Interval Hx 03/11/2022  John Crawford has been feeling the best he has in about a year. His O2 has been about 92% at home off O2. His cough is controlled. No worsening shortness of breath. No HA/sinus pressure. Has his O2 with him today but is not wearing it with SpO2 91%.     Past Medi/cal History:  Past Medical History:   Diagnosis Date    Atrial flutter (CMS-HCC)     Bronchiectasis without complication (CMS-HCC) 08/17/2020    COPD (chronic obstructive pulmonary disease) (CMS-HCC)     Tobacco abuse      Past Surgical History:   Procedure Laterality Date    PR COMPRE EP EVAL ABLTJ 3D MAPG TX SVT N/A 12/15/2019    Procedure: A-Flutter Ablation;  Surgeon: Webb Silversmith, MD;  Location: [facility] EP;  Service: Cardiology       Other History:  The social history and family history were personally reviewed and updated in the patient's electronic medical record.    Family History   Problem Relation Age of Onset    Colon cancer Mother     Heart attack Father     Prostate cancer Father      Social History     Socioeconomic History    Marital status: Married   Occupational History    Occupation: Curator    Tobacco Use    Smoking status: Every Day     Packs/day: 0.15     Types: Cigarettes    Smokeless tobacco: Never    Tobacco comments:     started smoking at 17, 2ppd for 35 years   Vaping Use    Vaping Use: Never used   Substance and Sexual Activity  Alcohol use: Yes     Comment: 1 to 2 cans of beer daily    Drug use: Never    Sexual activity: Yes       Home Medications:  Current Outpatient Medications on File Prior to Visit   Medication Sig Dispense Refill    albuterol 2.5 mg /3 mL (0.083 %) nebulizer solution Inhale 3 mL (2.5 mg total) by nebulization Every four (4) hours. 540 mL 11    albuterol HFA 90 mcg/actuation inhaler Inhale 2 puffs every four (4) hours as needed for wheezing. 18 g 11    azithromycin (ZITHROMAX) 250 MG tablet Take 1 tablet (250 mg total) by mouth daily. 90 tablet 3    budesonide-formoteroL (SYMBICORT) 160-4.5 mcg/actuation inhaler Inhale 2 puffs by mouth Two (2) times a day. 10.2 g 6    citalopram (CELEXA) 20 MG tablet Take 1 tablet (20 mg total) by mouth daily. 90 tablet 1    EPINEPHrine (EPIPEN) 0.3 mg/0.3 mL injection Inject 0.3 mL (0.3 mg total) into the muscle once for 1 dose. For emergency use with [name] 2 each 1    mepolizumab ([name]) 100 mg/mL [name] Inject 100 mg under the skin every twenty-eight (28) days. 1 mL 6    metoprolol succinate (TOPROL-XL) 50 MG 24 hr tablet Take 1 tablet (50 mg total) by mouth daily. 90 tablet 2    ondansetron (ZOFRAN-ODT) 4 MG disintegrating tablet Dissolve 1 tablet (4 mg total) on the tongue every eight (8) hours as needed for nausea. 30 tablet 3    pantoprazole (PROTONIX) 40 MG tablet Take 1 tablet (40 mg total) by mouth Two (2) times a day. 60 tablet 11    predniSONE (DELTASONE) 10 MG tablet Take 1 tablet (10 mg total) by mouth daily. 30 tablet 11    rivaroxaban (XARELTO) 20 mg tablet Take 1 tablet (20 mg total) by mouth daily with evening meal. 90 tablet 1    sodium chloride (SALINE NASAL) 0.65 % nasal spray 1 spray into each nostril Two (2) times a day. 36 mL 3    sodium chloride 7% (PULMOSAL) 7 % Nebu Inhale 4 mL by nebulization two (2) times a day. 240 mL 6    tiotropium bromide (SPIRIVA RESPIMAT) 2.5 mcg/actuation inhalation mist Inhale 2 puffs by mouth daily. 4 g 11     No current facility-administered medications on file prior to visit.       Allergies:  Allergies as of 03/11/2022    (No Known Allergies)       Review of Systems:  A comprehensive review of systems was completed and negative except as noted in HPI.    PHYSICAL EXAM:     Vitals:    03/11/22 1454   BP: 109/65   Pulse: 70   Temp: 36.8 ??C (98.2 ??F)   SpO2: 91%    11/2021: Placed on 2L oxygen with improvement to 95%. Desat to 83% on room air at 11/2021 appt.     General: Alert and oriented, no acute distress  HEENT: MMM, clear oropharynx  CV: RRR, no m/r/g  Lungs: non-labored breathing, wet cough   Abd: ND  Ext: Warm, well perfused, no peripheral edema  Skin: No rashes    LABORATORY and RADIOLOGY DATA:     Pulmonary Function Tests/Interpretation:      6 minute walk with 2L O2 requirement 01/22/21, previously without O2 need.     STOP BANG 5     Pertinent Laboratory Data:  WBC 8.5  IgG  subclasses and IgA, IgM, IgE WNL    Pertinent Imaging Data:  CT 08/16/20  IMPRESSION:     1. Pulmonary nodules measuring up to 0.6 cm (may be impacted airways).      Lung-RADS Category:3S      Follow-up Recommendation:Follow-up chest CT in 6 month.        2. Mild diffuse bronchiectasis and bronchial wall thickening, most prominent in the upper lobes. Etiology is indeterminate but given upper lobe and central predominance, ABPA should be in differential.     ASSESSMENT and PLAN     John Crawford is a 66 y.o. male with a history of A fib/flutter, hx of HFrEF,  COPD, and bronchectasis whom we are seeing in consultation requested by Terri Piedra for evaluation of COPD. However, pt with screening CT that shows a new diagnosis of bronchiectasis as well 08/2020. In early 2022 John Crawford has had repeated exacerbations prompting palliative care consult by PCP. However, after much discussion, John Crawford feels that he has more good days than bad and has a reasonable quality of life on his current medication regimen. He understands that the therapies he's on will not prolong his life but is open to trying therapies that may help with his sx.     Pt initially presumed to have COPD with > 3 exacerbation in the last year requiring hospitalization while on triple therapy and multiple more in the OP setting. Teaching on inhaler use has been performed in clinic and patient was started on azithromycin at initial apt due to exacerbations. (Roflumilast is likely cost prohibitive for him). Azithro was briefly paused due to concern for MAC and desire to avoid resistance but AFB culture NEG so restarted given frequent exacerbations. Given his end stage disease and significant improvement with prednsione, I have started John Crawford on prednisone 10mg  daily. He additionally remains on triple therapy. Pt with elevated EOS and improvement with bronchodilator although <282ml improvement in FEV1. For this reason, he has been started on mepolizumab which he feels has improved his sx despite having some HA and nausea in the 2-3 days following his injection.     Bronchiectasis noted on screening CT 11/10. This was a new dx for him and while he was hospitalized for a COPD exacerbation an [place] was provided to the patient and workup for etiology of bronchiectasis was initiated. So far, WBC and IgG levels WNL. Alpha one MM (WNL). Sputum cx, fungal, and AFB cx have been negative. Suspect that many of patients COPD flares have actually been 2/2 his bronchiectasis.     COPD   - Last treated for flare with a slow taper in 11/22, treated for sinus infection with Augmentin 11/2021  - Continue on 10mg  prednisone daily   - Continue on Symbicort BID, Spiriva, albuterol PRN    - Azithromycin daily   - Has albuterol nebs at home to use PRN   - Nursing education on using a spacer at initial apt   - Continue Mepolizumab   - Nausea and HA with injection: Recc zofran PRN and tylenol   - If sx continue, will consider d/c in the future or transitioning to an alternative agent   - Ordered pulmonary rehab, unable to complete due to location. Provided with online resource.   - with 2L [place] supplemental oxygen requirement 01/22/21. Desat on RA 11/2021.   - [employer] O2  - ABG without hypercarbia but does suggest hypoxia   -FVL at next appt  Bronchiectasis   - Upper lobe predominant but diffuse bronchiectasis, no prior imaging for comparison   - IgG subclasses and IgA, IgM, IgE WNL  - ABPA panel with IgE 79pt   - Alpha 1 WNL  - Sputum culture, fungal, and AFB 12/21 and 1/22 NEG   -  Aerobika BID with albuterol & HTS 7%  - Continues to have significant reflux sx, due to patient having severe lung disease prefer to avoid invasive procedures. For this reason, will increase PPI to BID. If continues to have significant sx, will refer to GI. Advised to stop taking Zofran for reflux sx.     Tobacco Cessation   - Pt not interested in cessation   - Lung Ca CT 9/21 with 6mm nodule Lung-RADS 3S, had previously held off on repeat imaging due to limited life-expectancy with repeated ED visits and referral to palliative. However, he is currently having more good days than bad and is overall in a better place on his current regimen will resume screening as patient is willing to consider workup and therapy based on findings.   - LDCT ordered     Daytime Fatigue    - STOP BANG 5  - Sleep study with OSA, at that time 1L O2 requirement at night   - Pt using 2.5L at night with desaturations when he wakes up, asked to increase to 4L  - Patient is to call and schedule CPAP titration study, will FU O2 requirement with this study as well     Influenza: 08/13/21  Prenvar:  08/28/20. PCV 2/20  Lung cancer screening: See above   Covid-19: 05/24/2020, 06/19/20, 07/19/2020, consider bivalent at next appt     Immunization History   Administered Date(s) Administered    COVID-19 VACCINE,MRNA(MODERNA)(PF) 05/24/2020, 06/19/2020, 07/19/2020    Influenza Vaccine Quad (IIV4 PF) 86mo+ injectable 12/16/2019, 06/19/2020, 08/13/2021    Pneumococcal Conjugate 13-Valent 08/28/2020    Pneumococcal Conjugate 20-valent 11/26/2021    TD(TDVAX),ADSORBED,2LF(IM)(PF) 03/27/2010    TdaP 03/23/2018       Patient will return to clinic in 3 months or sooner if needed.    This patient was seen and discussed with attending physician, Dr. Nada Libman who agrees with the assessment and plan above.     ZO:XWRUEA Conversion Provi*, Viona Gilmore, MD    Esther Hardy

## 2022-03-11 ENCOUNTER — Ambulatory Visit
Admit: 2022-03-11 | Discharge: 2022-03-12 | Payer: MEDICAID | Attending: Student in an Organized Health Care Education/Training Program | Primary: Student in an Organized Health Care Education/Training Program

## 2022-03-11 DIAGNOSIS — D721 Eosinophilia, unspecified type: Principal | ICD-10-CM

## 2022-03-11 DIAGNOSIS — J441 Chronic obstructive pulmonary disease with (acute) exacerbation: Principal | ICD-10-CM

## 2022-03-11 DIAGNOSIS — J479 Bronchiectasis, uncomplicated: Principal | ICD-10-CM

## 2022-03-11 NOTE — Unmapped (Signed)
I saw and evaluated the patient, participating in the key portions of the service.  I reviewed the resident???s note.  I agree with the resident???s findings and plan. Pablo Ledger, MD

## 2022-03-11 NOTE — Unmapped (Signed)
Thank you for allowing me to be a part of your care. Please call the clinic with any questions.    Brees Hounshell, MD     Between appointments, you can reach us at these numbers:    For appointments or the Pulmonary Nurse: [phone], Fax: [phone]  For emergency issues after hours: Hospital Operator: [phone], ask for Pulmonary Fellow on call  [facility]: [phone]  Pharmacy Assistance Program: [phone]    Important Links:  How to use inhaler videos: COPD Inhaler Educational Video Series - [employer] Foundation   Pulmonary rehab: Pulmonary Rehabilitation - Department of Medicine ([email or web address])   Smoking cessation: Smoking Cessation - Department of Medicine [email or web address])

## 2022-03-12 DIAGNOSIS — J479 Bronchiectasis, uncomplicated: Principal | ICD-10-CM

## 2022-03-13 MED FILL — NUCALA 100 MG/ML SUBCUTANEOUS AUTO-INJECTOR: SUBCUTANEOUS | 28 days supply | Qty: 1 | Fill #2

## 2022-03-13 MED FILL — SYMBICORT 160 MCG-4.5 MCG/ACTUATION HFA AEROSOL INHALER: RESPIRATORY_TRACT | 30 days supply | Qty: 10.2 | Fill #0

## 2022-03-13 MED FILL — VENTOLIN HFA 90 MCG/ACTUATION AEROSOL INHALER: RESPIRATORY_TRACT | 17 days supply | Qty: 18 | Fill #7

## 2022-03-13 MED FILL — ONDANSETRON 4 MG DISINTEGRATING TABLET: ORAL | 10 days supply | Qty: 30 | Fill #2

## 2022-03-13 MED FILL — METOPROLOL SUCCINATE ER 50 MG TABLET,EXTENDED RELEASE 24 HR: ORAL | 90 days supply | Qty: 90 | Fill #1

## 2022-03-13 MED FILL — AZITHROMYCIN 250 MG TABLET: ORAL | 30 days supply | Qty: 30 | Fill #1

## 2022-03-13 NOTE — Unmapped (Signed)
John Crawford 's [name] shipment will be delayed as a result of prior authorization being required by the patient's insurance.     I have reached out to the patient  at [phone] and communicated the delivery change. We will reschedule the medication for the delivery date that the patient agreed upon.  We have confirmed the delivery date as 6/8, via ups.

## 2022-03-19 ENCOUNTER — Ambulatory Visit: Admit: 2022-03-19 | Discharge: 2022-03-20 | Payer: MEDICAID

## 2022-04-02 NOTE — Unmapped (Signed)
[facility] Clinical Assessment & Refill Coordination Note    John Crawford, [place]: [birth year]  Phone: There are no phone numbers on file.    All above HIPAA information was verified with patient's family member, spouse John Crawford).     Was a Nurse, learning disability used for this call? No    Specialty Medication(s):   CF/Pulmonary/Asthma: [name] 100 mg/mL     Current Outpatient Medications   Medication Sig Dispense Refill    albuterol 2.5 mg /3 mL (0.083 %) nebulizer solution Inhale 3 mL (2.5 mg total) by nebulization Every four (4) hours. 540 mL 11    albuterol HFA 90 mcg/actuation inhaler Inhale 2 puffs every four (4) hours as needed for wheezing. 18 g 11    azithromycin (ZITHROMAX) 250 MG tablet Take 1 tablet (250 mg total) by mouth daily. 90 tablet 3    budesonide-formoteroL (SYMBICORT) 160-4.5 mcg/actuation inhaler Inhale 2 puffs by mouth Two (2) times a day. 10.2 g 6    citalopram (CELEXA) 20 MG tablet Take 1 tablet (20 mg total) by mouth daily. 90 tablet 1    EPINEPHrine (EPIPEN) 0.3 mg/0.3 mL injection Inject 0.3 mL (0.3 mg total) into the muscle once for 1 dose. For emergency use with [name] 2 each 1    mepolizumab ([name]) 100 mg/mL [name] Inject 100 mg under the skin every twenty-eight (28) days. 1 mL 6    metoprolol succinate (TOPROL-XL) 50 MG 24 hr tablet Take 1 tablet (50 mg total) by mouth daily. 90 tablet 2    ondansetron (ZOFRAN-ODT) 4 MG disintegrating tablet Dissolve 1 tablet (4 mg total) on the tongue every eight (8) hours as needed for nausea. 30 tablet 3    pantoprazole (PROTONIX) 40 MG tablet Take 1 tablet (40 mg total) by mouth Two (2) times a day. 60 tablet 11    predniSONE (DELTASONE) 10 MG tablet Take 1 tablet (10 mg total) by mouth daily. 30 tablet 11    rivaroxaban (XARELTO) 20 mg tablet Take 1 tablet (20 mg total) by mouth daily with evening meal. 90 tablet 1    sodium chloride (SALINE NASAL) 0.65 % nasal spray 1 spray into each nostril Two (2) times a day. 36 mL 3 sodium chloride 7% (PULMOSAL) 7 % Nebu Inhale 4 mL by nebulization two (2) times a day. 240 mL 6    tiotropium bromide (SPIRIVA RESPIMAT) 2.5 mcg/actuation inhalation mist Inhale 2 puffs by mouth daily. 4 g 11     No current facility-administered medications for this visit.        Changes to medications: Raed reports no changes at this time.    No Known Allergies    Changes to allergies: No    SPECIALTY MEDICATION ADHERENCE     [name] 100 mg/ml: 0 days of medicine on hand     Medication Adherence    Patient reported X missed doses in the last month: 0  Specialty Medication: [name] 100 mg/mL  Patient is on additional specialty medications: No  Patient is on more than two specialty medications: No  Any gaps in refill history greater than 2 weeks in the last 3 months: no  Demonstrates understanding of importance of adherence: yes  Informant: spouse  Reliability of informant: reliable          Specialty medication(s) dose(s) confirmed: Regimen is correct and unchanged.     Are there any concerns with adherence? No    Adherence counseling provided? Not needed    CLINICAL  MANAGEMENT AND INTERVENTION      Clinical Benefit Assessment:    Do you feel the medicine is effective or helping your condition? Yes    Clinical Benefit counseling provided? Progress note from 6/5 shows evidence of clinical benefit    Adverse Effects Assessment:    Are you experiencing any side effects? No    Are you experiencing difficulty administering your medicine? No    Quality of Life Assessment:    Quality of Life    Rheumatology  Oncology  Dermatology  Cystic Fibrosis          How many days over the past month did your bronchiectasis  keep you from your normal activities? For example, brushing your teeth or getting up in the morning. Patient declined to answer    Have you discussed this with your provider? Not needed    Acute Infection Status:    Acute infections noted within Epic:  No active infections  Patient reported infection: None    Therapy Appropriateness:    Is therapy appropriate and patient progressing towards therapeutic goals? Yes, therapy is appropriate and should be continued    DISEASE/MEDICATION-SPECIFIC INFORMATION      For patients on injectable medications: Patient currently has 0 doses left.  Next injection is scheduled for 6/12.    PATIENT SPECIFIC NEEDS     Does the patient have any physical, cognitive, or cultural barriers? No    Is the patient high risk? No    Does the patient require a Care Management Plan? No     SOCIAL DETERMINANTS OF HEALTH     At the [facility], we have learned that life circumstances - like trouble affording food, housing, utilities, or transportation can affect the health of many of our patients.   That is why we wanted to ask: are you currently experiencing any life circumstances that are negatively impacting your health and/or quality of life? Patient declined to answer    Social Determinants of Health     Financial Resource Strain: Not on file   Internet Connectivity: Not on file   Food Insecurity: Not on file   Tobacco Use: High Risk    Smoking Tobacco Use: Every Day    Smokeless Tobacco Use: Never    Passive Exposure: Not on file   Housing/Utilities: Not on file   Alcohol Use: Not on file   Transportation Needs: Not on file   Substance Use: Not on file   Health Literacy: Not on file   Physical Activity: Not on file   Interpersonal Safety: Not on file   Stress: Not on file   Intimate Partner Violence: Not on file   Depression: Not on file   Social Connections: Not on file       Would you be willing to receive help with any of the needs that you have identified today? Not applicable       SHIPPING     Specialty Medication(s) to be Shipped:   CF/Pulmonary/Asthma: [name] 100 mg/mL    Other medication(s) to be shipped:  Spiriva Respimat, ventolin HFA inhaler, pantoprazole 40mg , symbicort 160-4.20mcg, azithromycin 250 mg, and ondansetron 4 mg     Changes to insurance: Yes: Will have new insurance starting July 1st. Will back with new insurance information once received.    Delivery Scheduled: Yes, Expected medication delivery date: 04/11/22.     Medication will be delivered via UPS to the confirmed prescription address in [facility].    The patient will  receive a drug information handout for each medication shipped and additional FDA Medication Guides as required.  Verified that patient has previously received a Conservation officer, historic buildings and a Surveyor, mining.    The patient or caregiver noted above participated in the development of this care plan and knows that they can request review of or adjustments to the care plan at any time.      All of the patient's questions and concerns have been addressed.    [name]   [facility] Specialty Pharmacist

## 2022-04-04 MED FILL — SPIRIVA RESPIMAT 2.5 MCG/ACTUATION SOLUTION FOR INHALATION: RESPIRATORY_TRACT | 30 days supply | Qty: 4 | Fill #4

## 2022-04-04 MED FILL — VENTOLIN HFA 90 MCG/ACTUATION AEROSOL INHALER: RESPIRATORY_TRACT | 17 days supply | Qty: 18 | Fill #8

## 2022-04-10 MED FILL — PANTOPRAZOLE 40 MG TABLET,DELAYED RELEASE: ORAL | 30 days supply | Qty: 60 | Fill #3

## 2022-04-10 MED FILL — AZITHROMYCIN 250 MG TABLET: ORAL | 30 days supply | Qty: 30 | Fill #2

## 2022-04-10 MED FILL — NUCALA 100 MG/ML SUBCUTANEOUS AUTO-INJECTOR: SUBCUTANEOUS | 28 days supply | Qty: 1 | Fill #3

## 2022-04-10 MED FILL — ONDANSETRON 4 MG DISINTEGRATING TABLET: ORAL | 10 days supply | Qty: 30 | Fill #3

## 2022-04-10 MED FILL — SYMBICORT 160 MCG-4.5 MCG/ACTUATION HFA AEROSOL INHALER: RESPIRATORY_TRACT | 30 days supply | Qty: 10.2 | Fill #1

## 2022-04-25 MED ORDER — EPINEPHRINE 0.3 MG/0.3 ML INJECTION, AUTO-INJECTOR
Freq: Once | INTRAMUSCULAR | 1 refills | 2 days
Start: 2022-04-25 — End: 2022-04-25

## 2022-04-26 MED ORDER — EPINEPHRINE 0.3 MG/0.3 ML INJECTION, AUTO-INJECTOR
Freq: Every day | INTRAMUSCULAR | 1 refills | 0 days | PRN
Start: 2022-04-26 — End: ?

## 2022-04-26 MED FILL — VENTOLIN HFA 90 MCG/ACTUATION AEROSOL INHALER: RESPIRATORY_TRACT | 17 days supply | Qty: 18 | Fill #9

## 2022-04-26 NOTE — Unmapped (Signed)
Pharmacy requesting refill.  Last appointment on 03/11/22.  Next appointment not yet scheduled.  Requested Prescriptions     Pending Prescriptions Disp Refills   ??? EPINEPHrine (EPIPEN) 0.3 mg/0.3 mL injection 2 each 1     Sig: Inject 0.3 mL (0.3 mg total) into the muscle as needed for anaphylaxis. For emergency use with [name]     RX routed to provider for review.

## 2022-04-28 MED ORDER — EPINEPHRINE 0.3 MG/0.3 ML INJECTION, AUTO-INJECTOR
Freq: Every day | INTRAMUSCULAR | 1 refills | 0 days | Status: CP | PRN
Start: 2022-04-28 — End: ?
  Filled 2022-04-30: qty 2, 2d supply, fill #0

## 2022-05-02 NOTE — Unmapped (Signed)
[facility] Specialty Pharmacy Refill Coordination Note    Specialty Medication(s) to be Shipped:   CF/Pulmonary/Asthma: [name] 100mg /ml    Other medication(s) to be shipped:  citalopram 20mg , Deep Sea Nasal 0.65%, pantoprazole 40mg , Spiriva Respimat 2.11mcg, Symbicort 160-4.77mcg inhaler     John Crawford, DOB: 1955-11-25  Phone: There are no phone numbers on file.      All above HIPAA information was verified with patient's family member, wife.     Was a Nurse, learning disability used for this call? No    Completed refill call assessment today to schedule patient's medication shipment from the [facility] [phone]).  All relevant notes have been reviewed.     Specialty medication(s) and dose(s) confirmed: Regimen is correct and unchanged.   Changes to medications: Rawn reports no changes at this time.  Changes to insurance: No  New side effects reported not previously addressed with a pharmacist or physician: None reported  Questions for the pharmacist: No    Confirmed patient received a Conservation officer, historic buildings and a Surveyor, mining with first shipment. The patient will receive a drug information handout for each medication shipped and additional FDA Medication Guides as required.       DISEASE/MEDICATION-SPECIFIC INFORMATION        For patients on injectable medications: Patient currently has 0 doses left.  Next injection is scheduled for 05/16/22.    SPECIALTY MEDICATION ADHERENCE     Medication Adherence    Patient reported X missed doses in the last month: 0  Specialty Medication: [name] 100 mg/mL [name]  Patient is on additional specialty medications: No  Patient is on more than two specialty medications: No  Informant: spouse  Reliability of informant: reliable  Provider-estimated medication adherence level: good  Reasons for non-adherence: no problems identified              Were doses missed due to medication being on hold? No    [name] 100 mg/mL [name]  : 0 days of medicine on hand        REFERRAL TO PHARMACIST     Referral to the pharmacist: Not needed      [facility]     Shipping address confirmed in Epic.     Delivery Scheduled: Yes, Expected medication delivery date: 05/08/22.     Medication will be delivered via UPS to the prescription address in Epic WAM.    Nainika Newlun' W Wilhemena Durie Shared [facility] Specialty Technician

## 2022-05-07 MED FILL — SPIRIVA RESPIMAT 2.5 MCG/ACTUATION SOLUTION FOR INHALATION: RESPIRATORY_TRACT | 30 days supply | Qty: 4 | Fill #5

## 2022-05-07 MED FILL — PANTOPRAZOLE 40 MG TABLET,DELAYED RELEASE: ORAL | 30 days supply | Qty: 60 | Fill #4

## 2022-05-07 MED FILL — NUCALA 100 MG/ML SUBCUTANEOUS AUTO-INJECTOR: SUBCUTANEOUS | 28 days supply | Qty: 1 | Fill #4

## 2022-05-07 MED FILL — CITALOPRAM 20 MG TABLET: ORAL | 30 days supply | Qty: 30 | Fill #0

## 2022-05-07 MED FILL — DEEP SEA NASAL 0.65 % SPRAY AEROSOL: NASAL | 110 days supply | Qty: 44 | Fill #0

## 2022-05-08 MED FILL — SYMBICORT 160 MCG-4.5 MCG/ACTUATION HFA AEROSOL INHALER: RESPIRATORY_TRACT | 30 days supply | Qty: 10.2 | Fill #2

## 2022-05-14 DIAGNOSIS — J479 Bronchiectasis, uncomplicated: Principal | ICD-10-CM

## 2022-05-18 DIAGNOSIS — R110 Nausea: Principal | ICD-10-CM

## 2022-05-18 MED ORDER — ONDANSETRON 4 MG DISINTEGRATING TABLET
ORAL_TABLET | Freq: Three times a day (TID) | ORAL | 3 refills | 10 days | PRN
Start: 2022-05-18 — End: ?

## 2022-05-20 MED ORDER — ONDANSETRON 4 MG DISINTEGRATING TABLET
ORAL_TABLET | Freq: Three times a day (TID) | ORAL | 3 refills | 10 days | Status: CP | PRN
Start: 2022-05-20 — End: ?
  Filled 2022-05-21: qty 30, 10d supply, fill #0

## 2022-05-21 MED FILL — SODIUM CHLORIDE 7 % FOR NEBULIZATION: RESPIRATORY_TRACT | 30 days supply | Qty: 240 | Fill #1

## 2022-05-21 MED FILL — AZITHROMYCIN 250 MG TABLET: ORAL | 30 days supply | Qty: 30 | Fill #3

## 2022-05-21 MED FILL — VENTOLIN HFA 90 MCG/ACTUATION AEROSOL INHALER: RESPIRATORY_TRACT | 17 days supply | Qty: 18 | Fill #10

## 2022-05-27 NOTE — Unmapped (Signed)
[facility] Special[facility] Pharmacy Refill Coordination Note    Specialty Medication(s) to be Shipped:   CF/Pulmonary/Asthma: [name] 100mg /ml    Other medication(s) to be shipped: citalopram 20mg , Spiriva Respimat 2.64mcg and Symbicort 160-4.66mcg inhaler   *Patient's wife denied Pantoprazole at this timeHutson Crawford, DOB: [birth year]  Phone: There are no phone numbers on file.      All above HIPAA information was verified with patient's family member, wife.     Was a Nurse, learning disability used for this call? No    Completed refill call assessment today to schedule patient's medication shipment from the [facility] [phone]).  All relevant notes have been reviewed.     Specialty medication(s) and dose(s) confirmed: Regimen is correct and unchanged.   Changes to medications: Tykim reports no changes at this time.  Changes to insurance: No  New side effects reported not previously addressed with a pharmacist or physician: None reported  Questions for the pharmacist: No    Confirmed patient received a Conservation officer, historic buildings and a Surveyor, mining with first shipment. The patient will receive a drug information handout for each medication shipped and additional FDA Medication Guides as required.       DISEASE/MEDICATION-SPECIFIC INFORMATION        For patients on injectable medications: Patient currently has 0 doses left.  Next injection is scheduled for 06/17/2022.    SPECIALTY MEDICATION ADHERENCE     Medication Adherence    Patient reported X missed doses in the last month: 0  Specialty Medication: [name] 100 mg/mL [name]  Patient is on additional specialty medications: No  Any gaps in refill history greater than 2 weeks in the last 3 months: no  Demonstrates understanding of importance of adherence: yes  Informant: spouse  Reliability of informant: reliable              Confirmed plan for next specialty medication refill: delivery by pharmacy  Refills needed for supportive medications: not needed              Were doses missed due to medication being on hold? No    [name] 100 mg/mL [name]  : 0 days of medicine on hand        REFERRAL TO PHARMACIST     Referral to the pharmacist: Not needed      [facility]     Shipping address confirmed in Epic.     Delivery Scheduled: Yes, Expected medication delivery date: 05/30/2022.     Medication will be delivered via UPS to the prescription address in Epic WAM.    John Crawford   [facility] Specialty Technician

## 2022-05-29 MED FILL — CITALOPRAM 20 MG TABLET: ORAL | 30 days supply | Qty: 30 | Fill #1

## 2022-05-29 MED FILL — SPIRIVA RESPIMAT 2.5 MCG/ACTUATION SOLUTION FOR INHALATION: RESPIRATORY_TRACT | 30 days supply | Qty: 4 | Fill #6

## 2022-05-29 MED FILL — SYMBICORT 160 MCG-4.5 MCG/ACTUATION HFA AEROSOL INHALER: RESPIRATORY_TRACT | 30 days supply | Qty: 10.2 | Fill #3

## 2022-05-29 MED FILL — NUCALA 100 MG/ML SUBCUTANEOUS AUTO-INJECTOR: SUBCUTANEOUS | 28 days supply | Qty: 1 | Fill #5

## 2022-06-03 DIAGNOSIS — J479 Bronchiectasis, uncomplicated: Principal | ICD-10-CM

## 2022-06-03 DIAGNOSIS — J441 Chronic obstructive pulmonary disease with (acute) exacerbation: Principal | ICD-10-CM

## 2022-06-03 MED ORDER — FLUTICASONE PROPIONATE 115 MCG-SALMETEROL 21 MCG/ACTUATION HFA INHALER
Freq: Two times a day (BID) | RESPIRATORY_TRACT | 3 refills | 90 days | Status: CP
Start: 2022-06-03 — End: 2023-06-03
  Filled 2022-06-19: qty 36, 90d supply, fill #0

## 2022-06-06 NOTE — Unmapped (Signed)
[facility]ty Clinic Pharmacist Note     Purpose: Switching Inhalers Symbicort to Advair HFA due to insurance change    June 06, 2022 10:27 AM: Phone call to  patients wife  Spoke with Mrs. Corvin  regarding her husband's prescription inhaler change from Symbicort to Advair HFA 115-82mcg for next month.   Patient now has medicare and Advair HFA is on formulary. Per wife he doesn't use a spacer, and she's aware of the $4.30 copay for 3 month supply.   She received a letter in the mail from her insurance stating that [name] will not be covered anymore. She didn't know the specifics but, will get back to use later this afternoon with more details     Assessment/Plan:   Patient's wife agreeable to switch inhalers, understands plan   After the call, saw [name] PA was denied on Medicare Part D plan due to not being an FDA approved indication (bronchiectasis)  - will follow-up with Dr. Marlana Salvage if she would like to appeal this decision    Total time spent: 5 minutes     Electronically signed:  Senaida Ores PharmD Candidate    CC'd:   Dahlia Byes, MD     Clinical Pharmacist Attestation     I reviewed the PharmD Candidate's note and agree with documentation.    Cosigned by:   Prince Solian, PharmD, BCACP, CPP  Clinical Pharmacist Practitioner  [facility]  [phone]    June 06, 2022 10:46 AM.

## 2022-06-17 DIAGNOSIS — J479 Bronchiectasis, uncomplicated: Principal | ICD-10-CM

## 2022-06-19 MED FILL — AZITHROMYCIN 250 MG TABLET: ORAL | 30 days supply | Qty: 30 | Fill #4

## 2022-06-19 MED FILL — SODIUM CHLORIDE 7 % FOR NEBULIZATION: RESPIRATORY_TRACT | 30 days supply | Qty: 240 | Fill #2

## 2022-06-19 MED FILL — METOPROLOL SUCCINATE ER 50 MG TABLET,EXTENDED RELEASE 24 HR: ORAL | 90 days supply | Qty: 90 | Fill #2

## 2022-06-19 MED FILL — ALBUTEROL SULFATE 2.5 MG/3 ML (0.083 %) SOLUTION FOR NEBULIZATION: RESPIRATORY_TRACT | 30 days supply | Qty: 540 | Fill #1

## 2022-06-19 MED FILL — ONDANSETRON 4 MG DISINTEGRATING TABLET: ORAL | 10 days supply | Qty: 30 | Fill #1

## 2022-06-19 MED FILL — PANTOPRAZOLE 40 MG TABLET,DELAYED RELEASE: ORAL | 30 days supply | Qty: 60 | Fill #5

## 2022-06-19 NOTE — Unmapped (Signed)
John Crawford has been contacted in regards to their refill of [name] 100 mg/mL [name]. At this time, they have declined refill due to patient having 1 doses remaining. Refill assessment call date has been updated per the patient's request. [name] asked for a call back in 2 weeks.

## 2022-07-09 NOTE — Unmapped (Signed)
[facility] Refill Coordination Note    Specialty Medication(s) to be Shipped:   CF/Pulmonary/Asthma: [name] 100mg /ml    Other medication(s) to be shipped:  azithromycin 250mg , pantoprazole 40mg , citalopram 20mg , Spiriva Respimat 2.63mcg, Ventolin HFA     John Crawford, DOB: [birth year]  Phone: There are no phone numbers on file.      All above HIPAA information was verified with patient's family member, wife.     Was a Nurse, learning disability used for this call? No    Completed refill call assessment today to schedule patient's medication shipment from the [facility] [phone]).  All relevant notes have been reviewed.     Specialty medication(s) and dose(s) confirmed: Regimen is correct and unchanged.   Changes to medications: Narender reports no changes at this time.  Changes to insurance: No  New side effects reported not previously addressed with a pharmacist or physician: None reported  Questions for the pharmacist: No    Confirmed patient received a Conservation officer, historic buildings and a Surveyor, mining with first shipment. The patient will receive a drug information handout for each medication shipped and additional FDA Medication Guides as required.       DISEASE/MEDICATION-SPECIFIC INFORMATION        For patients on injectable medications: Patient currently has 0 doses left.  Next injection is scheduled for ? This week.    SPECIALTY MEDICATION ADHERENCE     Medication Adherence    Patient reported X missed doses in the last month: 0  Specialty Medication: [name] 100 mg/mL [name]  Patient is on additional specialty medications: No  Patient is on more than two specialty medications: No  Informant: spouse  Reliability of informant: reliable  Provider-estimated medication adherence level: good  Patient is at risk for Non-Adherence: No  Reasons for non-adherence: no problems identified                                Were doses missed due to medication being on hold? No    [name] 100 mg/mL [name]  : 0 days of medicine on hand       REFERRAL TO PHARMACIST     Referral to the pharmacist: Not needed      [facility]     Shipping address confirmed in Epic.     Delivery Scheduled: Yes, Expected medication delivery date: 07/12/22.     Medication will be delivered via UPS to the prescription address in Epic WAM.    Mattia Osterman' W Wilhemena Durie Shared [facility] Specialty Technician

## 2022-07-11 DIAGNOSIS — J441 Chronic obstructive pulmonary disease with (acute) exacerbation: Principal | ICD-10-CM

## 2022-07-11 MED FILL — PANTOPRAZOLE 40 MG TABLET,DELAYED RELEASE: ORAL | 30 days supply | Qty: 60 | Fill #6

## 2022-07-11 MED FILL — AZITHROMYCIN 250 MG TABLET: ORAL | 30 days supply | Qty: 30 | Fill #5

## 2022-07-11 MED FILL — NUCALA 100 MG/ML SUBCUTANEOUS AUTO-INJECTOR: SUBCUTANEOUS | 28 days supply | Qty: 1 | Fill #6

## 2022-07-11 MED FILL — CITALOPRAM 20 MG TABLET: ORAL | 30 days supply | Qty: 30 | Fill #2

## 2022-07-11 MED FILL — SPIRIVA RESPIMAT 2.5 MCG/ACTUATION SOLUTION FOR INHALATION: RESPIRATORY_TRACT | 30 days supply | Qty: 4 | Fill #7

## 2022-07-11 MED FILL — VENTOLIN HFA 90 MCG/ACTUATION AEROSOL INHALER: RESPIRATORY_TRACT | 17 days supply | Qty: 18 | Fill #11

## 2022-07-29 DIAGNOSIS — J479 Bronchiectasis, uncomplicated: Principal | ICD-10-CM

## 2022-07-29 MED ORDER — ALBUTEROL SULFATE HFA 90 MCG/ACTUATION AEROSOL INHALER
RESPIRATORY_TRACT | 11 refills | 17 days | Status: CP | PRN
Start: 2022-07-29 — End: ?
  Filled 2022-08-07: qty 18, 17d supply, fill #0

## 2022-07-29 MED ORDER — METOPROLOL SUCCINATE ER 50 MG TABLET,EXTENDED RELEASE 24 HR
ORAL_TABLET | Freq: Every day | ORAL | 2 refills | 90 days
Start: 2022-07-29 — End: 2023-04-27

## 2022-07-29 MED ORDER — NUCALA 100 MG/ML SUBCUTANEOUS AUTO-INJECTOR
SUBCUTANEOUS | 6 refills | 28 days | Status: CP
Start: 2022-07-29 — End: ?
  Filled 2022-08-07: qty 1, 28d supply, fill #0

## 2022-08-06 DIAGNOSIS — I4892 Unspecified atrial flutter: Principal | ICD-10-CM

## 2022-08-06 NOTE — Unmapped (Signed)
[facility] Special[facility] Pharmacy Refill Coordination Note    Specialty Medication(s) to be Shipped:   CF/Pulmonary/Asthma: [name] 100mg /ml    Other medication(s) to be shipped:  citalopram 20mg , azithromycin 250mg , pantoprazole 40mg , albuterol 2.5mg /84ml neb solution, Spiriva Respimat 2.4mcg, Ventolin HFA , ondansetron 4mg , Xarelto 20mg      John Crawford, DOB: 1956/07/06  Phone: There are no phone numbers on file.      All above HIPAA information was verified with patient's family member, wife.     Was a Nurse, learning disability used for this call? No    Completed refill call assessment today to schedule patient's medication shipment from the [facility] [phone]).  All relevant notes have been reviewed.     Specialty medication(s) and dose(s) confirmed: Regimen is correct and unchanged.   Changes to medications: John Crawford reports no changes at this time.  Changes to insurance: No  New side effects reported not previously addressed with a pharmacist or physician: None reported  Questions for the pharmacist: No    Confirmed patient received a Conservation officer, historic buildings and a Surveyor, mining with first shipment. The patient will receive a drug information handout for each medication shipped and additional FDA Medication Guides as required.       DISEASE/MEDICATION-SPECIFIC INFORMATION        For patients on injectable medications: Patient currently has 1 doses left.  Next injection is scheduled for 08/13/22.    SPECIALTY MEDICATION ADHERENCE     Medication Adherence    Patient reported X missed doses in the last month: 0  Specialty Medication: [name] 100 mg/mL [name]  Patient is on additional specialty medications: No  Patient is on more than two specialty medications: No  Any gaps in refill history greater than 2 weeks in the last 3 months: no  Demonstrates understanding of importance of adherence: yes  Informant: spouse  Reliability of informant: reliable  Provider-estimated medication adherence level: good  Patient is at risk for Non-Adherence: No  Reasons for non-adherence: no problems identified                                Were doses missed due to medication being on hold? No    [name] 100 mg/mL [name] : 0 days of medicine on hand       REFERRAL TO PHARMACIST     Referral to the pharmacist: Not needed      [facility]     Shipping address confirmed in Epic.     Delivery Scheduled: Yes, Expected medication delivery date: 08/08/22.     Medication will be delivered via UPS to the prescription address in Epic WAM.    John Crawford' W John Crawford Shared [facility] Specialty Technician

## 2022-08-07 MED FILL — XARELTO 20 MG TABLET: ORAL | 90 days supply | Qty: 90 | Fill #1

## 2022-08-07 MED FILL — CITALOPRAM 20 MG TABLET: ORAL | 30 days supply | Qty: 30 | Fill #3

## 2022-08-07 MED FILL — ONDANSETRON 4 MG DISINTEGRATING TABLET: ORAL | 10 days supply | Qty: 30 | Fill #2

## 2022-08-07 MED FILL — PANTOPRAZOLE 40 MG TABLET,DELAYED RELEASE: ORAL | 30 days supply | Qty: 60 | Fill #7

## 2022-08-07 MED FILL — SPIRIVA RESPIMAT 2.5 MCG/ACTUATION SOLUTION FOR INHALATION: RESPIRATORY_TRACT | 30 days supply | Qty: 4 | Fill #8

## 2022-08-07 MED FILL — ALBUTEROL SULFATE 2.5 MG/3 ML (0.083 %) SOLUTION FOR NEBULIZATION: RESPIRATORY_TRACT | 30 days supply | Qty: 540 | Fill #2

## 2022-08-07 MED FILL — AZITHROMYCIN 250 MG TABLET: ORAL | 30 days supply | Qty: 30 | Fill #6

## 2022-08-28 MED ORDER — METOPROLOL SUCCINATE ER 50 MG TABLET,EXTENDED RELEASE 24 HR
ORAL_TABLET | Freq: Every day | ORAL | 2 refills | 90 days
Start: 2022-08-28 — End: 2023-05-27

## 2022-08-29 NOTE — Unmapped (Signed)
[facility] Special[facility] Pharmacy Refill Coordination Note    Specialty Medication(s) to be Shipped:   CF/Pulmonary/Asthma: [name] 100mg /ml    Other medication(s) to be shipped:  metoprolol succinate 50mg , Ventolin HFA, azithromycin 250mg , Advair HFA, Spiriva Respimat, citalopram 20mg , pantoprazole 40mg , ondansetron 4md odt      John Crawford, DOB: 26-Nov-1955  Phone: There are no phone numbers on file.      All above HIPAA information was verified with patient's family member, wife.     Was a Nurse, learning disability used for this call? No    Completed refill call assessment today to schedule patient's medication shipment from the [facility] [phone]).  All relevant notes have been reviewed.     Specialty medication(s) and dose(s) confirmed: Regimen is correct and unchanged.   Changes to medications: John Crawford reports no changes at this time.  Changes to insurance: No  New side effects reported not previously addressed with a pharmacist or physician: None reported  Questions for the pharmacist: No    Confirmed patient received a Conservation officer, historic buildings and a Surveyor, mining with first shipment. The patient will receive a drug information handout for each medication shipped and additional FDA Medication Guides as required.       DISEASE/MEDICATION-SPECIFIC INFORMATION        For patients on injectable medications: Patient currently has 0 doses left.  Next injection is scheduled for 09/13/22.    SPECIALTY MEDICATION ADHERENCE     Medication Adherence    Patient reported X missed doses in the last month: 0  Specialty Medication: [name] 100 mg/mL [name]  Patient is on additional specialty medications: No  Patient is on more than two specialty medications: No  Any gaps in refill history greater than 2 weeks in the last 3 months: no  Demonstrates understanding of importance of adherence: yes  Informant: spouse  Reliability of informant: reliable  Provider-estimated medication adherence level: good  Patient is at risk for Non-Adherence: No  Reasons for non-adherence: no problems identified                                Were doses missed due to medication being on hold? No    [name] 100 mg/mL [name]  : 0 days of medicine on hand       REFERRAL TO PHARMACIST     Referral to the pharmacist: Not needed      [facility]     Shipping address confirmed in Epic.     Delivery Scheduled: Yes, Expected medication delivery date: 09/06/22.     Medication will be delivered via UPS to the prescription address in Epic WAM.    John Crawford' W Wilhemena Durie Shared [facility] Specialty Technician

## 2022-09-05 MED FILL — VENTOLIN HFA 90 MCG/ACTUATION AEROSOL INHALER: RESPIRATORY_TRACT | 17 days supply | Qty: 18 | Fill #1

## 2022-09-05 MED FILL — PANTOPRAZOLE 40 MG TABLET,DELAYED RELEASE: ORAL | 30 days supply | Qty: 60 | Fill #8

## 2022-09-05 MED FILL — AZITHROMYCIN 250 MG TABLET: ORAL | 30 days supply | Qty: 30 | Fill #7

## 2022-09-05 MED FILL — ONDANSETRON 4 MG DISINTEGRATING TABLET: ORAL | 10 days supply | Qty: 30 | Fill #3

## 2022-09-05 MED FILL — CITALOPRAM 20 MG TABLET: ORAL | 30 days supply | Qty: 30 | Fill #4

## 2022-09-05 MED FILL — SPIRIVA RESPIMAT 2.5 MCG/ACTUATION SOLUTION FOR INHALATION: RESPIRATORY_TRACT | 30 days supply | Qty: 4 | Fill #9

## 2022-09-05 MED FILL — ADVAIR HFA 115 MCG-21 MCG/ACTUATION AEROSOL INHALER: RESPIRATORY_TRACT | 90 days supply | Qty: 36 | Fill #1

## 2022-09-05 MED FILL — NUCALA 100 MG/ML SUBCUTANEOUS AUTO-INJECTOR: SUBCUTANEOUS | 28 days supply | Qty: 1 | Fill #1

## 2022-09-06 MED ORDER — METOPROLOL SUCCINATE ER 50 MG TABLET,EXTENDED RELEASE 24 HR
ORAL_TABLET | Freq: Every day | ORAL | 2 refills | 90 days
Start: 2022-09-06 — End: 2023-06-05

## 2022-09-25 MED ORDER — METOPROLOL SUCCINATE ER 50 MG TABLET,EXTENDED RELEASE 24 HR
ORAL_TABLET | Freq: Every day | ORAL | 2 refills | 90 days
Start: 2022-09-25 — End: 2023-06-24

## 2022-09-25 NOTE — Unmapped (Addendum)
John Crawford has been contacted in regards to their refill of [name] 100 mg/mL [name]. At this time, they have declined refill due to  [name] says there is an injection on hand for January 8th. Please call back in 30 days. Maintenance medications scheduled for delivery . Refill assessment call date has been updated per the patient's request.

## 2022-09-27 MED ORDER — METOPROLOL SUCCINATE ER 50 MG TABLET,EXTENDED RELEASE 24 HR
ORAL_TABLET | Freq: Every day | ORAL | 2 refills | 90.00000 days
Start: 2022-09-27 — End: 2023-06-26

## 2022-10-01 MED FILL — CITALOPRAM 20 MG TABLET: ORAL | 30 days supply | Qty: 30 | Fill #5

## 2022-10-01 MED FILL — SPIRIVA RESPIMAT 2.5 MCG/ACTUATION SOLUTION FOR INHALATION: RESPIRATORY_TRACT | 30 days supply | Qty: 4 | Fill #10

## 2022-10-01 MED FILL — PANTOPRAZOLE 40 MG TABLET,DELAYED RELEASE: ORAL | 30 days supply | Qty: 60 | Fill #9

## 2022-10-01 MED FILL — AZITHROMYCIN 250 MG TABLET: ORAL | 30 days supply | Qty: 30 | Fill #8

## 2022-10-03 MED ORDER — METOPROLOL SUCCINATE ER 50 MG TABLET,EXTENDED RELEASE 24 HR
ORAL_TABLET | Freq: Every day | ORAL | 2 refills | 90 days
Start: 2022-10-03 — End: 2023-07-02

## 2022-10-19 DIAGNOSIS — R110 Nausea: Principal | ICD-10-CM

## 2022-10-19 MED ORDER — METOPROLOL SUCCINATE ER 50 MG TABLET,EXTENDED RELEASE 24 HR
ORAL_TABLET | Freq: Every day | ORAL | 2 refills | 90 days
Start: 2022-10-19 — End: 2023-07-18

## 2022-10-19 MED ORDER — ONDANSETRON 4 MG DISINTEGRATING TABLET
ORAL_TABLET | Freq: Three times a day (TID) | ORAL | 3 refills | 10 days | PRN
Start: 2022-10-19 — End: ?

## 2022-10-22 MED ORDER — ONDANSETRON 4 MG DISINTEGRATING TABLET
ORAL_TABLET | Freq: Three times a day (TID) | ORAL | 3 refills | 10 days | Status: CP | PRN
Start: 2022-10-22 — End: ?
  Filled 2022-10-31: qty 30, 10d supply, fill #0

## 2022-10-23 MED ORDER — RIVAROXABAN 20 MG TABLET
ORAL_TABLET | Freq: Every day | ORAL | 1 refills | 90 days
Start: 2022-10-23 — End: 2023-04-21

## 2022-10-23 MED ORDER — CITALOPRAM 20 MG TABLET
ORAL_TABLET | Freq: Every day | ORAL | 1 refills | 90 days
Start: 2022-10-23 — End: 2023-04-21

## 2022-10-23 MED ORDER — METOPROLOL SUCCINATE ER 50 MG TABLET,EXTENDED RELEASE 24 HR
ORAL_TABLET | Freq: Every day | ORAL | 2 refills | 90 days
Start: 2022-10-23 — End: 2023-07-22

## 2022-10-24 MED ORDER — RIVAROXABAN 20 MG TABLET
ORAL_TABLET | Freq: Every day | ORAL | 1 refills | 90 days
Start: 2022-10-24 — End: 2023-04-22

## 2022-10-24 MED ORDER — CITALOPRAM 20 MG TABLET
ORAL_TABLET | Freq: Every day | ORAL | 1 refills | 90 days
Start: 2022-10-24 — End: 2023-04-22

## 2022-10-24 MED ORDER — METOPROLOL SUCCINATE ER 50 MG TABLET,EXTENDED RELEASE 24 HR
ORAL_TABLET | Freq: Every day | ORAL | 2 refills | 90 days
Start: 2022-10-24 — End: 2023-07-23

## 2022-10-25 NOTE — Unmapped (Signed)
Past 14 days   Refused Yesterday (10/23/2022):   Patient no longer under prescriber care   metoPROLOL succinate (TOPROL-XL) 50 MG 24 hr tablet   Sig: Take 1 tablet (50 mg total) by mouth daily.   Disp: 90 tablet    Refills: 2   Received from: Patient Requested   Encounter Details

## 2022-10-29 MED ORDER — RIVAROXABAN 20 MG TABLET
ORAL_TABLET | Freq: Every day | ORAL | 1 refills | 90 days
Start: 2022-10-29 — End: 2023-04-27

## 2022-10-29 MED ORDER — METOPROLOL SUCCINATE ER 50 MG TABLET,EXTENDED RELEASE 24 HR
ORAL_TABLET | Freq: Every day | ORAL | 2 refills | 90 days
Start: 2022-10-29 — End: 2023-07-28

## 2022-10-29 MED ORDER — CITALOPRAM 20 MG TABLET
ORAL_TABLET | Freq: Every day | ORAL | 1 refills | 90 days
Start: 2022-10-29 — End: 2023-04-27

## 2022-10-29 NOTE — Unmapped (Signed)
[facility] Pharmacy Refill Coordination Note    Specialty Medication(s) to be Shipped:   CF/Pulmonary/Asthma: [name] 100 mg/mL    Other medication(s) to be shipped: No additional medications requested for fill at this time     John Crawford, DOB: [birth year]  Phone: There are no phone numbers on file.      All above HIPAA information was verified with patient's family member, Wife.     Was a Nurse, learning disability used for this call? No    Completed refill call assessment today to schedule patient's medication shipment from the [facility] [phone]).  All relevant notes have been reviewed.     Specialty medication(s) and dose(s) confirmed: Regimen is correct and unchanged.   Changes to medications: [name] reports no changes at this time.  Changes to insurance: No  New side effects reported not previously addressed with a pharmacist or physician: None reported  Questions for the pharmacist: No    Confirmed patient received a Conservation officer, historic buildings and a Surveyor, mining with first shipment. The patient will receive a drug information handout for each medication shipped and additional FDA Medication Guides as required.       DISEASE/MEDICATION-SPECIFIC INFORMATION        For patients on injectable medications: Patient currently has 0 doses left.  Next injection is scheduled for 11/11/2022.    SPECIALTY MEDICATION ADHERENCE     Medication Adherence    Specialty Medication: [name] 100 mg/mL Q28d  Patient is on additional specialty medications: No                                Were doses missed due to medication being on hold? No    [name] 100 mg/ml: 0 days of medicine on hand       REFERRAL TO PHARMACIST     Referral to the pharmacist: Not needed      [facility]     Shipping address confirmed in Epic.     Delivery Scheduled: Yes, Expected medication delivery date: 10/31/2022.     Medication will be delivered via UPS to the prescription address in Epic WAM.    Alwyn Pea   [facility] Specialty Technician

## 2022-10-30 DIAGNOSIS — J479 Bronchiectasis, uncomplicated: Principal | ICD-10-CM

## 2022-10-30 MED ORDER — METOPROLOL SUCCINATE ER 50 MG TABLET,EXTENDED RELEASE 24 HR
ORAL_TABLET | Freq: Every day | ORAL | 2 refills | 90 days
Start: 2022-10-30 — End: 2023-07-29

## 2022-10-31 MED FILL — AZITHROMYCIN 250 MG TABLET: ORAL | 30 days supply | Qty: 30 | Fill #9

## 2022-10-31 MED FILL — VENTOLIN HFA 90 MCG/ACTUATION AEROSOL INHALER: RESPIRATORY_TRACT | 17 days supply | Qty: 18 | Fill #2

## 2022-10-31 MED FILL — NUCALA 100 MG/ML SUBCUTANEOUS AUTO-INJECTOR: SUBCUTANEOUS | 28 days supply | Qty: 1 | Fill #2

## 2022-10-31 MED FILL — PANTOPRAZOLE 40 MG TABLET,DELAYED RELEASE: ORAL | 30 days supply | Qty: 60 | Fill #10

## 2022-10-31 MED FILL — SPIRIVA RESPIMAT 2.5 MCG/ACTUATION SOLUTION FOR INHALATION: RESPIRATORY_TRACT | 30 days supply | Qty: 4 | Fill #11

## 2022-11-04 MED ORDER — RIVAROXABAN 20 MG TABLET
ORAL_TABLET | Freq: Every day | ORAL | 1 refills | 90 days
Start: 2022-11-04 — End: 2023-05-03

## 2022-11-04 MED ORDER — CITALOPRAM 20 MG TABLET
ORAL_TABLET | Freq: Every day | ORAL | 1 refills | 90 days
Start: 2022-11-04 — End: 2023-05-03

## 2022-11-04 NOTE — Unmapped (Signed)
I do not prescribe these medications for this patient

## 2022-11-06 MED ORDER — CITALOPRAM 20 MG TABLET
ORAL_TABLET | Freq: Every day | ORAL | 1 refills | 90 days
Start: 2022-11-06 — End: 2023-05-05

## 2022-11-06 MED ORDER — RIVAROXABAN 20 MG TABLET
ORAL_TABLET | Freq: Every day | ORAL | 1 refills | 90 days
Start: 2022-11-06 — End: 2023-05-05

## 2022-11-06 MED ORDER — METOPROLOL SUCCINATE ER 50 MG TABLET,EXTENDED RELEASE 24 HR
ORAL_TABLET | Freq: Every day | ORAL | 2 refills | 90 days
Start: 2022-11-06 — End: 2023-08-05

## 2022-11-06 NOTE — Unmapped (Signed)
Requested Prescriptions     Pending Prescriptions Disp Refills    citalopram (CELEXA) 20 MG tablet 90 tablet 1     Sig: Take 1 tablet (20 mg total) by mouth daily.    rivaroxaban (XARELTO) 20 mg tablet 90 tablet 1     Sig: Take 1 tablet (20 mg total) by mouth daily with evening meal.    metoPROLOL succinate (TOPROL-XL) 50 MG 24 hr tablet 90 tablet 2     Sig: Take 1 tablet (50 mg total) by mouth daily.

## 2022-11-07 MED ORDER — METOPROLOL SUCCINATE ER 50 MG TABLET,EXTENDED RELEASE 24 HR
ORAL_TABLET | Freq: Every day | ORAL | 2 refills | 90 days | Status: CP
Start: 2022-11-07 — End: 2023-08-06

## 2022-11-07 MED ORDER — CITALOPRAM 20 MG TABLET
ORAL_TABLET | Freq: Every day | ORAL | 1 refills | 90 days | Status: CP
Start: 2022-11-07 — End: 2023-05-06

## 2022-11-07 MED ORDER — RIVAROXABAN 20 MG TABLET
ORAL_TABLET | Freq: Every day | ORAL | 1 refills | 90 days | Status: CP
Start: 2022-11-07 — End: 2023-05-06

## 2022-11-08 ENCOUNTER — Ambulatory Visit: Admit: 2022-11-08 | Discharge: 2022-11-09 | Payer: MEDICARE

## 2022-11-08 DIAGNOSIS — J449 Chronic obstructive pulmonary disease, unspecified: Principal | ICD-10-CM

## 2022-11-08 DIAGNOSIS — G4733 Obstructive sleep apnea (adult) (pediatric): Principal | ICD-10-CM

## 2022-11-08 DIAGNOSIS — G4730 Sleep apnea, unspecified: Principal | ICD-10-CM

## 2022-11-08 DIAGNOSIS — I4811 Longstanding persistent atrial fibrillation: Principal | ICD-10-CM

## 2022-11-08 LAB — BASIC METABOLIC PANEL
ANION GAP: 5 mmol/L (ref 5–14)
BLOOD UREA NITROGEN: 13 mg/dL (ref 9–23)
BUN / CREAT RATIO: 21
CALCIUM: 9.6 mg/dL (ref 8.7–10.4)
CHLORIDE: 103 mmol/L (ref 98–107)
CO2: 32 mmol/L — ABNORMAL HIGH (ref 20.0–31.0)
CREATININE: 0.62 mg/dL — ABNORMAL LOW
EGFR CKD-EPI (2021) MALE: >90 mL/min/{1.73_m2} (ref >=60)
GLUCOSE RANDOM: 110 mg/dL (ref 70–179)
POTASSIUM: 4.9 mmol/L — ABNORMAL HIGH (ref 3.4–4.8)
SODIUM: 140 mmol/L (ref 135–145)

## 2022-11-08 MED ORDER — CITALOPRAM 20 MG TABLET
ORAL_TABLET | Freq: Every day | ORAL | 1 refills | 90 days | Status: CP
Start: 2022-11-08 — End: 2023-05-07

## 2022-11-08 MED ORDER — PREDNISONE 10 MG TABLET
ORAL_TABLET | Freq: Every day | ORAL | 11 refills | 30 days | Status: CP
Start: 2022-11-08 — End: ?

## 2022-11-08 MED ORDER — ALBUTEROL SULFATE HFA 90 MCG/ACTUATION AEROSOL INHALER
RESPIRATORY_TRACT | 11 refills | 17 days | Status: CP | PRN
Start: 2022-11-08 — End: ?

## 2022-11-08 MED ORDER — SPIRIVA RESPIMAT 2.5 MCG/ACTUATION SOLUTION FOR INHALATION
Freq: Every day | RESPIRATORY_TRACT | 11 refills | 30 days | Status: CP
Start: 2022-11-08 — End: 2023-11-08
  Filled 2022-11-22: qty 4, 30d supply, fill #0

## 2022-11-08 MED ORDER — RIVAROXABAN 20 MG TABLET
ORAL_TABLET | Freq: Every day | ORAL | 1 refills | 90 days | Status: CP
Start: 2022-11-08 — End: 2023-05-07

## 2022-11-08 MED ORDER — FLUTICASONE PROPIONATE 115 MCG-SALMETEROL 21 MCG/ACTUATION HFA INHALER
Freq: Two times a day (BID) | RESPIRATORY_TRACT | 3 refills | 90 days | Status: CP
Start: 2022-11-08 — End: 2023-11-08

## 2022-11-08 MED ORDER — METOPROLOL SUCCINATE ER 50 MG TABLET,EXTENDED RELEASE 24 HR
ORAL_TABLET | Freq: Every day | ORAL | 2 refills | 90 days | Status: CP
Start: 2022-11-08 — End: 2023-08-07

## 2022-11-08 NOTE — Unmapped (Signed)
Internal Medicine Clinic Visit    Reason for visit: Follow up of chronic conditions    A/P: 67 year old male with PMH of Afib/Aflutter, COPD on 3L, Bronchiectasis, Tobacco use, OSA, Bipolar disorder who presents as above    Hypertension  Blood pressure elevated to 154/81 in clinic today. Patient is not interested in starting any medications at this time. Discussed dietary and lifestyle interventions.   -Will purchase home BP cuff to monitor at home    Afib/Aflutter  In clinic HR is 72 BMP. Per patient no episodes of Afib/Aflutter or RVR. Current regimen Metop Succinate 50, Rivaroxaban 20mg .  -Continue current regimen  -BMP to evaluate renal function in the setting of Rivaroxaban use    Bipolar Disorder  History of Bipolar disorder. Started on Citalopram in 2019 with good control. Mood has been even without any episodes of depression or mania.  -Continue current regimen    COPD   Follows with [facility]. Current regimen Prednisone 10mg , Azithromycin, Mepolizumab, Advair BID, Spiriva, albuterol PRN (using up to 4x daily). Currently on 3L O2 mask. Currently at baseline O2 and level of dyspnea.  -Continue current regimen  -Follow up with pulmonology     Bronchiectasis   Upper lobe predominant but diffuse bronchiectasis. Current regimen Aerobika BID with albuterol & HTS 7%.  -Continue current regimen  -Follow up with pulmonology     Tobacco use  Currently smoking 1/2 pack per day. Not interested in quitting.   -Continue to encourage smoking cessation    Lung nodule  Lung cancer screening CT (06/2020) with 6mm nodule Lung-RADS 3S, previously held off on repeat imaging due to limited life-expectancy with repeated ED visits. After clinical improvement patient was restarted on screening. Last CT (03/2022) with Lung-RADS 1.  -Due for low dose CT scan 03/2023, will order in June (patient would like to think about this further)    Daytime Fatigue    -Due for sleep study with CPAP titration, will order today    Health Maintenance  General Screening   -DM screening: A1C 5.3% (02/2020) deferred  -Lipid screening: LDL 68 (02/2020) deferred  Cancer Screening  -Colorectal screening: deferred  -Prostate Cancer screening: deferred  Vaccines  -Flu vaccine today  Smoking Related Screening  -Abdominal Aortic Aneurysm screening: Abdominal US (Men 65-75 with any smoking history) deferred    Return in about 6 months (around 05/09/2023).    Staffed with Dr. Lennie Muckle, seen and discussed    __________________________________________________________    HPI:    Patient presents to clinic today for routine follow up. Overall he feels he is doing relatively well in relation to his recent baseline. He still has significant dyspnea but has not had a recent exacerbation. He is using his inhalers and mucus clearance devices without issue. He has not noted any episodes of Afib or abnormal rhythm. He has no new concerns or complaints. He states that he understands his respiratory issues will likely limit his lifespan and that in the setting of this he is not particularly interested in many routine health maintenance of screening measures. His blood pressure is elevated but he is not interested in starting any medications.  __________________________________________________________      Medications:  Reviewed in EPIC  __________________________________________________________    Physical Exam:   Vital Signs:  Vitals:    11/08/22 0839   BP: 154/81   Pulse: 72   Resp: 18   Temp: 36.7 ??C (98 ??F)   TempSrc: Temporal   SpO2: 97%  Weight: 83.3 kg (183 lb 9.6 oz)   Height: 175.3 cm (5' 9)     Gen: Chronically ill appearing adult male in no acute distress. O2 mask in place.  CV: RRR, no murmurs, gallops or rubs.  Pulm: Rhonchorous breath sounds with expiratory wheezing throughout.   Abd: Soft, NTND, normal BS.   Ext: No edema    PHQ-9 Score:     GAD-7 Score:       Medication adherence and barriers to the treatment plan have been addressed. Opportunities to optimize healthy behaviors have been discussed. Patient / caregiver voiced understanding.

## 2022-11-08 NOTE — Unmapped (Addendum)
Thank you for coming to clinic today. We sent a refill for your medications to [facility] pharmacy. Please continue taking all of your medications as prescribed. Please check your blood pressure at home once daily and record this log (if your blood pressure is greater than 130/80 then you should discuss starting medications with your PCP). You should receive a phone call to set up your CPAP sleep titration study.

## 2022-11-08 NOTE — Unmapped (Signed)
Med refill

## 2022-11-08 NOTE — Unmapped (Unsigned)
[facility] Internal Medicine at [facility]     Reason for visit: Meds refill    Questions / Concerns that need to be addressed: meds refills.  148/79, 76  Omron BPs (complete if screening BP has a systolic  > 129 or diastolic > 79)  BP#1 156/82   BP#2 152/80  BP#3 154/82    Average BP 154/81  (please note this as a comment in vitals)       Allergies reviewed: Yes    Medication reviewed: Yes  Pended refills? Yes        HCDM reviewed and updated in Epic:    We are working to make sure all of our patients??? wishes are updated in Epic and part of that is documenting a Environmental health practitioner for each patient  A Health Care Decision Maker is someone you choose who can make health care decisions for you if you are not able - who would you most want to do this for you????  was updated.        BPAs completed:  PHQ2  Influenza vaccination      COVID-19 Vaccine Summary  Which COVID-19 Vaccine was administered  Moderna  Type:  Dates Given:                   If no: Are you interested in scheduling?     Immunization History   Administered Date(s) Administered    COVID-19 VACCINE,MRNA(MODERNA)(PF) 05/24/2020, 06/19/2020, 07/19/2020    Influenza Vaccine Quad(IM)6 MO-Adult(PF) 12/16/2019, 06/19/2020, 08/13/2021    Pneumococcal Conjugate 13-Valent 08/28/2020    Pneumococcal Conjugate 20-valent 11/26/2021    TD(TDVAX),ADSORBED,2LF(IM)(PF) 03/27/2010    TdaP 03/23/2018       __________________________________________________________________________________________    SCREENINGS COMPLETED IN FLOWSHEETS    HARK Screening       AUDIT       PHQ2       PHQ9          Link for Multi-language PHQ/GAD screeners: [email or web address]    P4 Suicidality Screener                GAD7       COPD Assessment       Falls Risk       .imcres

## 2022-11-09 DIAGNOSIS — E875 Hyperkalemia: Principal | ICD-10-CM

## 2022-11-18 NOTE — Unmapped (Signed)
[facility] Specialty Pharmacy Refill Coordination Note    Specialty Medication(s) to be Shipped:   CF/Pulmonary/Asthma: [name] 100mg /ml    Other medication(s) to be shipped:  Spiriva Respimat 2.52mcg     John Crawford, DOB: [birth year]  Phone: There are no phone numbers on file.      All above HIPAA information was verified with patient's family member, wife.     Was a Nurse, learning disability used for this call? No    Completed refill call assessment today to schedule patient's medication shipment from the [facility] [phone]).  All relevant notes have been reviewed.     Specialty medication(s) and dose(s) confirmed: Regimen is correct and unchanged.   Changes to medications: Majd reports no changes at this time.  Changes to insurance: No  New side effects reported not previously addressed with a pharmacist or physician: None reported  Questions for the pharmacist: No    Confirmed patient received a Conservation officer, historic buildings and a Surveyor, mining with first shipment. The patient will receive a drug information handout for each medication shipped and additional FDA Medication Guides as required.       DISEASE/MEDICATION-SPECIFIC INFORMATION        For patients on injectable medications: Patient currently has 0 doses left.  Next injection is scheduled for 12/13/22.    SPECIALTY MEDICATION ADHERENCE     Medication Adherence    Patient reported X missed doses in the last month: 0  Specialty Medication: [name] 100 mg/mL [name]  Patient is on additional specialty medications: No  Patient is on more than two specialty medications: No  Any gaps in refill history greater than 2 weeks in the last 3 months: no  Demonstrates understanding of importance of adherence: yes  Informant: spouse  Reliability of informant: reliable  Provider-estimated medication adherence level: good  Patient is at risk for Non-Adherence: No  Reasons for non-adherence: no problems identified              Were doses missed due to medication being on hold? No    [name] 100 mg/mL [name]  : 0 days of medicine on hand       REFERRAL TO PHARMACIST     Referral to the pharmacist: Not needed      [facility]     Shipping address confirmed in Epic.     Patient was notified of new phone menu : No    Delivery Scheduled: Yes, Expected medication delivery date: 11/22/22.     Medication will be delivered via UPS to the prescription address in Epic WAM.    Detrich Rakestraw' W Danae Chen Shared [facility] Specialty Technician

## 2022-11-20 DIAGNOSIS — J449 Chronic obstructive pulmonary disease, unspecified: Principal | ICD-10-CM

## 2022-11-21 MED FILL — NUCALA 100 MG/ML SUBCUTANEOUS AUTO-INJECTOR: SUBCUTANEOUS | 28 days supply | Qty: 1 | Fill #3

## 2022-11-22 NOTE — Unmapped (Signed)
Spoke with patient/family regarding repeat potassium testing. Expressed need to return to clinic for labs. They are planning to come in on Monday 02/19.

## 2022-11-25 ENCOUNTER — Ambulatory Visit: Admit: 2022-11-25 | Discharge: 2022-11-26 | Payer: MEDICARE

## 2022-11-25 DIAGNOSIS — E875 Hyperkalemia: Principal | ICD-10-CM

## 2022-11-25 LAB — BASIC METABOLIC PANEL
ANION GAP: 3 mmol/L — ABNORMAL LOW (ref 5–14)
BLOOD UREA NITROGEN: 11 mg/dL (ref 9–23)
BUN / CREAT RATIO: 15
CALCIUM: 9.3 mg/dL (ref 8.7–10.4)
CHLORIDE: 102 mmol/L (ref 98–107)
CO2: 32.7 mmol/L — ABNORMAL HIGH (ref 20.0–31.0)
CREATININE: 0.72 mg/dL — ABNORMAL LOW
EGFR CKD-EPI (2021) MALE: >90 mL/min/{1.73_m2} (ref >=60)
GLUCOSE RANDOM: 121 mg/dL — ABNORMAL HIGH (ref 70–99)
POTASSIUM: 4.4 mmol/L (ref 3.4–4.8)
SODIUM: 138 mmol/L (ref 135–145)

## 2022-12-17 NOTE — Unmapped (Signed)
[facility] Refill Coordination Note    Specialty Medication(s) to be Shipped:   CF/Pulmonary/Asthma: [name] 100mg /ml    Other medication(s) to be shipped:  Spiriva Respimat 2.67mcg, ondansetron 4mg  odt     John Crawford, DOB: August 28, 1956  Phone: There are no phone numbers on file.      All above HIPAA information was verified with patient's family member, wife.     Was a Nurse, learning disability used for this call? No    Completed refill call assessment today to schedule patient's medication shipment from the [facility] [phone]).  All relevant notes have been reviewed.     Specialty medication(s) and dose(s) confirmed: Regimen is correct and unchanged.   Changes to medications: Sumedh reports no changes at this time.  Changes to insurance: No  New side effects reported not previously addressed with a pharmacist or physician: None reported  Questions for the pharmacist: No    Confirmed patient received a Conservation officer, historic buildings and a Surveyor, mining with first shipment. The patient will receive a drug information handout for each medication shipped and additional FDA Medication Guides as required.       DISEASE/MEDICATION-SPECIFIC INFORMATION        For patients on injectable medications: Patient currently has 0 doses left.  Next injection is scheduled for 01/02/23.    SPECIALTY MEDICATION ADHERENCE     Medication Adherence    Patient reported X missed doses in the last month: 0  Specialty Medication: [name] 100 mg/mL [name]  Patient is on additional specialty medications: No  Patient is on more than two specialty medications: No  Any gaps in refill history greater than 2 weeks in the last 3 months: no  Demonstrates understanding of importance of adherence: yes  Informant: spouse  Reliability of informant: reliable  Provider-estimated medication adherence level: good  Patient is at risk for Non-Adherence: No  Reasons for non-adherence: no problems identified Were doses missed due to medication being on hold? No    [name] 100 mg/mL [name] : 0 days of medicine on hand       REFERRAL TO PHARMACIST     Referral to the pharmacist: Not needed      [facility]     Shipping address confirmed in Epic.     Patient was notified of new phone menu : No    Delivery Scheduled: Yes, Expected medication delivery date: 12/20/22.     Medication will be delivered via UPS to the prescription address in Epic WAM.    [facility]' W Danae Chen Shared [facility] Specialty Technician

## 2022-12-19 DIAGNOSIS — J449 Chronic obstructive pulmonary disease, unspecified: Principal | ICD-10-CM

## 2022-12-19 MED ORDER — AZITHROMYCIN 250 MG TABLET
ORAL_TABLET | Freq: Every day | ORAL | 3 refills | 90 days | Status: CP
Start: 2022-12-19 — End: ?
  Filled 2022-12-23: qty 90, 90d supply, fill #0

## 2022-12-19 MED FILL — SPIRIVA RESPIMAT 2.5 MCG/ACTUATION SOLUTION FOR INHALATION: RESPIRATORY_TRACT | 30 days supply | Qty: 4 | Fill #1

## 2022-12-19 MED FILL — ONDANSETRON 4 MG DISINTEGRATING TABLET: ORAL | 10 days supply | Qty: 30 | Fill #1

## 2022-12-19 MED FILL — NUCALA 100 MG/ML SUBCUTANEOUS AUTO-INJECTOR: SUBCUTANEOUS | 28 days supply | Qty: 1 | Fill #4

## 2023-01-10 MED ORDER — PANTOPRAZOLE 40 MG TABLET,DELAYED RELEASE
ORAL_TABLET | Freq: Two times a day (BID) | ORAL | 3 refills | 30 days | Status: CP
Start: 2023-01-10 — End: ?
  Filled 2023-01-14: qty 60, 30d supply, fill #0

## 2023-01-10 NOTE — Unmapped (Signed)
[facility] Shared [facility] Clinical Assessment & Refill Coordination Note    John Crawford, [place]: 03/09/1956  Phone: There are no phone numbers on file.    All above HIPAA information was verified with patient's family member, wife John Crawford).     Was a Nurse, learning disability used for this call? No    Specialty Medication(s):   CF/Pulmonary/Asthma: [name] 100 mg/mL       Current Outpatient Medications   Medication Sig Dispense Refill    albuterol 2.5 mg /3 mL (0.083 %) nebulizer solution Inhale 3 mL (2.5 mg total) by nebulization Every four (4) hours. 540 mL 11    albuterol HFA 90 mcg/actuation inhaler Inhale 2 puffs every four (4) hours as needed for wheezing. 18 g 11    azithromycin (ZITHROMAX) 250 MG tablet Take 1 tablet (250 mg total) by mouth daily. 90 tablet 3    citalopram (CELEXA) 20 MG tablet Take 1 tablet (20 mg total) by mouth daily. 90 tablet 1    EPINEPHrine (EPIPEN) 0.3 mg/0.3 mL injection Inject 0.3 mL (0.3 mg total) into the muscle as needed for anaphylaxis. For emergency use with [name] 2 each 1    fluticasone propion-salmeterol (ADVAIR HFA) 115-21 mcg/actuation inhaler Inhale 2 puffs by mouth two (2) times a day. 36 g 3    mepolizumab ([name]) 100 mg/mL [name] Inject 100 mg under the skin every twenty-eight (28) days. 1 mL 6    metoPROLOL succinate (TOPROL-XL) 50 MG 24 hr tablet Take 1 tablet (50 mg total) by mouth daily. 90 tablet 2    ondansetron (ZOFRAN-ODT) 4 MG disintegrating tablet Dissolve 1 tablet (4 mg total) on the tongue every eight (8) hours as needed for nausea. 30 tablet 3    predniSONE (DELTASONE) 10 MG tablet Take 1 tablet (10 mg total) by mouth daily. 30 tablet 11    rivaroxaban (XARELTO) 20 mg tablet Take 1 tablet (20 mg total) by mouth daily with evening meal. 90 tablet 1    sodium chloride 7% (PULMOSAL) 7 % Nebu Inhale 4 mL by nebulization two (2) times a day. 240 mL 6    tiotropium bromide (SPIRIVA RESPIMAT) 2.5 mcg/actuation inhalation mist Inhale 2 puffs by mouth daily. 4 g 11     No current facility-administered medications for this visit.        Changes to medications: John Crawford reports no changes at this time.    No Known Allergies    Changes to allergies: No    SPECIALTY MEDICATION ADHERENCE     Nucula 100 mg/mL: 0 days of medicine on hand     Patient reported X missed doses in the last month: 0  Specialty Medication: Nucula  Patient is on additional specialty medications: No  Patient is on more than two specialty medications: No  Any gaps in refill history greater than 2 weeks in the last 3 months: no  Demonstrates understanding of importance of adherence: yes  Informant: spouse  Reliability of informant: reliable     Specialty medication(s) dose(s) confirmed: Regimen is correct and unchanged.     Are there any concerns with adherence? No: Per wife, last John Crawford was scheduled for 3/28 and patient took on 4/3 due to pt preference.    Adherence counseling provided?  Not needed    CLINICAL MANAGEMENT AND INTERVENTION      Clinical Benefit Assessment:    Do you feel the medicine is effective or helping your condition? Yes    Clinical Benefit counseling provided? Not needed  Adverse Effects Assessment:    Are you experiencing any side effects? Yes, patient reports experiencing consistently has a headache after taking Nucula, that lasts about 3 days; also has had some face swelling. . Side effect counseling provided: We discussed taking Tylenlol 30 minutes before administering Nucula then PRN.     Are you experiencing difficulty administering your medicine? No    Quality of Life Assessment:    Quality of Life    Rheumatology  Oncology  Dermatology  Cystic Fibrosis          How many days over the past month did your COPD/asthma  keep you from your normal activities? For example, brushing your teeth or getting up in the morning. Not asked    Have you discussed this with your provider? Not needed    Acute Infection Status:    Acute infections noted within Epic:  No active infections  Patient reported infection: None    Therapy Appropriateness:    Is therapy appropriate and patient progressing towards therapeutic goals? Yes, therapy is appropriate and should be continued    DISEASE/MEDICATION-SPECIFIC INFORMATION      For CF patients: CF [facility] Active? Yes    Asthma and Allergy: Have you had an asthma exacerbation in the last 30 days? No  Have you needed to use your rescue inhaler more often than usual in the last 30 days? No. Baseline taking albuterol x3-4/daily  Have you needed to take steroids for your asthma in the last 30 days? Patient on chronic prednisone 10 mg daily    PATIENT SPECIFIC NEEDS     Does the patient have any physical, cognitive, or cultural barriers? No    Is the patient high risk? No    Did the patient require a clinical intervention? No    Does the patient require physician intervention or other additional services (i.e., nutrition, smoking cessation, social work)? No    SOCIAL DETERMINANTS OF HEALTH     At the [facility], we have learned that life circumstances - like trouble affording food, housing, utilities, or transportation can affect the health of many of our patients.   That is why we wanted to ask: are you currently experiencing any life circumstances that are negatively impacting your health and/or quality of life?   Wife reported concern with cost of medications. Did not express a need for resources at this time.     Social Determinants of Health     Financial Resource Strain: Low Risk  (10/26/2020)    Overall Financial Resource Strain (CARDIA)     Difficulty of Paying Living Expenses: Not hard at all   Internet Connectivity: Not on file   Food Insecurity: No Food Insecurity (12/15/2019)    Hunger Vital Sign     Worried About Running Out of Food in the Last Year: Never true     Ran Out of Food in the Last Year: Never true   Tobacco Use: High Risk (11/08/2022)    Patient History     Smoking Tobacco Use: Every Day     Smokeless Tobacco Use: Never     Passive Exposure: Not on file   Housing/Utilities: Low Risk  (03/21/2020)    Housing/Utilities     Within the past 12 months, have you ever stayed: outside, in a car, in a tent, in an overnight shelter, or temporarily in someone else's home (i.e. couch-surfing)?: No     Are you worried about losing your housing?: No  Within the past 12 months, have you been unable to get utilities (heat, electricity) when it was really needed?: No   Alcohol Use: Not At Risk (11/16/2020)    Alcohol Use     How often do you have a drink containing alcohol?: Never     How many drinks containing alcohol do you have on a typical day when you are drinking?: 1 - 2     How often do you have 5 or more drinks on one occasion?: Never   Transportation Needs: No Transportation Needs (03/21/2020)    PRAPARE - Transportation     Lack of Transportation (Medical): No     Lack of Transportation (Non-Medical): No   Substance Use: Low Risk  (12/04/2020)    Substance Use     Taken prescription drugs for non-medical reasons: Never     Taken illegal drugs: Never     Patient indicated they have taken drugs in the past year for non-medical reasons: Yes, [positive answer(s)]: Not on file   Health Literacy: Not on file   Physical Activity: Not on file   Interpersonal Safety: Not on file   Stress: Not on file   Intimate Partner Violence: Not At Risk (11/06/2020)    Humiliation, Afraid, Rape, and Kick questionnaire     Fear of Current or Ex-Partner: No     Emotionally Abused: No     Physically Abused: No     Sexually Abused: No   Depression: Not at risk (11/08/2022)    PHQ-2     PHQ-2 Score: 0   Social Connections: Not on file       Would you be willing to receive help with any of the needs that you have identified today? No       SHIPPING     Specialty Medication(s) to be Shipped:   CF/Pulmonary/Asthma: [name] 100 mg/mL    Other medication(s) to be shipped: Spiriva Respimat 2.5 mcg/actuation, ondansetron ODT 4 mg, pantoprazole 40 mg     Changes to insurance: No    Patient was informed of new phone menu: No    Delivery Scheduled: Yes, Expected medication delivery date: 01/15/23.     Medication will be delivered via UPS to the confirmed prescription address [facility].    The patient will receive a drug information handout for each medication shipped and additional FDA Medication Guides as required.  Verified that patient has previously received a Conservation officer, historic buildings and a Surveyor, mining.    The patient or caregiver noted above participated in the development of this care plan and knows that they can request review of or adjustments to the care plan at any time.      All of the patient's questions and concerns have been addressed.    [name], [facility] Shared [facility] Specialty Pharmacist   Documented by Laurence Compton, PharmD Candidate

## 2023-01-14 MED FILL — SPIRIVA RESPIMAT 2.5 MCG/ACTUATION SOLUTION FOR INHALATION: RESPIRATORY_TRACT | 30 days supply | Qty: 4 | Fill #2

## 2023-01-14 MED FILL — ONDANSETRON 4 MG DISINTEGRATING TABLET: ORAL | 10 days supply | Qty: 30 | Fill #2

## 2023-01-14 MED FILL — NUCALA 100 MG/ML SUBCUTANEOUS AUTO-INJECTOR: SUBCUTANEOUS | 28 days supply | Qty: 1 | Fill #5

## 2023-01-24 NOTE — Unmapped (Signed)
Prior authorization submitted for Fluticasone-Salmeterol 115-21MCG/ACT aerosol.      (Key: [id number])     Oley Balm

## 2023-01-28 NOTE — Unmapped (Signed)
Prior authorization submitted for Fluticasone-Salmeterol 115-21MCG/ACT aerosol has been approved from 01/21/2023 until further notice. See media tab for letter.    Oley Balm

## 2023-02-11 NOTE — Unmapped (Signed)
[facility] Refill Coordination Note    Specialty Medication(s) to be Shipped:   CF/Pulmonary/Asthma: [name] 100mg /ml    Other medication(s) to be shipped:  ondansetron, pantoprazole, Spiriva Respimat     John Crawford, DOB: 29-Sep-1956  Phone: There are no phone numbers on file.      All above HIPAA information was verified with patient's family member, wife John Crawford.     Was a Nurse, learning disability used for this call? No    Completed refill call assessment today to schedule patient's medication shipment from the [facility] [phone]).  All relevant notes have been reviewed.     Specialty medication(s) and dose(s) confirmed: Regimen is correct and unchanged.   Changes to medications: John Crawford reports no changes at this time.  Changes to insurance: No  New side effects reported not previously addressed with a pharmacist or physician: None reported  Questions for the pharmacist: No    Confirmed patient received a Conservation officer, historic buildings and a Surveyor, mining with first shipment. The patient will receive a drug information handout for each medication shipped and additional FDA Medication Guides as required.       DISEASE/MEDICATION-SPECIFIC INFORMATION        For patients on injectable medications: Patient currently has 0 doses left.  Next injection is scheduled for 02/25/23.    SPECIALTY MEDICATION ADHERENCE     Medication Adherence    Patient reported X missed doses in the last month: 0  Specialty Medication: [name] 100 mg/mL [name]  Patient is on additional specialty medications: No  Patient is on more than two specialty medications: No  Any gaps in refill history greater than 2 weeks in the last 3 months: no  Demonstrates understanding of importance of adherence: yes  Informant: spouse  Reliability of informant: reliable  Provider-estimated medication adherence level: good  Patient is at risk for Non-Adherence: No  Reasons for non-adherence: no problems identified Were doses missed due to medication being on hold? No    [name] 100 mg/mL [name]   : 0 days of medicine on hand       REFERRAL TO PHARMACIST     Referral to the pharmacist: Not needed      [facility]     Shipping address confirmed in Epic.       Delivery Scheduled: Yes, Expected medication delivery date: 02/18/23.     Medication will be delivered via UPS to the prescription address in Epic WAM.    [facility]' W [facility] Specialty Technician

## 2023-02-17 MED FILL — SPIRIVA RESPIMAT 2.5 MCG/ACTUATION SOLUTION FOR INHALATION: RESPIRATORY_TRACT | 30 days supply | Qty: 4 | Fill #3

## 2023-02-17 MED FILL — ONDANSETRON 4 MG DISINTEGRATING TABLET: ORAL | 10 days supply | Qty: 30 | Fill #3

## 2023-02-17 MED FILL — PANTOPRAZOLE 40 MG TABLET,DELAYED RELEASE: ORAL | 30 days supply | Qty: 60 | Fill #1

## 2023-02-17 MED FILL — NUCALA 100 MG/ML SUBCUTANEOUS AUTO-INJECTOR: SUBCUTANEOUS | 28 days supply | Qty: 1 | Fill #6

## 2023-03-13 DIAGNOSIS — J479 Bronchiectasis, uncomplicated: Principal | ICD-10-CM

## 2023-03-13 MED ORDER — NUCALA 100 MG/ML SUBCUTANEOUS AUTO-INJECTOR
SUBCUTANEOUS | 6 refills | 28 days | Status: CP
Start: 2023-03-13 — End: ?
  Filled 2023-03-19: qty 1, 28d supply, fill #0

## 2023-03-17 MED ORDER — PREDNISONE 10 MG TABLET
ORAL_TABLET | Freq: Every day | ORAL | 11 refills | 30 days
Start: 2023-03-17 — End: ?

## 2023-03-17 MED ORDER — FLUTICASONE PROPIONATE 115 MCG-SALMETEROL 21 MCG/ACTUATION HFA INHALER
Freq: Two times a day (BID) | RESPIRATORY_TRACT | 3 refills | 90 days
Start: 2023-03-17 — End: 2024-03-16

## 2023-03-17 MED ORDER — ONDANSETRON 4 MG DISINTEGRATING TABLET
ORAL_TABLET | Freq: Three times a day (TID) | ORAL | 3 refills | 10 days | PRN
Start: 2023-03-17 — End: ?

## 2023-03-17 MED ORDER — ALBUTEROL SULFATE HFA 90 MCG/ACTUATION AEROSOL INHALER
RESPIRATORY_TRACT | 11 refills | 17 days | PRN
Start: 2023-03-17 — End: ?

## 2023-03-17 MED ORDER — ALBUTEROL SULFATE 2.5 MG/3 ML (0.083 %) SOLUTION FOR NEBULIZATION
RESPIRATORY_TRACT | 11 refills | 30 days
Start: 2023-03-17 — End: 2024-03-16

## 2023-03-18 MED ORDER — PREDNISONE 10 MG TABLET
ORAL_TABLET | Freq: Every day | ORAL | 11 refills | 30 days
Start: 2023-03-18 — End: ?

## 2023-03-18 MED ORDER — FLUTICASONE PROPIONATE 115 MCG-SALMETEROL 21 MCG/ACTUATION HFA INHALER
Freq: Two times a day (BID) | RESPIRATORY_TRACT | 3 refills | 90 days
Start: 2023-03-18 — End: 2024-03-17

## 2023-03-18 MED ORDER — ONDANSETRON 4 MG DISINTEGRATING TABLET
ORAL_TABLET | Freq: Three times a day (TID) | ORAL | 3 refills | 10 days | Status: CP | PRN
Start: 2023-03-18 — End: ?
  Filled 2023-03-19: qty 30, 10d supply, fill #0

## 2023-03-18 MED ORDER — ALBUTEROL SULFATE 2.5 MG/3 ML (0.083 %) SOLUTION FOR NEBULIZATION
RESPIRATORY_TRACT | 11 refills | 30 days | Status: CP
Start: 2023-03-18 — End: 2024-03-17
  Filled 2023-03-19: qty 540, 30d supply, fill #0

## 2023-03-18 MED ORDER — ALBUTEROL SULFATE HFA 90 MCG/ACTUATION AEROSOL INHALER
RESPIRATORY_TRACT | 11 refills | 17 days | PRN
Start: 2023-03-18 — End: ?

## 2023-03-18 NOTE — Unmapped (Signed)
[facility] Pharm[facility] Refill Coordination Note    Specialty Medication(s) to be Shipped:   CF/Pulmonary/Asthma: [name] 100 mg/mL    Other medication(s) to be shipped:  albuterol 2.5 mg/44mL, azithromycin 250mg , epi-pen, ondansetron 4 mg, pantoprazole 40mg  and Spiriva 2.5 mcg/act     John Crawford, DOB: 1955-12-25  Phone: There are no phone numbers on file.      All above HIPAA information was verified with patient's family member, wife John Crawford).     Was a Nurse, learning disability used for this call? No    Completed refill call assessment today to schedule patient's medication shipment from the [facility] [phone]).  All relevant notes have been reviewed.     Specialty medication(s) and dose(s) confirmed: Regimen is correct and unchanged.   Changes to medications: John Crawford reports no changes at this time.  Changes to insurance: No  New side effects reported not previously addressed with a pharmacist or physician: None reported  Questions for the pharmacist: No    Confirmed patient received a Conservation officer, historic buildings and a Surveyor, mining with first shipment. The patient will receive a drug information handout for each medication shipped and additional FDA Medication Guides as required.       DISEASE/MEDICATION-SPECIFIC INFORMATION        For patients on injectable medications: Patient currently has 0 doses left.  Next injection is scheduled for ~6/18.    SPECIALTY MEDICATION ADHERENCE     Medication Adherence    Patient reported X missed doses in the last month: 0  Specialty Medication: [name] 100 mg/mL [name]  Patient is on additional specialty medications: No  Patient is on more than two specialty medications: No  Any gaps in refill history greater than 2 weeks in the last 3 months: no  Demonstrates understanding of importance of adherence: yes  Informant: spouse          Were doses missed due to medication being on hold? No    [name] 100 mg/ml: 0 days of medicine on hand     REFERRAL TO PHARMACIST     Referral to the pharmacist: Not needed      [facility]     Shipping address confirmed in Epic.       Delivery Scheduled: Yes, Expected medication delivery date: 03/20/23.     Medication will be delivered via UPS to the prescription address in Epic WAM.    John Crawford, PharmD   [facility] Specialty Pharmacist

## 2023-03-18 NOTE — Unmapped (Signed)
Requested Prescriptions     Pending Prescriptions Disp Refills    albuterol 2.5 mg /3 mL (0.083 %) nebulizer solution 540 mL 11     Sig: Inhale 3 mL (2.5 mg total) by nebulization Every four (4) hours.    ondansetron (ZOFRAN-ODT) 4 MG disintegrating tablet 30 tablet 3     Sig: Dissolve 1 tablet (4 mg total) on the tongue every eight (8) hours as needed for nausea.

## 2023-03-19 MED FILL — PANTOPRAZOLE 40 MG TABLET,DELAYED RELEASE: ORAL | 30 days supply | Qty: 60 | Fill #2

## 2023-03-19 MED FILL — SPIRIVA RESPIMAT 2.5 MCG/ACTUATION SOLUTION FOR INHALATION: RESPIRATORY_TRACT | 30 days supply | Qty: 4 | Fill #4

## 2023-03-19 MED FILL — AZITHROMYCIN 250 MG TABLET: ORAL | 90 days supply | Qty: 90 | Fill #1

## 2023-03-19 MED FILL — EPINEPHRINE 0.3 MG/0.3 ML INJECTION, AUTO-INJECTOR: INTRAMUSCULAR | 2 days supply | Qty: 2 | Fill #1

## 2023-04-09 NOTE — Unmapped (Signed)
[name] Refill Coordination Note    Specialty Medication(s) to be Shipped:   CF/Pulmonary/Asthma: [name] 100 mg/mL    Other medication(s) to be shipped:  ondansetron 4mg , pantoprazole 40mg , Spiriva Respimat 2.5 mcg      John Crawford, DOB: Dec 27, 1955  Phone: There are no phone numbers on file.      All above HIPAA information was verified with patient's family member, spouse John Crawford).     Was a Nurse, learning disability used for this call? No    Completed refill call assessment today to schedule patient's medication shipment from the [facility] [phone]).  All relevant notes have been reviewed.     Specialty medication(s) and dose(s) confirmed: Regimen is correct and unchanged.   Changes to medications: [name] reports no changes at this time.  Changes to insurance: No  New side effects reported not previously addressed with a pharmacist or physician: None reported  Questions for the pharmacist: No    Confirmed patient received a Conservation officer, historic buildings and a Surveyor, mining with first shipment. The patient will receive a drug information handout for each medication shipped and additional FDA Medication Guides as required.       DISEASE/MEDICATION-SPECIFIC INFORMATION        For patients on injectable medications: Patient currently has 0 doses left.  Next injection is scheduled for ~7/16.    SPECIALTY MEDICATION ADHERENCE     Medication Adherence    Patient reported X missed doses in the last month: 0  Specialty Medication: [name] 100 mg/mL Q28d  Patient is on additional specialty medications: No  Patient is on more than two specialty medications: No  Any gaps in refill history greater than 2 weeks in the last 3 months: no  Demonstrates understanding of importance of adherence: yes  Informant: spouse          Were doses missed due to medication being on hold? No    [name] 100 mg/ml: 0 days of medicine on hand     REFERRAL TO PHARMACIST     Referral to the pharmacist: Not needed      [facility]     Shipping address confirmed in Epic.       Delivery Scheduled: Yes, Expected medication delivery date: 04/16/23.     Medication will be delivered via UPS to the prescription address in Epic WAM.    [name], PharmD   [facility] Specialty Pharmacist

## 2023-04-15 MED FILL — NUCALA 100 MG/ML SUBCUTANEOUS AUTO-INJECTOR: SUBCUTANEOUS | 28 days supply | Qty: 1 | Fill #1

## 2023-04-15 MED FILL — SPIRIVA RESPIMAT 2.5 MCG/ACTUATION SOLUTION FOR INHALATION: RESPIRATORY_TRACT | 30 days supply | Qty: 4 | Fill #5

## 2023-04-15 MED FILL — ONDANSETRON 4 MG DISINTEGRATING TABLET: ORAL | 10 days supply | Qty: 30 | Fill #1

## 2023-04-15 MED FILL — PANTOPRAZOLE 40 MG TABLET,DELAYED RELEASE: ORAL | 30 days supply | Qty: 60 | Fill #3

## 2023-05-04 MED ORDER — CITALOPRAM 20 MG TABLET
ORAL_TABLET | Freq: Every day | ORAL | 0 refills | 0.00000 days
Start: 2023-05-04 — End: 2023-10-31

## 2023-05-05 MED ORDER — CITALOPRAM 20 MG TABLET
ORAL_TABLET | Freq: Every day | ORAL | 1 refills | 90 days
Start: 2023-05-05 — End: 2023-11-01

## 2023-05-05 NOTE — Unmapped (Signed)
[facility] Refill Coordination Note    Specialty Medication(s) to be Shipped:   CF/Pulmonary/Asthma: [name] 100    Other medication(s) to be shipped: No additional medications requested for fill at this time     John Crawford, DOB: Oct 16, 1955  Phone: There are no phone numbers on file.      All above HIPAA information was verified with patient's family member, John Crawford.     Was a Nurse, learning disability used for this call? No    Completed refill call assessment today to schedule patient's medication shipment from the [facility] [phone]).  All relevant notes have been reviewed.     Specialty medication(s) and dose(s) confirmed: Regimen is correct and unchanged.   Changes to medications: John Crawford reports no changes at this time.  Changes to insurance: No  New side effects reported not previously addressed with a pharmacist or physician: None reported  Questions for the pharmacist: No    Confirmed patient received a Conservation officer, historic buildings and a Surveyor, mining with first shipment. The patient will receive a drug information handout for each medication shipped and additional FDA Medication Guides as required.       DISEASE/MEDICATION-SPECIFIC INFORMATION        For patients on injectable medications: Patient currently has 0 doses left.  Next injection is scheduled for 05/09/23.    SPECIALTY MEDICATION ADHERENCE     Medication Adherence    Patient reported X missed doses in the last month: 0  Specialty Medication: [name] 100 mg/mL [name]  Patient is on additional specialty medications: No  Patient is on more than two specialty medications: No  Any gaps in refill history greater than 2 weeks in the last 3 months: no  Demonstrates understanding of importance of adherence: yes              Were doses missed due to medication being on hold? No    [name] 100   mg/ml: 0 days of medicine on hand       REFERRAL TO PHARMACIST     Referral to the pharmacist: Not needed      [facility] Shipping address confirmed in Epic.       Delivery Scheduled: Yes, Expected medication delivery date: 05/07/23.     Medication will be delivered via UPS to the prescription address in Epic WAM.    John Crawford   [facility] Specialty Technician

## 2023-05-05 NOTE — Unmapped (Signed)
Spoke with wife. Added Spriva to delivery scheduled for 7/31. NO questions for the pharmacist.

## 2023-05-06 MED ORDER — CITALOPRAM 20 MG TABLET
ORAL_TABLET | Freq: Every day | ORAL | 1 refills | 90.00000 days
Start: 2023-05-06 — End: 2023-11-02

## 2023-05-06 MED FILL — NUCALA 100 MG/ML SUBCUTANEOUS AUTO-INJECTOR: SUBCUTANEOUS | 28 days supply | Qty: 1 | Fill #2

## 2023-05-06 MED FILL — SPIRIVA RESPIMAT 2.5 MCG/ACTUATION SOLUTION FOR INHALATION: RESPIRATORY_TRACT | 30 days supply | Qty: 4 | Fill #6

## 2023-05-13 MED ORDER — CITALOPRAM 20 MG TABLET
ORAL_TABLET | Freq: Every day | ORAL | 1 refills | 90 days | Status: CP
Start: 2023-05-13 — End: 2023-11-09

## 2023-05-13 NOTE — Unmapped (Signed)
Celexa refilled.

## 2023-05-25 NOTE — Unmapped (Signed)
Pulmonary Clinic - Follow-Up Visit    Referring Physician :  None Per Patient Pcp  PCP:     Pcp, None Per Patient  Reason for Consult:   COPD     HISTORY:     History of Present Illness:  Initial Hx   John Crawford is a 67 y.o. male with a history of A fib/flutter, hx of HFrEF, and COPD whom we are seeing for COPD evaluation and management. Established with our clinic 06/2020. At that time pt reported COPD dx ~2018 with course complicated by persistent SOB and recurrent infections/flares requiring hospitalization. Sx include yellow/green sputum production in AM, persistent cough, DOE. Prescribed oxygen at night following one of his hospitalizations which he continues to use. Active smoker at that time though reduced from prior (1 cig/day; up to 2-3 ppd at one point, started age 67).     Early on, palliative was discussed in the context of DOE that was activity limiting as well and recurrent hospitalizations. Ultimately, pt felt he was not ready for hospice. Per prior documentation, He does want to avoid repeated hospitalization but also reports wanting to be able to present to the ED if he is having a flare givent that it may be triggered by something treatable like a viral infection or PNA. He states that he has more good days than bad and that overall he still spends a significant amount of his time doing activities he enjoys and spending time with his wife. While in our care, he his Symbicort was increased and daily prednisone and azithromycin was added. With these interventions pt felt much better and hospital admissions decreased. Started on Mepolizumab at some point; some HAs associated with the injections but overall well tolerated. Continues to use O2 at night and intermittently during the day.  Also uses airway clearance.    Interval Hx 03/11/2022  John Crawford has been feeling the best he has in about a year. His O2 has been about 92% at home off O2. His cough is controlled. No worsening shortness of breath. No HA/sinus pressure. Has his O2 with him today but is not wearing it with SpO2 91%.     Interval Hx 05/26/23  Pt continues to feel well. Attributes this to use of [name]. Off home oxygen during the day, continues to use 2.5 L at night. Awaiting scheduling of CPAP titration study. Has not received call from sleep clinic. Has run out of pulmonary medications as well as PCP medications (citalopram, xarelto, etc). Has not seen a PCP since his PCP completed residency in June. Continues to smoke. Is not interested in quitting and became upset when describing association between smoking and COPD. Feels other environmental factors contributed to his COPD. Later apologized for behavior.     Past Medi/cal History:  Past Medical History:   Diagnosis Date    Atrial flutter (CMS-HCC)     Bronchiectasis without complication (CMS-HCC) 08/17/2020    COPD (chronic obstructive pulmonary disease) (CMS-HCC)     Tobacco abuse      Past Surgical History:   Procedure Laterality Date    PR COMPRE EP EVAL ABLTJ 3D MAPG TX SVT N/A 12/15/2019    Procedure: A-Flutter Ablation;  Surgeon: Webb Silversmith, MD;  Location: [facility] EP;  Service: Cardiology     Other History:  The social history and family history were personally reviewed and updated in the patient's electronic medical record.    Family History   Problem Relation Age of Onset  Colon cancer Mother     Heart attack Father     Prostate cancer Father      Social History     Socioeconomic History    Marital status: Married   Occupational History    Occupation: Curator    Tobacco Use    Smoking status: Every Day     Current packs/day: 1.00     Types: Cigarettes    Smokeless tobacco: Never    Tobacco comments:     started smoking at 17, 2ppd for 35 years, currently smoking 1ppdd   Vaping Use    Vaping status: Never Used   Substance and Sexual Activity    Alcohol use: Yes     Comment: 1 to 2 cans of beer daily    Drug use: Never    Sexual activity: Yes     Social Determinants of Health Financial Resource Strain: Low Risk  (10/26/2020)    Overall Financial Resource Strain (CARDIA)     Difficulty of Paying Living Expenses: Not hard at all   Food Insecurity: No Food Insecurity (12/15/2019)    Hunger Vital Sign     Worried About Running Out of Food in the Last Year: Never true     Ran Out of Food in the Last Year: Never true   Transportation Needs: No Transportation Needs (03/21/2020)    PRAPARE - Therapist, art (Medical): No     Lack of Transportation (Non-Medical): No       Home Medications:  Current Outpatient Medications on File Prior to Visit   Medication Sig Dispense Refill    citalopram (CELEXA) 20 MG tablet Take 1 tablet (20 mg total) by mouth daily. 90 tablet 1    EPINEPHrine (EPIPEN) 0.3 mg/0.3 mL injection Inject 0.3 mL (0.3 mg total) into the muscle as needed for anaphylaxis. For emergency use with [name] 2 each 1    fluticasone propion-salmeterol (ADVAIR HFA) 115-21 mcg/actuation inhaler Inhale 2 puffs by mouth two (2) times a day. 36 g 3    metoPROLOL succinate (TOPROL-XL) 50 MG 24 hr tablet Take 1 tablet (50 mg total) by mouth daily. 90 tablet 2    predniSONE (DELTASONE) 10 MG tablet Take 1 tablet (10 mg total) by mouth daily. 30 tablet 11    sodium chloride 7% (PULMOSAL) 7 % Nebu Inhale 4 mL by nebulization two (2) times a day. 240 mL 6     No current facility-administered medications on file prior to visit.       Allergies:  Allergies as of 05/26/2023    (No Known Allergies)       Review of Systems:  A comprehensive review of systems was completed and negative except as noted in HPI.    PHYSICAL EXAM:     Vitals:    05/26/23 1330   BP: 110/56   Pulse: 65   Temp: 36.4 ??C (97.6 ??F)   SpO2: 94%      11/2021: Placed on 2L oxygen with improvement to 95%. Desat to 83% on room air at 11/2021 appt.     General: Alert and oriented, no acute distress  HEENT: MMM, clear oropharynx  CV: RRR, no m/r/g  Lungs: non-labored breathing, wet cough   Abd: ND  Ext: Warm, well perfused, no peripheral edema  Skin: No rashes    LABORATORY and RADIOLOGY DATA:     Pulmonary Function Tests/Interpretation:  08/13/21: Obstructive pattern with restrictive features likely reflective of gas trapping  :  01/22/21: with 2L O2 requirement, previously without O2 need.   12/05/21: w/o O2 requirement, 20 sec pause during    STOP BANG 5; CPAP titration study ordered; awaiting scheduling     Pertinent Laboratory Data:  WBC 8.5  IgG subclasses and IgA, IgM, IgE WNL    Pertinent Imaging Data:  03/11/22 Low Dose CT: Cystic bronchiectasis, bronchial wall thickening, tree-in-bud opacities and impacted airways in bilateral lung. Secretions in bilateral main bronchus. Resolution of 0.6 cm nodule in the right upper lobe. No suspicious pulmonary nodules.       ASSESSMENT and PLAN   John Crawford is a 67 y.o. male with a history of A fib/flutter, hx of HFrEF,  COPD, and bronchectasis whom we are seeing in consultation requested by Terri Piedra for evaluation of COPD. However, pt with screening CT that shows a new diagnosis of bronchiectasis as well 08/2020. In early 2022 John Crawford has had repeated exacerbations prompting palliative care consult by PCP. However, after much discussion, John Crawford feels that he has more good days than bad and has a reasonable quality of life on his current medication regimen. He understands that the therapies he's on will not prolong his life but is open to trying therapies that may help with his sx.     Pt initially presumed to have COPD with > 3 exacerbation requiring hospitalization while on triple therapy and multiple more in the OP setting. Teaching on inhaler use has been performed in clinic and patient was started on azithromycin at initial apt due to exacerbations. (Roflumilast is likely cost prohibitive for him). Azithro was briefly paused due to concern for MAC and desire to avoid resistance but AFB culture NEG so restarted given frequent exacerbations. Given his end stage disease and significant improvement with prednsione, Dr. Marlana Salvage started John Crawford on prednisone 10mg  daily. Later started on [name] given elevated Eos and improvement with bronchodilator (<200 mL). Experiences nausea with injections which is controlled with Zofran. With use of [name], pt felt sx improved significantly.     Bronchiectasis noted on screening CT 11/10. This was a new dx for him and while he was hospitalized for a COPD exacerbation an [place] was provided to the patient and workup for etiology of bronchiectasis was initiated. So far, WBC and IgG levels WNL. Alpha one MM (WNL). Sputum cx, fungal, and AFB cx have been negative. Prior provider suspected that many of patients COPD flares have actually been 2/2 his bronchiectasis.     COPD   - Last hospitalization 2022, last steroid burst 08/2021.  - Continue on 10mg  prednisone daily   - Continue on Symbicort BID, Spiriva, albuterol PRN    - Azithromycin daily   - Has albuterol nebs at home to use PRN   - Nursing education on using a spacer at initial apt   - Continue Mepolizumab   - Nausea and HA with injection: Recc zofran PRN and tylenol   - If sx continue, will consider d/c in the future or transitioning to an alternative agent   - Ordered pulmonary rehab previously, unable to complete due to location. Provided with online resource.   - with 2L [place] supplemental oxygen requirement 01/22/21. Desat on RA 11/2021. Currently without need for oxygen.   - [employer] O2 for use at night  - STOP BANG 5; CPAP titration studied ordered but not yet scheduled. Provided number for scheduling  - Prior ABG without hypercarbia but does suggest hypoxia   -  FVL at next appt     Bronchiectasis   - Upper lobe predominant but diffuse bronchiectasis, no prior imaging for comparison   - IgG subclasses and IgA, IgM, IgE WNL  - ABPA panel with IgE 79pt   - Alpha 1 WNL  - Sputum culture, fungal, and AFB 12/21 and 1/22 NEG   -  Aerobika BID with albuterol & HTS 7%  - Continues to have significant reflux sx, due to patient having severe lung disease prefer to avoid invasive procedures. For this reason, PPI prev increased to BID. If continues to have significant sx, will refer to GI. Advised to stop taking Zofran for reflux sx.     Tobacco Cessation   - Pt not interested in cessation   - Lung Ca CT 9/21 with 6mm nodule Lung-RADS 3S, had previously held off on repeat imaging due to limited life-expectancy with repeated ED visits and referral to palliative. However, he is currently having more good days than bad and is overall in a better place on his current regimen will resume screening as patient is willing to consider workup and therapy based on findings.   - LDCT ordered     Daytime Fatigue    - STOP BANG 5  - Sleep study with OSA, at that time 1L O2 requirement at night   - Pt using 2.5L at night with desaturations when he wakes up, asked to increase to 4L  - Patient is to call and schedule CPAP titration study, will FU O2 requirement with this study as well     Lung nodule  Lung cancer screening CT (06/2020) with 6mm nodule Lung-RADS 3S, previously held off on repeat imaging due to limited life-expectancy with repeated ED visits. After clinical improvement patient was restarted on screening. Last CT (03/2022) with Lung-RADS 1.  -Due for low dose CT, ordered today    Influenza: 08/13/21  Prenvar:  08/28/20. PCV 2/20  Lung cancer screening: See above   Covid-19: 05/24/2020, 06/19/20, 07/19/2020, consider bivalent at next appt     Immunization History   Administered Date(s) Administered    COVID-19 VACCINE,MRNA(MODERNA)(PF) 05/24/2020, 06/19/2020, 07/19/2020    Influenza Vaccine Quad(IM)6 MO-Adult(PF) 12/16/2019, 06/19/2020, 08/13/2021, 11/08/2022    Pneumococcal Conjugate 13-Valent 08/28/2020    Pneumococcal Conjugate 20-valent 11/26/2021    TD(TDVAX),ADSORBED,2LF(IM)(PF) 03/27/2010    TdaP 03/23/2018       Patient will return to clinic in 6 months or sooner if needed.    This patient was seen and discussed with attending physician, Dr. Nada Libman who agrees with the assessment and plan above.     UY:QIHK Per Patient Pcp, Pcp, None Per Patient    Benjaman Kindler, MD

## 2023-05-26 ENCOUNTER — Ambulatory Visit: Admit: 2023-05-26 | Discharge: 2023-05-27 | Payer: MEDICARE

## 2023-05-26 DIAGNOSIS — Z87891 Personal history of nicotine dependence: Principal | ICD-10-CM

## 2023-05-26 DIAGNOSIS — F17200 Nicotine dependence, unspecified, uncomplicated: Principal | ICD-10-CM

## 2023-05-26 DIAGNOSIS — R110 Nausea: Principal | ICD-10-CM

## 2023-05-26 DIAGNOSIS — J449 Chronic obstructive pulmonary disease, unspecified: Principal | ICD-10-CM

## 2023-05-26 DIAGNOSIS — J479 Bronchiectasis, uncomplicated: Principal | ICD-10-CM

## 2023-05-26 MED ORDER — RIVAROXABAN 20 MG TABLET
mg | ORAL | 0 refills | 90 days | Status: CP
  Filled 2023-05-29: qty 90, 90d supply, fill #0

## 2023-05-26 MED ORDER — ALBUTEROL SULFATE HFA 90 MCG/ACTUATION AEROSOL INHALER: puff | RESPIRATORY_TRACT | 11 refills | 17 days | Status: CP

## 2023-05-26 MED ORDER — ALBUTEROL SULFATE 2.5 MG/3 ML (0.083 %) SOLUTION FOR NEBULIZATION
mg | RESPIRATORY_TRACT | 11 refills | 30 days | Status: CP
  Filled 2023-05-29: qty 18, 17d supply, fill #0

## 2023-05-26 MED ORDER — SPIRIVA RESPIMAT 2.5 MCG/ACTUATION SOLUTION FOR INHALATION
puff | RESPIRATORY_TRACT | 11 refills | 30 days | Status: CP
  Filled 2023-05-29: qty 4, 30d supply, fill #0

## 2023-05-26 MED ORDER — PANTOPRAZOLE 40 MG TABLET,DELAYED RELEASE
mg | ORAL | 0 refills | 30 days | Status: CP
  Filled 2023-05-26: qty 60, 30d supply, fill #0

## 2023-05-26 MED ORDER — NUCALA 100 MG/ML SUBCUTANEOUS AUTO-INJECTOR
mg | SUBCUTANEOUS | 6 refills | 28 days | Status: CP
  Filled 2023-05-29: qty 1, 28d supply, fill #0

## 2023-05-26 MED ORDER — AZITHROMYCIN 250 MG TABLET
mg | ORAL | 3 refills | 90 days | Status: CP
  Filled 2023-05-29: qty 90, 90d supply, fill #0

## 2023-05-26 MED ORDER — ONDANSETRON 4 MG DISINTEGRATING TABLET
mg | ORAL | 0 refills | 10 days | Status: CP
  Filled 2023-05-26: qty 30, 10d supply, fill #0

## 2023-05-26 NOTE — Unmapped (Addendum)
It was a pleasure seeing you in clinic today! You were seen in clinic today by Dr. Janee Morn & Dr. Nada Libman    Here's a summary of what we discussed during your visit:  Refilling your medications  I am going to call regarding your CPAP; I also encourage you to call back them at [phone]   Ordered PFTs to be completed prior to your next visit  I ordered your yearly lung cancer screening; someone will call you to schedule   You need follow-up with your PCP; call [phone] to schedule with [facility]  Keep up the good work with taking your medications and using your inhalers    Follow up with me in 6 months!    Benjaman Kindler, MD  Pulmonary & Critical Care Fellow  [facility]  Phone: [phone]  Fax: [phone]  ______________________________________________________________________________________________________________________________  To contact your care team, you can either send a message via MyChart or contact the clinic at [phone].    How do I request medication refills?  Request a refill via [facility] (patient portal), call clinic at [phone] or have your pharmacy fax the request to [phone].    [facility]: [phone] *Pharmacy can mail medications to your home. You must call to request the medication be mailed.[facility]: [phone]  [facility]: [phone]    Here are some things you should know about contacting the [facility]:  Please be advised Epic now releases test results to MyChart as soon as they are available which means you will see your test results before I do.    The best way to reach your doctor for non-urgent matters is through MyChart. I can usually respond within two to three business day but I do not check messages after hours (evenings, weekends, and holidays) and often have other duties (inpatient hospital work, Producer, television/film/video activities, teaching) that make me unavailable.    - If you have sent a MyChart message to the clinic and have not received a response after three business days, please call our clinic at [phone] to speak to a nurse.     If you have an urgent issue that you feel needs a response the same day, you should also contact your primary care provider or be evaluated at an Urgent Care clinic.    For urgent lung issues after hours, you can call the hospital operator [phone]) and ask for the Pulmonary Fellow On Call. This doctor can provide some guidance and will send a message to your regular lung doctor the next morning.    If there is an emergency, call 911 or go to your closest emergency room.    I don't have a MyChart. Why should I get one?   - It's encrypted, so your information is secure  - It's a quick, easy way to contact the care team, manage appointments, see test results, and more!    How do I sign-up for MyChart?   - Download the MyChart app from the [employer] or [employer] and sign-up in the app  - Sign-up online at [email or web address]

## 2023-05-27 MED ORDER — PREDNISONE 10 MG TABLET
ORAL_TABLET | ORAL | 0 refills | 11 days
Start: 2023-05-27 — End: 2023-06-06

## 2023-05-27 NOTE — Unmapped (Signed)
Patient has not been under my care for > 2 years. Will need to request from physician currently caring for him.

## 2023-05-27 NOTE — Unmapped (Signed)
[facility] Refill Coordination Note    Specialty Medication(s) to be Shipped:   CF/Pulmonary/Asthma: [name] 100 mg/mL [name]    Other medication(s) to be shipped:  Xarelto ,prednisone, Spiriva Respimat, Ventolin HFA, azithromycin      John Crawford, DOB: 09-03-1956  Phone: There are no phone numbers on file.      All above HIPAA information was verified with patient's family member, wife.     Was a Nurse, learning disability used for this call? No    Completed refill call assessment today to schedule patient's medication shipment from the [facility] [phone]).  All relevant notes have been reviewed.     Specialty medication(s) and dose(s) confirmed: Regimen is correct and unchanged.   Changes to medications: John Crawford reports no changes at this time.  Changes to insurance: No  New side effects reported not previously addressed with a pharmacist or physician: None reported  Questions for the pharmacist: No    Confirmed patient received a Conservation officer, historic buildings and a Surveyor, mining with first shipment. The patient will receive a drug information handout for each medication shipped and additional FDA Medication Guides as required.       DISEASE/MEDICATION-SPECIFIC INFORMATION        N/A    SPECIALTY MEDICATION ADHERENCE     Medication Adherence    Patient reported X missed doses in the last month: 0  Specialty Medication: [name] 100 mg/mL [name]  Patient is on additional specialty medications: No  Patient is on more than two specialty medications: No  Any gaps in refill history greater than 2 weeks in the last 3 months: no  Demonstrates understanding of importance of adherence: yes  Informant: spouse  Reliability of informant: reliable  Provider-estimated medication adherence level: good  Patient is at risk for Non-Adherence: No  Reasons for non-adherence: no problems identified              Were doses missed due to medication being on hold? No    [name] 100 mg/mL [name]  : 0 days of medicine on hand       REFERRAL TO PHARMACIST     Referral to the pharmacist: Not needed      [facility]     Shipping address confirmed in Epic.       Delivery Scheduled: Yes, Expected medication delivery date: 05/30/23.     Medication will be delivered via UPS to the prescription address in Epic WAM.    Sydney Azure' [facility] Specialty Technician

## 2023-06-18 DIAGNOSIS — R110 Nausea: Principal | ICD-10-CM

## 2023-06-18 MED ORDER — PANTOPRAZOLE 40 MG TABLET,DELAYED RELEASE
ORAL_TABLET | Freq: Two times a day (BID) | ORAL | 0 refills | 30 days
Start: 2023-06-18 — End: ?

## 2023-06-18 MED ORDER — ONDANSETRON 4 MG DISINTEGRATING TABLET
ORAL_TABLET | Freq: Three times a day (TID) | ORAL | 0 refills | 10 days | PRN
Start: 2023-06-18 — End: ?

## 2023-06-18 NOTE — Unmapped (Signed)
[facility] Refill Coordination Note    Specialty Medication(s) to be Shipped:   CF/Pulmonary/Asthma: 100mg /ml    Other medication(s) to be shipped:  Spiriva Respimat     John Crawford, DOB: [birth year]  Phone: There are no phone numbers on file.      All above HIPAA information was verified with patient's family member, wife.     Was a Nurse, learning disability used for this call? No    Completed refill call assessment today to schedule patient's medication shipment from the [facility] [phone]).  All relevant notes have been reviewed.     Specialty medication(s) and dose(s) confirmed: Regimen is correct and unchanged.   Changes to medications: Deaton reports no changes at this time.  Changes to insurance: No  New side effects reported not previously addressed with a pharmacist or physician: None reported  Questions for the pharmacist: No    Confirmed patient received a Conservation officer, historic buildings and a Surveyor, mining with first shipment. The patient will receive a drug information handout for each medication shipped and additional FDA Medication Guides as required.       DISEASE/MEDICATION-SPECIFIC INFORMATION        For patients on injectable medications: Patient currently has 0 doses left.  Next injection is scheduled for 06/29/23.    SPECIALTY MEDICATION ADHERENCE     Medication Adherence    Specialty Medication: [name] 100 mg/mL [name]              Were doses missed due to medication being on hold? No    [name] 100 mg/mL [name]   : 0 doses of medicine on hand       REFERRAL TO PHARMACIST     Referral to the pharmacist: Not needed      [facility]     Shipping address confirmed in Epic.       Delivery Scheduled: Yes, Expected medication delivery date: 06/25/23.     Medication will be delivered via UPS to the prescription address in Epic [place].    Jaylan Duggar' W Danae Chen [facility] Specialty Technician

## 2023-06-25 MED FILL — NUCALA 100 MG/ML SUBCUTANEOUS AUTO-INJECTOR: SUBCUTANEOUS | 28 days supply | Qty: 1 | Fill #1

## 2023-06-25 MED FILL — VENTOLIN HFA 90 MCG/ACTUATION AEROSOL INHALER: RESPIRATORY_TRACT | 17 days supply | Qty: 18 | Fill #1

## 2023-06-25 MED FILL — SPIRIVA RESPIMAT 2.5 MCG/ACTUATION SOLUTION FOR INHALATION: RESPIRATORY_TRACT | 30 days supply | Qty: 4 | Fill #1

## 2023-07-07 DIAGNOSIS — R110 Nausea: Principal | ICD-10-CM

## 2023-07-07 MED ORDER — PANTOPRAZOLE 40 MG TABLET,DELAYED RELEASE
ORAL_TABLET | Freq: Two times a day (BID) | ORAL | 0 refills | 30 days
Start: 2023-07-07 — End: ?

## 2023-07-07 MED ORDER — ONDANSETRON 4 MG DISINTEGRATING TABLET
ORAL_TABLET | Freq: Three times a day (TID) | ORAL | 0 refills | 10 days | PRN
Start: 2023-07-07 — End: ?

## 2023-07-08 MED ORDER — ONDANSETRON 4 MG DISINTEGRATING TABLET
ORAL_TABLET | Freq: Three times a day (TID) | ORAL | 0 refills | 10 days | Status: CP | PRN
Start: 2023-07-08 — End: ?
  Filled 2023-07-10: qty 30, 10d supply, fill #0

## 2023-07-08 MED ORDER — PANTOPRAZOLE 40 MG TABLET,DELAYED RELEASE
ORAL_TABLET | Freq: Two times a day (BID) | ORAL | 0 refills | 30 days | Status: CP
Start: 2023-07-08 — End: ?
  Filled 2023-07-10: qty 60, 30d supply, fill #0

## 2023-07-16 NOTE — Unmapped (Signed)
[facility] Delivery Pharm[facility] Clinical Assessment & Refill Coordination Note    John Crawford, [place]: 26-May-1956  Phone: There are no phone numbers on file.    All above HIPAA information was verified with patient.     Was a Nurse, learning disability used for this call? No    Specialty Medication(s):   CF/Pulmonary/Asthma: [name] 100mg /ml     Current Outpatient Medications   Medication Sig Dispense Refill    albuterol 2.5 mg /3 mL (0.083 %) nebulizer solution Inhale 3 mL (2.5 mg total) by nebulization Every four (4) hours. 540 mL 11    albuterol HFA 90 mcg/actuation inhaler Inhale 2 puffs every four (4) hours as needed for wheezing. 18 g 11    azithromycin (ZITHROMAX) 250 MG tablet Take 1 tablet (250 mg total) by mouth daily. 90 tablet 3    citalopram (CELEXA) 20 MG tablet Take 1 tablet (20 mg total) by mouth daily. 90 tablet 1    EPINEPHrine (EPIPEN) 0.3 mg/0.3 mL injection Inject 0.3 mL (0.3 mg total) into the muscle as needed for anaphylaxis. For emergency use with [name] 2 each 1    fluticasone propion-salmeterol (ADVAIR HFA) 115-21 mcg/actuation inhaler Inhale 2 puffs by mouth two (2) times a day. 36 g 3    mepolizumab ([name]) 100 mg/mL [name] Inject the contents of 1 pen (100 mg) under the skin every twenty-eight (28) days. 1 mL 6    metoPROLOL succinate (TOPROL-XL) 50 MG 24 hr tablet Take 1 tablet (50 mg total) by mouth daily. 90 tablet 2    ondansetron (ZOFRAN-ODT) 4 MG disintegrating tablet Dissolve 1 tablet (4 mg total) on the tongue every eight (8) hours as needed for nausea. 30 tablet 0    pantoprazole (PROTONIX) 40 MG tablet Take 1 tablet (40 mg total) by mouth Two (2) times a day. 60 tablet 0    predniSONE (DELTASONE) 10 MG tablet Take 1 tablet (10 mg total) by mouth daily. 30 tablet 11    rivaroxaban (XARELTO) 20 mg tablet Take 1 tablet (20 mg total) by mouth daily with evening meal. 90 tablet 0    sodium chloride 7% (PULMOSAL) 7 % Nebu Inhale 4 mL by nebulization two (2) times a day. 240 mL 6 tiotropium bromide (SPIRIVA RESPIMAT) 2.5 mcg/actuation inhalation mist Inhale 2 puffs by mouth daily. 4 g 11     No current facility-administered medications for this visit.        Changes to medications: Vasili reports no changes at this time.    No Known Allergies    Changes to allergies: No    SPECIALTY MEDICATION ADHERENCE     [name] 100 mg/ml: 0 doses of medicine on hand     Medication Adherence    Patient reported X missed doses in the last month: 0  Specialty Medication: [name]  Patient is on additional specialty medications: No  Informant: spouse          Specialty medication(s) dose(s) confirmed: Regimen is correct and unchanged.     Are there any concerns with adherence? No    Adherence counseling provided? Not needed    CLINICAL MANAGEMENT AND INTERVENTION      Clinical Benefit Assessment:    Do you feel the medicine is effective or helping your condition? Yes    Clinical Benefit counseling provided? Not needed    Adverse Effects Assessment:    Are you experiencing any side effects? No    Are you experiencing difficulty administering your medicine? No  Quality of Life Assessment:    Quality of Life    Rheumatology  Oncology  Dermatology  Cystic Fibrosis          How many days over the past month did your bronchiectasis  keep you from your normal activities? For example, brushing your teeth or getting up in the morning. 0    Have you discussed this with your provider? Not needed    Acute Infection Status:    Acute infections noted within Epic:  No active infections  Patient reported infection: None    Therapy Appropriateness:    Is therapy appropriate based on current medication list, adverse reactions, adherence, clinical benefit and progress toward achieving therapeutic goals? Yes, therapy is appropriate and should be continued     DISEASE/MEDICATION-SPECIFIC INFORMATION      For patients on injectable medications: Patient currently has 0 doses left.  Next injection is scheduled for unknown.    Asthma and Allergy: Have you had an asthma exacerbation in the last 30 days? N/a  Have you needed to use your rescue inhaler more often than usual in the last 30 days? No  Have you needed to take steroids for your asthma in the last 30 days? No    PATIENT SPECIFIC NEEDS     Does the patient have any physical, cognitive, or cultural barriers? No    Is the patient high risk? No    Did the patient require a clinical intervention? No    Does the patient require physician intervention or other additional services (i.e., nutrition, smoking cessation, social work)? No    SOCIAL DETERMINANTS OF HEALTH     At the [facility], we have learned that life circumstances - like trouble affording food, housing, utilities, or transportation can affect the health of many of our patients.   That is why we wanted to ask: are you currently experiencing any life circumstances that are negatively impacting your health and/or quality of life? No    Social Determinants of Health     Food Insecurity: No Food Insecurity (12/15/2019)    Hunger Vital Sign     Worried About Running Out of Food in the Last Year: Never true     Ran Out of Food in the Last Year: Never true   Internet Connectivity: Not on file   Housing/Utilities: Low Risk  (03/21/2020)    Housing/Utilities     Within the past 12 months, have you ever stayed: outside, in a car, in a tent, in an overnight shelter, or temporarily in someone else's home (i.e. couch-surfing)?: No     Are you worried about losing your housing?: No     Within the past 12 months, have you been unable to get utilities (heat, electricity) when it was really needed?: No   Tobacco Use: High Risk (05/26/2023)    Patient History     Smoking Tobacco Use: Every Day     Smokeless Tobacco Use: Never     Passive Exposure: Not on file   Transportation Needs: No Transportation Needs (03/21/2020)    PRAPARE - Transportation     Lack of Transportation (Medical): No     Lack of Transportation (Non-Medical): No   Alcohol Use: Not At Risk (11/16/2020)    Alcohol Use     How often do you have a drink containing alcohol?: Never     How many drinks containing alcohol do you have on a typical day when you are drinking?: 1 - 2  How often do you have 5 or more drinks on one occasion?: Never   Interpersonal Safety: Unknown (07/16/2023)    Interpersonal Safety     Unsafe Where You Currently Live: Not on file     Physically Hurt by Anyone: Not on file     Abused by Anyone: Not on file   Physical Activity: Not on file   Intimate Partner Violence: Not At Risk (11/06/2020)    Humiliation, Afraid, Rape, and Kick questionnaire     Fear of Current or Ex-Partner: No     Emotionally Abused: No     Physically Abused: No     Sexually Abused: No   Stress: Not on file   Substance Use: Low Risk  (12/04/2020)    Substance Use     Taken prescription drugs for non-medical reasons: Never     Taken illegal drugs: Never     Patient indicated they have taken drugs in the past year for non-medical reasons: Yes, [positive answer(s)]: Not on file   Social Connections: Not on file   Financial Resource Strain: Low Risk  (10/26/2020)    Overall Financial Resource Strain (CARDIA)     Difficulty of Paying Living Expenses: Not hard at all   Depression: Not at risk (11/08/2022)    PHQ-2     PHQ-2 Score: 0   Health Literacy: Not on file       Would you be willing to receive help with any of the needs that you have identified today? Not applicable       SHIPPING     Specialty Medication(s) to be Shipped:   CF/Pulmonary/Asthma: [name] 100mg /ml    Other medication(s) to be shipped:  Spiriva, Ventolin     Changes to insurance: No    Delivery Scheduled: Yes, Expected medication delivery date: 07/23/23.     Medication will be delivered via UPS to the confirmed prescription address in [facility].    The patient will receive a drug information handout for each medication shipped and additional FDA Medication Guides as required.  Verified that patient has previously received a Conservation officer, historic buildings and a Surveyor, mining.    The patient or caregiver noted above participated in the development of this care plan and knows that they can request review of or adjustments to the care plan at any time.      All of the patient's questions and concerns have been addressed.    Arnold Long, PharmD   [facility] Specialty Pharmacist

## 2023-07-22 MED FILL — VENTOLIN HFA 90 MCG/ACTUATION AEROSOL INHALER: RESPIRATORY_TRACT | 17 days supply | Qty: 18 | Fill #2

## 2023-07-22 MED FILL — NUCALA 100 MG/ML SUBCUTANEOUS AUTO-INJECTOR: SUBCUTANEOUS | 28 days supply | Qty: 1 | Fill #2

## 2023-07-22 MED FILL — SPIRIVA RESPIMAT 2.5 MCG/ACTUATION SOLUTION FOR INHALATION: RESPIRATORY_TRACT | 30 days supply | Qty: 4 | Fill #2

## 2023-07-29 NOTE — Unmapped (Signed)
Copied from CRM #[id number]. Topic: Access To Clinicians - Medication Refill  >> Jul 29, 2023  3:55 PM Loraine Leriche wrote:  The PAC has received an incoming call requesting medication refill/clarification/question:    Caller: Kyzer Krotz  Best callback number: [phone]    Type of request (refill, clarification, question): refill  Name of medication: pantoprazole (PROTONIX) 40 MG tablet  ondansetron (ZOFRAN-ODT) 4 MG disintegrating tablet  rivaroxaban (XARELTO) 20 mg tablet   azithromycin (ZITHROMAX) 250 MG table  predniSONE (DELTASONE) 10 MG tablet  citalopram (CELEXA) 20 MG tablet    Is there a preferred amount requested (e.g. 30 pills, 3 months worth - if no leave blank):     Desired pharmacy:   [facility]  [address]  [place] [address]  Phone: [phone] Fax: [phone]    Does caller request a callback from a nurse to discuss?: no     Jcano  07/29/23

## 2023-07-30 MED ORDER — AZITHROMYCIN 250 MG TABLET
ORAL_TABLET | Freq: Every day | ORAL | 3 refills | 90 days | Status: CP
Start: 2023-07-30 — End: ?
  Filled 2023-08-20: qty 90, 90d supply, fill #0

## 2023-07-30 MED ORDER — CITALOPRAM 20 MG TABLET
ORAL_TABLET | Freq: Every day | ORAL | 1 refills | 90 days | Status: CP
Start: 2023-07-30 — End: 2024-01-26

## 2023-07-30 MED ORDER — PREDNISONE 10 MG TABLET
ORAL_TABLET | Freq: Every day | ORAL | 11 refills | 30 days | Status: CP
Start: 2023-07-30 — End: ?

## 2023-07-30 MED ORDER — ONDANSETRON 4 MG DISINTEGRATING TABLET
ORAL_TABLET | Freq: Three times a day (TID) | ORAL | 0 refills | 10 days | Status: CP | PRN
Start: 2023-07-30 — End: ?
  Filled 2023-08-20: qty 30, 10d supply, fill #0

## 2023-07-30 MED ORDER — RIVAROXABAN 20 MG TABLET
ORAL_TABLET | Freq: Every day | ORAL | 0 refills | 90 days | Status: CP
Start: 2023-07-30 — End: 2024-01-26
  Filled 2023-08-20: qty 90, 90d supply, fill #0

## 2023-07-30 NOTE — Unmapped (Signed)
Addended by: Melvern Banker on: 07/30/2023 04:17 PM     Modules accepted: Orders

## 2023-07-30 NOTE — Unmapped (Signed)
The admin team has completed this request. The patient has been reached and is scheduled for:  - VISIT TYPE: Return  - DATE: 09/01/23  - TIME: 9:00 am  - PROVIDER: Lockie Pares  - ADDITIONAL NOTES:

## 2023-07-30 NOTE — Unmapped (Signed)
Spoke with patient spouse 09/01/23   Best date and time for patient and family.  Closing the loop.

## 2023-07-31 NOTE — Unmapped (Signed)
PCP changed to Dr Lockie Pares. Thanks for helping with this need

## 2023-08-05 ENCOUNTER — Ambulatory Visit: Admit: 2023-08-05 | Discharge: 2023-08-06 | Payer: MEDICARE

## 2023-08-10 MED ORDER — PANTOPRAZOLE 40 MG TABLET,DELAYED RELEASE
ORAL_TABLET | Freq: Two times a day (BID) | ORAL | 0 refills | 30 days
Start: 2023-08-10 — End: ?

## 2023-08-10 MED ORDER — METOPROLOL SUCCINATE ER 50 MG TABLET,EXTENDED RELEASE 24 HR
ORAL_TABLET | Freq: Every day | ORAL | 0 refills | 0.00000 days
Start: 2023-08-10 — End: 2024-05-08

## 2023-08-11 MED ORDER — PANTOPRAZOLE 40 MG TABLET,DELAYED RELEASE
ORAL_TABLET | Freq: Two times a day (BID) | ORAL | 3 refills | 30 days | Status: CP
Start: 2023-08-11 — End: ?
  Filled 2023-08-11: qty 60, 30d supply, fill #0

## 2023-08-11 MED ORDER — METOPROLOL SUCCINATE ER 50 MG TABLET,EXTENDED RELEASE 24 HR
ORAL_TABLET | Freq: Every day | ORAL | 0 refills | 90 days | Status: CP
Start: 2023-08-11 — End: ?

## 2023-08-11 MED FILL — VENTOLIN HFA 90 MCG/ACTUATION AEROSOL INHALER: RESPIRATORY_TRACT | 17 days supply | Qty: 18 | Fill #3

## 2023-08-11 NOTE — Unmapped (Signed)
Pharmacy requesting refill.  Last appointment on 05/26/2023.  Next appointment on 11/26/2023  Requested Prescriptions     Pending Prescriptions Disp Refills    pantoprazole (PROTONIX) 40 MG tablet 60 tablet 3     Sig: Take 1 tablet (40 mg total) by mouth Two (2) times a day.     RX routed to provider for review.

## 2023-08-12 MED ORDER — METOPROLOL SUCCINATE ER 50 MG TABLET,EXTENDED RELEASE 24 HR
ORAL_TABLET | Freq: Every day | ORAL | 2 refills | 90 days
Start: 2023-08-12 — End: 2024-05-10

## 2023-08-20 MED FILL — NUCALA 100 MG/ML SUBCUTANEOUS AUTO-INJECTOR: SUBCUTANEOUS | 28 days supply | Qty: 1 | Fill #3

## 2023-08-20 MED FILL — SPIRIVA RESPIMAT 2.5 MCG/ACTUATION SOLUTION FOR INHALATION: RESPIRATORY_TRACT | 30 days supply | Qty: 4 | Fill #3

## 2023-08-20 NOTE — Unmapped (Signed)
[facility] Refill Coordination Note    Specialty Medication(s) to be Shipped:   CF/Pulmonary/Asthma: [name] 100 mg/mL    Other medication(s) to be shipped:  Xarelto, Ondansetron and Spiriva     John Crawford, DOB: 10-03-1956  Phone: There are no phone numbers on file.      All above HIPAA information was verified with patient's family member, wife.     Was a Nurse, learning disability used for this call? No    Completed refill call assessment today to schedule patient's medication shipment from the [facility] [phone]).  All relevant notes have been reviewed.     Specialty medication(s) and dose(s) confirmed: Regimen is correct and unchanged.   Changes to medications: John Crawford reports no changes at this time.  Changes to insurance: No  New side effects reported not previously addressed with a pharmacist or physician: None reported  Questions for the pharmacist: No    Confirmed patient received a Conservation officer, historic buildings and a Surveyor, mining with first shipment. The patient will receive a drug information handout for each medication shipped and additional FDA Medication Guides as required.       DISEASE/MEDICATION-SPECIFIC INFORMATION        For patients on injectable medications: Patient currently has 0 doses left.  Next injection is scheduled for 08/19/23.    SPECIALTY MEDICATION ADHERENCE     Medication Adherence    Patient reported X missed doses in the last month: 0  Specialty Medication: [name] 100mg /ml  Patient is on additional specialty medications: No  Informant: spouse     Were doses missed due to medication being on hold? No    [name] 100 mg/mL: 0 days of medicine on hand       REFERRAL TO PHARMACIST     Referral to the pharmacist: Not needed      [facility]     Shipping address confirmed in Epic.       Delivery Scheduled: Yes, Expected medication delivery date: 08/21/23.     Medication will be delivered via UPS to the prescription address in Epic [place].    John Crawford John Crawford   [facility] Specialty Technician

## 2023-08-25 NOTE — Unmapped (Signed)
Internal Medicine Clinic Visit    Reason for visit: follow up    A/P:    1. Longstanding persistent atrial fibrillation (CMS-HCC)    2. 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 Chronic obstructive pulmonary disease, unspecified COPD type (CMS-HCC)    4. Sleep apnea, unspecified type    5. Tobacco use disorder    6. Healthcare maintenance    7. Nausea    8. Anxiety    9. Chronic periodontal disease      1. Longstanding persistent atrial fibrillation (CMS-HCC) (Primary)  S/p unsuccessful ablation in March 2021. In sinus rhythm today.   - Xarelto 20 mg daily  - Metoprolol 50 mg daily   - Cardiology referral     2. 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 Now with ongoing symptoms of DOE. Occasionally LE swelling. Exam with faint bibasilar crackles. No JVD in office today. Taking metoprolol as prescribed. Suspect DOE is largely related to his COPD but given crackles on exam and occasional LE swelling will repeat an Echo and refer him back to Cardiology for ongoing management.   - Cardiology referral   - Repeat Echocardiogram   - Metoprolol 50 mg    3. Chronic obstructive pulmonary disease, unspecified COPD type (CMS-HCC)  Last hospitalization 2022. Follows with [facility]. On oxygen at night (2L). Tolerating below medications. LDCT 05/26/23 LRADS1. Will need yearly screening.   - [facility] f/u 11/26/2023  - Cont. Symbicort BID, Spiriva, albuterol PRN  - Cont azithromycin daily  - Continue mepolizumab    4. Sleep apnea, unspecified type  STOP BANG 5  - Polysomnography ordered, not yet scheduled     5. Tobacco use disorder  Patient not interested in cessation. Currently smoking 1 ppd. Previously smoked 3-4 packs per day. LDCT completed 05/26/23 as above. Will need repeat in 2025  - Plan for repeat LDCT in 2025    6. Chronic periodontal disease  - Ambulatory referral to Dentistry; Future    HCM  - Influenza vaccine today     Return in about 4 months (around 12/30/2023).    Staffed with Dr. Clydene Pugh, discussed    __________________________________________________________    HPI:    67 year old male with a history of HFrecEF (EF 55% in 2021), A-fib/flutter (on Xarelto), and COPD (uses home oxygen at night, 2L).  Here for follow-up and medication refill.    Doing okay overall. Coming from [place], [place]. Quit drinking in March of 2024. Feeling better as a result. Started smoking cigarettes at age 71. Smoked 3-4 packs a day for many years. Now smoking only a pack a day. Not interested in stopping. Only thing that bothers him these days is some days is DOE. For example using a chain saw, going up stairs, walking up stairs. No longer follows with a heart doctor.     Was previously referred to hospice for severe COPD. Had a bad experience with hospice company. [name] has really helped with his breathing.     He works as a Curator on a farm.    __________________________________________________________    Medications:  Reviewed in EPIC  __________________________________________________________    Physical Exam:   Vital Signs:  Vitals:    09/01/23 0853   BP: 103/60   Pulse: 62   Temp: 36.6 ??C (97.8 ??F)   TempSrc: Temporal   SpO2: 90%   Weight: 78 kg (172 lb)  Height: 175.3 cm (5' 9)          Gen: Well appearing, NAD  Mouth: edentulous on the top, diffuse periodontal disease   CV: RRR, no murmurs, no JVD  Pulm: Diffuse end expiratory wheezes, faint bibasilar crackles  Ext: No edema    Medication adherence and barriers to the treatment plan have been addressed. Opportunities to optimize healthy behaviors have been discussed. Patient / caregiver voiced understanding.    Daleen Bo, MD   [facility] Internal Medicine PGY-2

## 2023-09-01 ENCOUNTER — Ambulatory Visit: Admit: 2023-09-01 | Discharge: 2023-09-02 | Payer: MEDICARE

## 2023-09-01 DIAGNOSIS — I4811 Longstanding persistent atrial fibrillation: Principal | ICD-10-CM

## 2023-09-01 DIAGNOSIS — I5032 Chronic diastolic (congestive) heart failure: Principal | ICD-10-CM

## 2023-09-01 DIAGNOSIS — F17200 Nicotine dependence, unspecified, uncomplicated: Principal | ICD-10-CM

## 2023-09-01 DIAGNOSIS — J449 Chronic obstructive pulmonary disease, unspecified: Principal | ICD-10-CM

## 2023-09-01 DIAGNOSIS — K056 Periodontal disease, unspecified: Principal | ICD-10-CM

## 2023-09-01 DIAGNOSIS — G4730 Sleep apnea, unspecified: Principal | ICD-10-CM

## 2023-09-01 DIAGNOSIS — Z0000 Encounter for general adult medical examination without abnormal findings: Principal | ICD-10-CM

## 2023-09-01 DIAGNOSIS — F419 Anxiety disorder, unspecified: Principal | ICD-10-CM

## 2023-09-01 DIAGNOSIS — R110 Nausea: Principal | ICD-10-CM

## 2023-09-01 MED ORDER — METOPROLOL SUCCINATE ER 50 MG TABLET,EXTENDED RELEASE 24 HR
ORAL_TABLET | Freq: Every day | ORAL | 0 refills | 90 days | Status: CP
Start: 2023-09-01 — End: ?

## 2023-09-01 MED ORDER — ONDANSETRON 4 MG DISINTEGRATING TABLET
ORAL_TABLET | Freq: Three times a day (TID) | ORAL | 0 refills | 10 days | Status: CP | PRN
Start: 2023-09-01 — End: ?
  Filled 2023-10-09: qty 30, 10d supply, fill #0

## 2023-09-01 MED ORDER — CITALOPRAM 20 MG TABLET
ORAL_TABLET | Freq: Every day | ORAL | 1 refills | 90 days | Status: CP
Start: 2023-09-01 — End: 2024-02-28

## 2023-09-01 MED ORDER — RIVAROXABAN 20 MG TABLET
ORAL_TABLET | Freq: Every day | ORAL | 0 refills | 90 days | Status: CP
Start: 2023-09-01 — End: 2024-02-28

## 2023-09-01 NOTE — Unmapped (Addendum)
It was a pleasure seeing you in clinic today. We have made the following plan.     I have placed a referral for you to Cardiology. They will call you to schedule. If you do not hear from them in the next week please call Cardiology- [phone] to schedule.  to schedule.  I have also ordered an echochardiogram to evaluate your hearts pump function. If you dont here from them please call the # above.  I have also referred you to the [facility] clinic. They should call you to schedule. [phone]   I have refilled your medicines today.   Follow up: Return in about 4 months (around 12/30/2023).     Otherwise, we look forward to seeing you in clinic at your next appointment!

## 2023-09-01 NOTE — Unmapped (Signed)
Immediately after or during the visit, I reviewed with the resident the medical history and the resident???s findings on physical examination.  I discussed with the resident the patient???s diagnosis and concur with the treatment plan as documented in the resident note. Olen Pel, MD

## 2023-09-11 MED ORDER — ADVAIR HFA 115 MCG-21 MCG/ACTUATION AEROSOL INHALER
Freq: Two times a day (BID) | RESPIRATORY_TRACT | 0 refills | 0 days
Start: 2023-09-11 — End: ?

## 2023-09-12 MED ORDER — ADVAIR HFA 115 MCG-21 MCG/ACTUATION AEROSOL INHALER
Freq: Two times a day (BID) | RESPIRATORY_TRACT | 6 refills | 90 days | Status: CP
Start: 2023-09-12 — End: ?

## 2023-09-13 NOTE — Unmapped (Signed)
[facility] and [facility] Delivery Pharmacy Refill Coordination Note    Specialty Medication(s) to be Shipped:   CF/Pulmonary/Asthma: [name] 100 mg/mL    Other medication(s) to be shipped:  Ventolin HFA, Spiriva Respimat, Prednisone, Pantoprazole     John Crawford, DOB: 19-Mar-1956  Phone: There are no phone numbers on file.      All above HIPAA information was verified with patient's family member, Spouse.     Was a Nurse, learning disability used for this call? No    Completed refill call assessment today to schedule patient's medication shipment from the [facility]  [phone]).  All relevant notes have been reviewed.     Specialty medication(s) and dose(s) confirmed: Regimen is correct and unchanged.   Changes to medications: [name] reports no changes at this time.  Changes to insurance: No  New side effects reported not previously addressed with a pharmacist or physician: None reported  Questions for the pharmacist: No    Confirmed patient received a Conservation officer, historic buildings and a Surveyor, mining with first shipment. The patient will receive a drug information handout for each medication shipped and additional FDA Medication Guides as required.       DISEASE/MEDICATION-SPECIFIC INFORMATION        For patients on injectable medications: Patient currently has 0 doses left.  Next injection is scheduled for overdue will take on 12/10.    SPECIALTY MEDICATION ADHERENCE     Medication Adherence    Patient reported X missed doses in the last month: 0  Specialty Medication: [name] 100 mg/mL [name]  Patient is on additional specialty medications: No              Were doses missed due to medication being on hold? No    [name]  100 mg/ml: 0 doses of medicine on hand        REFERRAL TO PHARMACIST     Referral to the pharmacist: Not needed      [facility]     Shipping address confirmed in Epic.       Delivery Scheduled: Yes, Expected medication delivery date: 09/16/23.     Medication will be delivered via UPS to the prescription address in Epic [facility].    Willette Pa   [facility] Delivery Pharm[facility]  Specialty Technician

## 2023-09-15 MED FILL — SPIRIVA RESPIMAT 2.5 MCG/ACTUATION SOLUTION FOR INHALATION: RESPIRATORY_TRACT | 30 days supply | Qty: 4 | Fill #4

## 2023-09-15 MED FILL — PANTOPRAZOLE 40 MG TABLET,DELAYED RELEASE: ORAL | 30 days supply | Qty: 60 | Fill #1

## 2023-09-15 MED FILL — NUCALA 100 MG/ML SUBCUTANEOUS AUTO-INJECTOR: SUBCUTANEOUS | 28 days supply | Qty: 1 | Fill #4

## 2023-10-06 DIAGNOSIS — R110 Nausea: Principal | ICD-10-CM

## 2023-10-06 MED ORDER — EPINEPHRINE 0.3 MG/0.3 ML INJECTION, AUTO-INJECTOR
Freq: Every day | INTRAMUSCULAR | 1 refills | 0.00 days | Status: CP | PRN
Start: 2023-10-06 — End: ?
  Filled 2023-10-09: qty 2, 2d supply, fill #0

## 2023-10-06 MED ORDER — ONDANSETRON 4 MG DISINTEGRATING TABLET
ORAL_TABLET | Freq: Three times a day (TID) | ORAL | 0 refills | 10.00 days | PRN
Start: 2023-10-06 — End: ?

## 2023-10-06 NOTE — Unmapped (Signed)
Per interface/mychart refill request.  Patient last seen by PCP on 09/01/23

## 2023-10-06 NOTE — Unmapped (Signed)
Pharmacy requesting refill.  Last appointment on 05/26/2023.  Next appointment on 11/26/2023.  Requested Prescriptions     Pending Prescriptions Disp Refills    EPINEPHrine (EPIPEN) 0.3 mg/0.3 mL injection 2 each 1     Sig: Inject 0.3 mL (0.3 mg total) into the muscle as needed for anaphylaxis. For emergency use with [name]     Rx routed to provider for review.

## 2023-10-06 NOTE — Unmapped (Signed)
Requested Renewals     ondansetron (ZOFRAN-ODT) 4 MG disintegrating tablet          Possible duplicate: Hover to review recent actions on this medication    Sig: Dissolve 1 tablet (4 mg total) on the tongue every eight (8) hours as needed for nausea.    Disp: 30 tablet    Refills: 0    Start: 10/06/2023    Class: Normal    For: Nausea    Last ordered: 1 month ago (09/01/2023) by Cleotis Nipper, MD       To be filled at: [facility]   Preferred delivery: Mail

## 2023-10-07 NOTE — Unmapped (Signed)
[facility] Refill Coordination Note    Specialty Medication(s) to be Shipped:   CF/Pulmonary/Asthma: [name] 100 mg/mL    Other medication(s) to be shipped:  zofran odt, epipen, albuterol solution, Ventolin HFA, Prednisone, Pantoprazole     John Crawford, DOB: 1956-05-19  Phone: There are no phone numbers on file.      All above HIPAA information was verified with patient's family member, Spouse.     Was a Nurse, learning disability used for this call? No    Completed refill call assessment today to schedule patient's medication shipment from the [facility]  [phone]).  All relevant notes have been reviewed.     Specialty medication(s) and dose(s) confirmed: Regimen is correct and unchanged.   Changes to medications: John Crawford reports no changes at this time.  Changes to insurance: No  New side effects reported not previously addressed with a pharmacist or physician: None reported  Questions for the pharmacist: No    Confirmed patient received a Conservation officer, historic buildings and a Surveyor, mining with first shipment. The patient will receive a drug information handout for each medication shipped and additional FDA Medication Guides as required.       DISEASE/MEDICATION-SPECIFIC INFORMATION        For patients on injectable medications: Patient currently has 0 doses left.  Next injection is scheduled for once delivered.    SPECIALTY MEDICATION ADHERENCE     Medication Adherence    Patient reported X missed doses in the last month: 0  Specialty Medication: [name] 100 mg/mL [name]  Patient is on additional specialty medications: No              Were doses missed due to medication being on hold? No    [name]  100 mg/ml: 0 doses of medicine on hand        REFERRAL TO PHARMACIST     Referral to the pharmacist: Not needed      [facility]     Shipping address confirmed in Epic.       Delivery Scheduled: Yes, Expected medication delivery date: 10/10/23.     Medication will be delivered via UPS to the prescription address in Epic WAM.    [facility]  Specialty Technician

## 2023-10-09 MED FILL — NUCALA 100 MG/ML SUBCUTANEOUS AUTO-INJECTOR: SUBCUTANEOUS | 28 days supply | Qty: 1 | Fill #5

## 2023-10-09 MED FILL — PANTOPRAZOLE 40 MG TABLET,DELAYED RELEASE: ORAL | 30 days supply | Qty: 60 | Fill #2

## 2023-10-09 MED FILL — ALBUTEROL SULFATE 2.5 MG/3 ML (0.083 %) SOLUTION FOR NEBULIZATION: RESPIRATORY_TRACT | 30 days supply | Qty: 540 | Fill #0

## 2023-11-03 DIAGNOSIS — F419 Anxiety disorder, unspecified: Principal | ICD-10-CM

## 2023-11-03 MED ORDER — CITALOPRAM 20 MG TABLET
ORAL_TABLET | Freq: Every day | ORAL | 0 refills | 90.00 days | Status: CP
Start: 2023-11-03 — End: ?

## 2023-11-06 DIAGNOSIS — R110 Nausea: Principal | ICD-10-CM

## 2023-11-06 DIAGNOSIS — J479 Bronchiectasis, uncomplicated: Principal | ICD-10-CM

## 2023-11-06 MED ORDER — ONDANSETRON 4 MG DISINTEGRATING TABLET
ORAL_TABLET | Freq: Three times a day (TID) | ORAL | 0 refills | 10.00 days | Status: CP | PRN
Start: 2023-11-06 — End: ?
  Filled 2023-11-10: qty 30, 10d supply, fill #0

## 2023-11-06 MED ORDER — METOPROLOL SUCCINATE ER 50 MG TABLET,EXTENDED RELEASE 24 HR
ORAL_TABLET | Freq: Every day | ORAL | 2 refills | 90.00 days | Status: CN
Start: 2023-11-06 — End: 2024-08-04

## 2023-11-06 NOTE — Unmapped (Signed)
[facility] Delivery Pharm[facility] Refill Coordination Note    John Crawford, [place]: 1955-10-27  Phone: There are no phone numbers on file.      All above HIPAA information was verified with patient's family member, wife.         11/05/2023     8:51 PM   Specialty Rx Medication Refill Questionnaire   Which Medications would you like refilled and shipped? Nucula, odansetron 4 tables left, pantoprazole 20 tabs left take two a day, metoprole 3 tablets left   Please list all current allergies: none   Have you missed any doses in the last 30 days? No   Have you had any changes to your medication(s) since your last refill? No   How many days remaining of each medication do you have at home? 3 some 4 and on 10   If receiving an injectable medication, next injection date is 11/15/2023   Have you experienced any side effects in the last 30 days? No   Please enter the full address (street address, city, state, zip code) where you would like your medication(s) to be delivered to. [address] [place], [place], [place] [address]   Please specify on which day you would like your medication(s) to arrive. Note: if you need your medication(s) within 3 days, please call the pharmacy to schedule your order at [phone]  11/08/2023   Has your insurance changed since your last refill? No   Would you like a pharmacist to call you to discuss your medication(s)? No   Do you require a signature for your package? (Note: if we are billing Medicare Part B or your order contains a controlled substance, we will require a signature) No         Completed refill call assessment today to schedule patient's medication shipment from the [facility] [phone]).  All relevant notes have been reviewed.       Confirmed patient received a Conservation officer, historic buildings and a Surveyor, mining with first shipment. The patient will receive a drug information handout for each medication shipped and additional FDA Medication Guides as required.         REFERRAL TO PHARMACIST     Referral to the pharmacist: Not needed      [facility]     Shipping address confirmed in Epic.     Delivery Scheduled: Yes, Expected medication delivery date: 11/11/23.     Medication will be delivered via UPS to the prescription address in Epic WAM.    Craige Cotta   [facility] Specialty Technician

## 2023-11-10 DIAGNOSIS — J449 Chronic obstructive pulmonary disease, unspecified: Principal | ICD-10-CM

## 2023-11-10 MED FILL — PANTOPRAZOLE 40 MG TABLET,DELAYED RELEASE: ORAL | 30 days supply | Qty: 60 | Fill #3

## 2023-11-10 NOTE — Unmapped (Signed)
John Crawford 's [name] shipment will be delayed as a result of a high copay.     I have spoken with the patient  at [phone]  and communicated the delay. We will call the patient back to reschedule the delivery upon resolution. We have not confirmed the new delivery date.

## 2023-11-14 DIAGNOSIS — J449 Chronic obstructive pulmonary disease, unspecified: Principal | ICD-10-CM

## 2023-11-17 MED FILL — VENTOLIN HFA 90 MCG/ACTUATION AEROSOL INHALER: RESPIRATORY_TRACT | 17 days supply | Qty: 18 | Fill #4

## 2023-11-24 ENCOUNTER — Ambulatory Visit
Admit: 2023-11-24 | Discharge: 2023-11-24 | Payer: MEDICARE | Attending: Nurse Practitioner | Primary: Nurse Practitioner

## 2023-11-24 ENCOUNTER — Inpatient Hospital Stay: Admit: 2023-11-24 | Discharge: 2023-11-24 | Payer: MEDICARE

## 2023-11-24 ENCOUNTER — Ambulatory Visit: Admit: 2023-11-24 | Discharge: 2023-11-24 | Payer: MEDICARE

## 2023-11-24 DIAGNOSIS — I4892 Unspecified atrial flutter: Principal | ICD-10-CM

## 2023-11-24 DIAGNOSIS — I491 Atrial premature depolarization: Principal | ICD-10-CM

## 2023-11-24 DIAGNOSIS — I5032 Chronic diastolic (congestive) heart failure: Principal | ICD-10-CM

## 2023-11-24 DIAGNOSIS — J449 Chronic obstructive pulmonary disease, unspecified: Principal | ICD-10-CM

## 2023-11-24 DIAGNOSIS — I4891 Unspecified atrial fibrillation: Principal | ICD-10-CM

## 2023-11-24 NOTE — Unmapped (Signed)
 Patient presented for placement of Zio patch.   Monitor 14 Days   Serial Number [id number]  Educated patient on care and after care of monitor.  Answered any and all questions and he/she/they voiced and verbalized understanding.   Documented in book and Zio suite

## 2023-11-24 NOTE — Unmapped (Unsigned)
 Referring Provider: Marylene Buerger, MD  [address]  [address]  [place],  [place] [address]   Primary Provider: Cleotis Nipper, MD  [address]  [place] [address]   Other Providers:  Lucienne Minks EP Follow Up Note    Reason for Visit:  John Crawford is a 68 y.o. male being seen for routine visit and continued care of typical atrial flutter .    Assessment & Plan:    1. Typical Atrial Flutter/ Afib seen on Zio  s/p flutter ablation  CHA2DS2-VASc - 1 (chf)  xarelto 20mg  daily  Metoprolol succinate 50mg  daily  2% Afib burden on Zio  BMP, CBC ****reviewed from 08/17/2020      2. HFrEF      2. Sleep Apnea?  Based on wife's description he very likely has some form of sleep apnea. Sleep study ordered but they have not been contacted about setting this up yet.      ECG: {Blank single:19197::deferred,NSR,Afib,A paced,V paced,AV paced}.  See official ECG report.    Follow-up:  No follow-ups on file.    History of Present Illness:  John Crawford is a 68 y.o. male with a past medical history of COPD and atrial flutter. He thinks he was in atrial flutter for probably more than a year as he reports having continuous palpitations for >1 year.    Interval History: *** [name] presents with his wife today in arrhythmia follow up. He is doing much better from a respiratory standpoint then when I last saw him in September. From a cardiac standpoint he is also doing well. He has no complaints or concerns. Denies palpitations, dizziness, syncope.   Denies bleeding, bruising issues.     Cardiovascular History & Procedures:    Cath / PCI:  **    CV Surgery:  **    EP Procedures and Devices:  12/15/2019 Typical Atrial Flutter ablation  1. Typical clockwise atrial flutter  2.  Patient moved quite a bit despite extensive sedation which made ablation challenging.   3.  Despite extensive ablation, he remained in atrial flutter though atrial cycle length had increased. Cardioversion was performed with one 200J shock with successful conversion to sinus rhythm. Unidirectional block was achieved but unclear if bidirectional block was achieved despite further ablation.     **    Non-Invasive Evaluation(s):  Echo:  02/23/2020 TTE    1. Limited study to assess ventricular function.    2. The left ventricle is normal in size with normal wall thickness.    3. LV systolic function is normal, LVEF visually estimated at 55-60%.    4. Mitral valve leaflets are mildly thickened with normal leaflet mobility.    5. The right ventricle is not well visualized but probably normal in size,  with normal systolic function.    12/14/2019 TTE    1. The left ventricle is normal in size with normal wall thickness.    2. The left ventricular systolic function is probably mildly decreased, LVEF is visually estimated at 45%.    3. The mitral valve leaflets are mildly thickened with normal leaflet mobility.    4. There is mild mitral valve regurgitation.    5. The aortic valve is probably trileaflet with mildly thickened leaflets with normal excursion.    6. The left atrium is moderately dilated in size.    7. Right ventricle is mildly dilated in size, with normal systolic function.  8. The right atrium is moderately dilated  in size.  **    Cardiac CT/MRI/Nuclear Tests:  **          Other Past Medical History:  See below for the complete EPIC list of past medical and surgical history.      Allergies:  Patient has no known allergies.    Current Medications:    Current Outpatient Medications on File Prior to Visit   Medication Sig    ADVAIR HFA 115-21 mcg/actuation inhaler Inhale 2 puffs by mouth twice daily    albuterol 2.5 mg /3 mL (0.083 %) nebulizer solution Inhale 3 mL (2.5 mg total) by nebulization Every four (4) hours.    albuterol HFA 90 mcg/actuation inhaler Inhale 2 puffs every four (4) hours as needed for wheezing.    azithromycin (ZITHROMAX) 250 MG tablet Take 1 tablet (250 mg total) by mouth daily.    citalopram (CELEXA) 20 MG tablet Take 1 tablet by mouth once daily    EPINEPHrine (EPIPEN) 0.3 mg/0.3 mL injection Inject 0.3 mL (0.3 mg total) into the muscle as needed for anaphylaxis. For emergency use with [name]    mepolizumab ([name]) 100 mg/mL [name] Inject the contents of 1 pen (100 mg) under the skin every twenty-eight (28) days.    metoPROLOL succinate (TOPROL-XL) 50 MG 24 hr tablet Take 1 tablet (50 mg total) by mouth daily.    ondansetron (ZOFRAN-ODT) 4 MG disintegrating tablet Dissolve 1 tablet (4 mg total) on the tongue every eight (8) hours as needed for nausea.    pantoprazole (PROTONIX) 40 MG tablet Take 1 tablet (40 mg total) by mouth Two (2) times a day.    predniSONE (DELTASONE) 10 MG tablet Take 1 tablet (10 mg total) by mouth daily.    rivaroxaban (XARELTO) 20 mg tablet Take 1 tablet (20 mg total) by mouth daily with evening meal.    sodium chloride 7% (PULMOSAL) 7 % Nebu Inhale 4 mL by nebulization two (2) times a day.    tiotropium bromide (SPIRIVA RESPIMAT) 2.5 mcg/actuation inhalation mist Inhale 2 puffs by mouth daily.     No current facility-administered medications on file prior to visit.     Family History:  The patient's family history includes Colon cancer in his mother; Heart attack in his father; Prostate cancer in his father.    Social history:  He  reports that he has been smoking cigarettes. He started smoking about 25 years ago. He has a 25.3 pack-year smoking history. He has never used smokeless tobacco. He reports current alcohol use. He reports that he does not use drugs.    Review of Systems:  As per HPI and as follows.  Rest of the review of ten systems is negative or unremarkable except as stated above.    Physical Exam:  VITAL SIGNS:   There were no vitals filed for this visit.     Wt Readings from Last 3 Encounters:   09/01/23 78 kg (172 lb)   05/26/23 76.7 kg (169 lb 3.2 oz)   11/08/22 83.3 kg (183 lb 9.6 oz)      Today's There is no height or weight on file to calculate BMI.      ***  GENERAL: chronically ill-appearing in no acute distress  HEENT: Normocephalic and atraumatic. Conjunctivae and sclerae clear and anicteric.    NECK: Supple.   CARDIOVASCULAR: Rate and rhythm are regular.  Normal S1, S2. There is no murmur, gallops or rubs  RESPIRATORY: Decreased breath  sounds at bases  There are intermittent expiratory no wheezes.  ABDOMEN: Soft, non-tender, Abdomen nondistended.    EXTREMITIES: There is no pedal edema, bilaterally.   SKIN: No rashes, ecchymosis or petechiae.  Warm, well perfused.   NEURO/PSYCH: Alert and oriented x 3. Affect appropriate.  Nonfocal    Pertinent Laboratory Studies:   No visits with results within 4 Month(s) from this visit.   Latest known visit with results is:   Appointment on 11/25/2022   Component Date Value Ref Range Status    Sodium 11/25/2022 138  135 - 145 mmol/L Final    Potassium 11/25/2022 4.4  3.4 - 4.8 mmol/L Final    Chloride 11/25/2022 102  98 - 107 mmol/L Final    CO2 11/25/2022 32.7 (H)  20.0 - 31.0 mmol/L Final    Anion Gap 11/25/2022 3 (L)  5 - 14 mmol/L Final    BUN 11/25/2022 11  9 - 23 mg/dL Final    Creatinine 16/07/9603 0.72 (L)  0.73 - 1.18 mg/dL Final    BUN/Creatinine Ratio 11/25/2022 15   Final    eGFR CKD-EPI (2021) Male 11/25/2022 >90  >=60 mL/min/1.18m2 Final    Glucose 11/25/2022 121 (H)  70 - 99 mg/dL Final    Calcium 54/06/8118 9.3  8.7 - 10.4 mg/dL Final       Lab Results   Component Value Date    PRO-BNP 91.3 02/22/2020    PRO-BNP 183.0 (H) 01/17/2020    PRO-BNP 312.0 (H) 12/13/2019    Creatinine 0.72 (L) 11/25/2022    Creatinine 0.62 (L) 11/08/2022    BUN 11 11/25/2022    BUN 13 11/08/2022    Sodium 138 11/25/2022    Potassium 4.4 11/25/2022    CO2 32.7 (H) 11/25/2022    Magnesium 2.0 10/26/2020    Total Bilirubin 0.5 10/25/2020    INR 1.82 08/16/2020       No results found for: DIGOXIN    Lab Results   Component Value Date    TSH 1.918 12/13/2019    Cholesterol 143 02/23/2020    Triglycerides 82 02/23/2020    HDL 59 02/23/2020    Non-HDL Cholesterol 84 02/23/2020    LDL Calculated 68 02/23/2020       Lab Results   Component Value Date    WBC 6.4 11/26/2021    HGB 14.1 11/26/2021    HCT 40.7 11/26/2021    Platelet 151 11/26/2021       Past Medical History:   Diagnosis Date    Atrial flutter (CMS-HCC)     Bronchiectasis without complication (CMS-HCC) 08/17/2020    COPD (chronic obstructive pulmonary disease) (CMS-HCC)     Tobacco abuse        Past Surgical History:   Procedure Laterality Date    PR COMPRE EP EVAL ABLTJ 3D MAPG TX SVT N/A 12/15/2019    Procedure: A-Flutter Ablation;  Surgeon: Webb Silversmith, MD;  Location: [facility] EP;  Service: Cardiology

## 2023-11-24 NOTE — Unmapped (Signed)
 We will place Ziopatch in clinic. I will figure out the cost of blood thinners.

## 2023-11-24 NOTE — Unmapped (Signed)
 DIVISION OF CARDIOLOGY  [facility] [place]        Date of Service: 11/24/2023      PCP: Referring Provider:   Cleotis Nipper, MD  [address]  [place] [address]  Phone: [phone]  Fax: [phone] Marylene Buerger, MD  [address]  [address]  [place],  [place] [address]  Phone: [phone]  Fax: [phone]     Assessment and Plan:     Hx of Reduced EF  Echo in 12/2019 with mildly reduced EF ~45% while in Aflutter RVR. This likely represents LVEF that was worse in the setting of tachyarrhythmia. He had no chronic clinical HF symptoms. EF has been normal since. Echo in clinic with normal LVEF. Overall, he has never had clinical HF outside of aflutter RVR. Overall, his underlying lung disease seems to be the culprit for his worsening dyspnea and functional status.     Hx of Aflutter s/p Ablation - Afib - Frequent PACs  Incomplete ablation in 12/2019, however he has had no documented recurrence of aflutter since. One isolated afib episode on Ziopatch in 02/2020. EKGs have shown frequent PACs, EKG in clinic atrial bigeminy. DOAC is no longer affordable and cost of medications and medical care is becoming very burdensome. Will reassess fib/flutter burden on Ziopatch so we can have discussion of utility of DOAC moving forward.   - Ziopatch  - trial cost of Eliquis       Return in about 3 months (around 02/21/2024) for In-person.      Subjective:        Reason for Consultation: C/f HF    History of Present Illness:     Hx of Aflutter. Presented in RVR in 2021. Echo while in flutter showed EF 45%. Underwent flutter ablation 12/15/19. Repeat echo 02/2020 revealed normal EF.     Presented to PCP reporting worsening DOE and faint crackles on exam. Referral was made 08/2023. There was concern for worsening HF. He has worsening LE edema throughout the day, but improves with propping up his feet.     Xarelto was $700.     He has had worsening dyspnea on exertion over the past few months as well as fatigue.     Wife reports he was on hospice previously due to severe COPD.    EP Procedures and Devices:  12/15/2019 Typical Atrial Flutter ablation  1. Typical clockwise atrial flutter  2.  Patient moved quite a bit despite extensive sedation which made ablation challenging.   3.  Despite extensive ablation, he remained in atrial flutter though atrial cycle length had increased. Cardioversion was performed with one 200J shock with successful conversion to sinus rhythm. Unidirectional block was achieved but unclear if bidirectional block was achieved despite further ablation.       Ziopatch 02/2020  Patient had a min HR of 52 bpm, max HR of 202 bpm, and avg HR of 78 bpm.   Predominant underlying rhythm was Sinus Rhythm. Bundle Branch Block/IVCD was   present. 1 run of Ventricular Tachycardia occurred lasting 4 beats with a max rate of   144 bpm (avg 142 bpm). Atrial Fibrillation occurred (2% burden), ranging   from 80-202 bpm (avg of 123 bpm), the longest lasting 44 mins 2 secs with an avg   rate of 114 bpm. Isolated SVEs were frequent (5.7%, 87566), SVE Couplets were   rare (<1.0%, 1207), and SVE Triplets were rare (<1.0%, 88). Isolated VEs were rare   (<  1.0%), VE Couplets were rare (<1.0%), and no VE Triplets were present.   Patient's triggers however were associated with sinus rhythm.     Non-Invasive Evaluation(s):  Echo:  02/23/2020 TTE    1. Limited study to assess ventricular function.    2. The left ventricle is normal in size with normal wall thickness.    3. LV systolic function is normal, LVEF visually estimated at 55-60%.    4. Mitral valve leaflets are mildly thickened with normal leaflet mobility.    5. The right ventricle is not well visualized but probably normal in size,  with normal systolic function.    12/14/2019 TTE    1. The left ventricle is normal in size with normal wall thickness.    2. The left ventricular systolic function is probably mildly decreased, LVEF is visually estimated at 45%.    3. The mitral valve leaflets are mildly thickened with normal leaflet mobility.    4. There is mild mitral valve regurgitation.    5. The aortic valve is probably trileaflet with mildly thickened leaflets with normal excursion.    6. The left atrium is moderately dilated in size.    7. Right ventricle is mildly dilated in size, with normal systolic function.    8. The right atrium is moderately dilated  in size.    I have independently reviewed of all the diagnostic studies. I have reviewed the old medical records from [facility] prior to this clinic visit.       Past medical history:  Patient Active Problem List   Diagnosis    COPD (chronic obstructive pulmonary disease) (CMS-HCC)    Tobacco use disorder    Alcohol use    Atrial flutter with rapid ventricular response (CMS-HCC)    Shortness of breath    Chest pain    History of atrial flutter    Bronchiectasis without complication (CMS-HCC)    Rhinovirus infection    Bipolar disorder (CMS-HCC)    Acute on chronic respiratory failure with hypoxia (CMS-HCC)    Eosinophilia    Hypereosinophilic syndrome    Atrial fibrillation and flutter (CMS-HCC)    PAC (premature atrial contraction)       Medications:   Patient's Medications   New Prescriptions    No medications on file   Previous Medications    ADVAIR HFA 115-21 MCG/ACTUATION INHALER    Inhale 2 puffs by mouth twice daily    ALBUTEROL 2.5 MG /3 ML (0.083 %) NEBULIZER SOLUTION    Inhale 3 mL (2.5 mg total) by nebulization Every four (4) hours.    ALBUTEROL HFA 90 MCG/ACTUATION INHALER    Inhale 2 puffs every four (4) hours as needed for wheezing.    AZITHROMYCIN (ZITHROMAX) 250 MG TABLET    Take 1 tablet (250 mg total) by mouth daily.    CITALOPRAM (CELEXA) 20 MG TABLET    Take 1 tablet by mouth once daily    EPINEPHRINE (EPIPEN) 0.3 MG/0.3 ML INJECTION    Inject 0.3 mL (0.3 mg total) into the muscle as needed for anaphylaxis. For emergency use with [name]    MEPOLIZUMAB ([name]) 100 MG/ML [name]    Inject the contents of 1 pen (100 mg) under the skin every twenty-eight (28) days.    METOPROLOL SUCCINATE (TOPROL-XL) 50 MG 24 HR TABLET    Take 1 tablet (50 mg total) by mouth daily.    ONDANSETRON (ZOFRAN-ODT) 4 MG DISINTEGRATING TABLET    Dissolve 1 tablet (4 mg total)  on the tongue every eight (8) hours as needed for nausea.    PANTOPRAZOLE (PROTONIX) 40 MG TABLET    Take 1 tablet (40 mg total) by mouth Two (2) times a day.    PREDNISONE (DELTASONE) 10 MG TABLET    Take 1 tablet (10 mg total) by mouth daily.    RIVAROXABAN (XARELTO) 20 MG TABLET    Take 1 tablet (20 mg total) by mouth daily with evening meal.    SODIUM CHLORIDE 7% (PULMOSAL) 7 % NEBU    Inhale 4 mL by nebulization two (2) times a day.    TIOTROPIUM BROMIDE (SPIRIVA RESPIMAT) 2.5 MCG/ACTUATION INHALATION MIST    Inhale 2 puffs by mouth daily.   Modified Medications    No medications on file   Discontinued Medications    No medications on file       Allergies:  No Known Allergies    Social History:  He  reports that he has been smoking cigarettes. He started smoking about 25 years ago. He has a 25.3 pack-year smoking history. He has never used smokeless tobacco. He reports current alcohol use. He reports that he does not use drugs.    Family History:  His family history includes Colon cancer in his mother; Heart attack in his father; Prostate cancer in his father.    Review of Systems  10 systems were reviewed and negative except as noted in HPI.      Objective:       Physical Exam  BP 118/74 (BP Site: L Arm, BP Position: Sitting, BP Cuff Size: Medium)  - Pulse 71  - Ht 175.3 cm (5' 9.02)  - Wt 79.7 kg (175 lb 12.8 oz)  - SpO2 91%  - BMI 25.95 kg/m??    Wt Readings from Last 3 Encounters:   11/24/23 79.7 kg (175 lb 12.8 oz)   09/01/23 78 kg (172 lb)   05/26/23 76.7 kg (169 lb 3.2 oz)       General Appearance: NAD, sitting on exam table   HEENT: Sclera anicteric, EOMI.   CV: normal rate, irregular rhythm, no murmurs rubs or gallops. No appreciable JVD. No LE edema.   Resp: decreased breath sounds bilaterally with poor air movement and intermittent wheezing, mild increased WOB, able to speak in full sentences, no use of accessory muscles  Neuro: AOAx3. Able to engage in conversation. Face symmetric.     Most recent labs   Lab Results   Component Value Date    Sodium 138 11/25/2022    Potassium 4.4 11/25/2022    Chloride 102 11/25/2022    CO2 32.7 (H) 11/25/2022    BUN 11 11/25/2022    Creatinine 0.72 (L) 11/25/2022    Magnesium 2.0 10/26/2020     Lab Results   Component Value Date    HGB 14.1 11/26/2021    MCV 100.3 (H) 11/26/2021    Platelet 151 11/26/2021     Lab Results   Component Value Date    Cholesterol 143 02/23/2020    Triglycerides 82 02/23/2020    HDL 59 02/23/2020    Non-HDL Cholesterol 84 02/23/2020    LDL Calculated 68 02/23/2020    Hemoglobin A1C 5.3 02/22/2020    TSH 1.918 12/13/2019    PRO-BNP 91.3 02/22/2020    INR 1.82 08/16/2020              A total of 25 minutes was spent on this visit reviewing previous notes, counseling the patient on this  clinical conditions, ordering tests - 10, adjusting meds, and documenting the findings in the note.       Suanne Marker, MD  Cardiovascular Fellow, PGY5  Division of Cardiology

## 2023-11-26 ENCOUNTER — Encounter: Admit: 2023-11-26 | Discharge: 2023-11-27 | Payer: MEDICARE

## 2023-11-26 ENCOUNTER — Ambulatory Visit: Admit: 2023-11-26 | Discharge: 2023-11-27 | Payer: MEDICARE

## 2023-11-26 DIAGNOSIS — Z720 Tobacco use: Principal | ICD-10-CM

## 2023-11-26 DIAGNOSIS — J471 Bronchiectasis with (acute) exacerbation: Principal | ICD-10-CM

## 2023-11-26 DIAGNOSIS — J441 Chronic obstructive pulmonary disease with (acute) exacerbation: Principal | ICD-10-CM

## 2023-11-26 DIAGNOSIS — G4733 Obstructive sleep apnea (adult) (pediatric): Principal | ICD-10-CM

## 2023-11-26 DIAGNOSIS — J449 Chronic obstructive pulmonary disease, unspecified: Principal | ICD-10-CM

## 2023-11-26 MED ORDER — NICOTINE (POLACRILEX) 4 MG BUCCAL LOZENGE
LOZENGE | BUCCAL | 3 refills | 2.00 days | Status: CP | PRN
Start: 2023-11-26 — End: ?

## 2023-11-26 MED ORDER — BUDESONIDE 0.5 MG/2 ML SUSPENSION FOR NEBULIZATION
Freq: Two times a day (BID) | RESPIRATORY_TRACT | 2 refills | 30.00 days | Status: CP
Start: 2023-11-26 — End: 2024-02-24

## 2023-11-26 MED ORDER — IPRATROPIUM 0.5 MG-ALBUTEROL 3 MG (2.5 MG BASE)/3 ML NEBULIZATION SOLN
Freq: Two times a day (BID) | RESPIRATORY_TRACT | 1 refills | 45.00 days | Status: CP
Start: 2023-11-26 — End: 2024-02-24

## 2023-11-26 MED ORDER — LEVOFLOXACIN 750 MG TABLET
ORAL_TABLET | Freq: Every day | ORAL | 0 refills | 10.00 days | Status: CP
Start: 2023-11-26 — End: 2023-12-06

## 2023-11-26 MED ORDER — NICOTINE 21 MG/24 HR DAILY TRANSDERMAL PATCH
MEDICATED_PATCH | TRANSDERMAL | 2 refills | 28.00 days | Status: CP
Start: 2023-11-26 — End: ?

## 2023-11-26 MED ORDER — PREDNISONE 20 MG TABLET
ORAL_TABLET | ORAL | 0 refills | 10.00 days | Status: CP
Start: 2023-11-26 — End: 2023-12-06

## 2023-11-26 NOTE — Unmapped (Signed)
 Pulmonary Clinic - Follow-Up Visit    Referring Physician :  Dellis Filbert  PCP:     Cleotis Nipper, MD  Reason for Consult:   COPD     HISTORY:     History of Present Illness:  Initial Hx   Mr. Diebel is a 68 y.o. male with a history of A fib/flutter (sp unsuccessful ablation 2021, on Xarelto and rate control), HFrecEF, and active tobacco dependency, COPD, bronchiectasis, and sleep apnea whom we were consulted for COPD evaluation and management. Established with our clinic 06/2020. At that time pt reported COPD dx ~2018 with course complicated by persistent SOB and recurrent infections/flares requiring hospitalization. Sx include yellow/green sputum production in AM, persistent cough, DOE. Prescribed oxygen at night following one of his hospitalizations which he continues to use. Active smoker at that time though reduced from prior (1 cig/day; up to 2-3 ppd at one point, started age 78). COPD confirmed based on severe obstructive pattern on PFTs, symptoms, and smoking history. Pt was also diagnosed with bronchiectasis based on chest imaging.     Early on, palliative mgmt was discussed iso poor quality of life (severe DOE , recurrent hospitalizations). Ultimately, pt felt he was not ready for hospice. Per prior documentation, He does want to avoid repeated hospitalization but also reports wanting to be able to present to the ED if he is having a flare givent that it may be triggered by something treatable like a viral infection or PNA. He states that he has more good days than bad and that overall he still spends a significant amount of his time doing activities he enjoys and spending time with his wife.     While in our care, his Symbicort was increased and daily prednisone and azithromycin were added. Airway clearance initiated for bronchiectasis. With these interventions, pt felt much better and hospital admissions decreased. Started on Mepolizumab/[name] (eos 0.8 in 2021) at some point which has been well tolerated. Continues to use O2 at night and with exertion. Resting O2 91-92%. Also uses airway clearance.    Of note, patient continues to smoke and has no interest in quitting.  He does not believe smoking causes COPD and feels other environmental factors from his past are to blame. He became quite verbally aggressive and angry at our 05/26/23 visit when this was discussed. Ultimately I stepped away to allow the patient to calm down. On my return, he apologized for his behavior.    Patient is being evaluated for OSA. At our last visit (05/2023), he relayed that he was awaiting a call from the sleep clinic to schedule his study.     Interval Hx 11/25/22 (Video Visit, Converted from In-Person Due to [facility])  Since our last visit, patient was seen by his new PCP, the lovely Dr. Sabino Dick. At that time endorsed DOE. Given h/o Afib and HFrEF with no recent cardiology surveillance visit, Dr. Lockie Pares placed a cardiology referral and ordered a TTE. Seen by Dr. Domingo Madeira [facility]) who reviewed echo and confirmed that there are no significant structural abnormalities that would explain current DOE. Suspected pulmonary etiology.    At today's visit, pt endorses feeling short of breath over the past two weeks. Endorses increased phlegm production. Remains yellow/white which is consistent with baseline. Increased cough. Goes into coughing spells that make him dizzy. At these times, SpO2 80s per pulse oximetry. Still not requiring oxygen at rest. No fevers. Questionable chills though wife notes that he frequently works outside  in freezing temperatures in just a long sleeve. Of note, pt also with diffuse itching that has included the inside of his mouth. No known new environmental exposures.    Pt currently out of [name] and Spiriva. Per pt's wife, pharmacy insurance coverage changed and now copay for [name] is ~$1500. Spiriva likewise now financially prohibitive. Has a bit more Advair left.     PFTs completed 11/23/22 in anticipation of today's visit (most recent PFTs prior to this are from 2022).   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 Lives in [place], [place], with his wife, [name]. Lifelong tobacco use. Started smoking cigarettes at age 38. Up to 3-4 ppd for many years. Currently 1 ppd. Not interested in quitting. Previously drank alcohol but quit 12/2022. No active recreational drug use.  Due for [name], about a week late. Has been out of Spiriva and Advair. Previously on Symbicort which was more helpful than the advair. Has been out of Spiriva for 3 weeks.    Medicare A and B. [employer] - Part D.     Other History:  The social history and family history were personally reviewed and updated in the patient's electronic medical record.    Family History   Problem Relation Age of Onset    Colon cancer Mother     Heart attack Father     Prostate cancer Father      Social History     Socioeconomic History    Marital status: Married   Occupational History    Occupation: Curator    Tobacco Use    Smoking status: Every Day     Current packs/day: 1.00     Average packs/day: 1 pack/day for 25.3 years (25.3 ttl pk-yrs)     Types: Cigarettes     Start date: 08/07/1998    Smokeless tobacco: Never    Tobacco comments:     started smoking at 17, 2ppd for 35 years, currently smoking 1ppdd   Vaping Use    Vaping status: Never Used   Substance and Sexual Activity    Alcohol use: Yes     Comment: 1 to 2 cans of beer daily    Drug use: Never    Sexual activity: Yes     Social Drivers of Psychologist, prison and probation services Strain: Low Risk  (10/26/2020)    Overall Financial Resource Strain (CARDIA)     Difficulty of Paying Living Expenses: Not hard at all   Food Insecurity: No Food Insecurity (12/15/2019) Hunger Vital Sign     Worried About Running Out of Food in the Last Year: Never true     Ran Out of Food in the Last Year: Never true   Transportation Needs: No Transportation Needs (03/21/2020)    PRAPARE - Therapist, art (Medical): No     Lack of Transportation (Non-Medical): No       Home Medications:  See Medication Management in Epic    Allergies: KNDA  Allergies as of 11/26/2023    (No Known Allergies)     Review of Systems:  A comprehensive review of systems was completed and negative except as noted in HPI.    PHYSICAL EXAM:   No vitals available, video visit.    General:  Alert and oriented, no acute distress though appears fatigued  Lungs: non-labored breathing on RA, speaking in complete sentences without dyspnea    LABORATORY and RADIOLOGY DATA:     Pulmonary Function Tests/Interpretation:  11/22/23: Progression of previously demonstrated severe obstructive ventilatory defect (FEV1 17% predicted). Reduced vital capacity which may be due to gas trapping (though restrictive disease cannot be excluded without lung volumes).      Pulmonary Function Test Results  Date FVC FEV1 FEV1/FVC TLC DLCO Other   11/24/23 1.39 (34%) 0.54 (17%) 39 - -    08/13/21 2.16 (49%) 0.92 (27%) 42 - -    09/20/20 2.93 (65%) 1.03 (29%) 35 - 20.52 (75%)    06/19/20 2.71 (60%) 0.95 (28%) 35 - 14.38 (53%) Post bronchodilator values     :  12/05/21: w/o O2 requirement, 20 sec pause during  01/22/21: with 2L O2 requirement, previously without O2 need.     STOP BANG 5 - OSA confirmed by polysomnography, titration study needed    Pertinent Laboratory Data:  2021 abs eos 0.8  IgG subclasses and IgA, IgM, IgE WNL    Pertinent Imaging Data:  08/05/23 LDCT:   (Below as per reading radiologist, Dr. Adalberto Cole)  - Lung RADS-1. No nodules or other suspicious findings for lung cancer.  - Again noted is extensive bilateral bronchiectasis as previously reported along with significant coronary artery calcification.  - Recommendations:  Continue annual screening.     03/11/22 LDCT: Cystic bronchiectasis, bronchial wall thickening, tree-in-bud opacities and impacted airways in bilateral lung. Secretions in bilateral main bronchus. Resolution of 0.6 cm nodule in the right upper lobe. No suspicious pulmonary nodules.     11/24/23 TTE: Exam completed, formal read pending. Per cardiologist same day documentation, normal EF    ASSESSMENT and PLAN   Mr. Ginyard is a 68 y.o. male with a history of A fib/flutter, hx of HFrEF,  COPD, and bronchectasis whom we are seeing in consultation requested by Terri Piedra for COPD evaluation/mgmt. Dx with severe COPD as well as bronchiectasis.    Established with Dr. Crecencio Mc Pulmonary 2021. Referral for evaluation of COPD. At this time, multiple (3) hospitalizations over the past year attributed to COPD exacerbations. More numerous outpatient txs for exacerbations. On 2L O2 nightly, prescribed during prior hospitalization. At that time, on Spiriva+Symbicort as well as PRN albuterol inhaler and nebs. Previously on Advair.     Interventions while in our care as follows:  - Spiriva dose increased   - Inhaler teaching  - Azithromycin prescribed (paused dt cf MAC, resumed following neg AFB)  - Chronic prednisone prescribed (iso end stage COPD)  - Palliative referral; hospice evaluation, ultimately declined  - [name] started (eos 0.8 2021); SE nausea, controlled w/ Zofran  - HTS + aerobika for airway clearance iso new dx bronchiectasis  - Bronchiectasis wu: negative to date - WBC, IgG levels, Alpha one levels (MM) all normal. Sputum cx, fungal, and AFB cx negative to date    Over the past few years, sx much improved/well controlled. Pt attributes improvement to [name]. Unfortunately today, pt with 2wks of sx consistent with COPD flare (increased cough and sputum production, new vs significantly worsened DOE, generalized fatigue). No fever, questionable chills. HDS and nontoxic appearing. PFTs show severe obstruction at baseline and are even worse than prior (FEV1 0.92 08/23/21 to 0.54 11/24/23). Hopefully PFTs reflect current dyspnea rather than progression of end stage COPD. Sx profile cw COPD flare. Likely related to being out of  maintenance biologic and inhaler therapy due to insurance issues.    In absence of systemic sx and sputum color change, less cf bacterial pneumonia. However, given known bronchiectasis, will treat with antipseudomonal abx in addition to steroid burst.    Acute COPD Exacerbation  - Symptom profile: persistent cough, yellow/green sputum production, DOE, fatigue  - Last hospitalization 2022, last steroid burst 08/2021 until today (11/26/23)  - Prescribed steroid burst/taper (40 mg x5d, 30 mg x3d, 20 mg x2d).   - Pt to hold chronic 10 mg qd prednisone while on steroid burst, resume on completion  - Pt currently prescribed Symbicort and Advair as well as albuterol PRN  - Previously on Symbicort, switched to [employer] coverage (pt reports greater benefit from Symbicort)   - Due to insurance issues, no longer able to afford copay for [name] and Symbicort. Will reach out to internal pharmacists to see if alternative medication needs to be prescribed or if we can submit appeal   - While out of maintenance therapy, will bridge with nebs (ICS + LABA/LAMA); prescribed duoneb and pulmicort BID  - Azithromycin daily, cont  - Has albuterol nebs at home to use PRN   - Nursing education on using a spacer at initial apt   - Mepolizumab now financially prohibitive, will reach out to internal pharmacists re: appeal   - Nausea and HA with injection: resolved w zofran PRN and tylenol  - Defer pulmonary rehab iso acute illness    Bronchiectasis, Possible Exacerbation  - Prescribed levaquin 750 mg qd x 10d  - Mid lung zone predominant cw bronchiectasis  - Cannot rule out bronchiectasis flare, so will treat with abx with PsA coverage  - IgG subclasses and IgA, IgM, IgE WNL  - ABPA panel with IgE 79pt   - Alpha 1 WNL  - Sputum culture, fungal, and AFB 12/21 and 1/22 NEG   -  Aerobika BID with albuterol & HTS 7%  - Continues to have significant reflux sx, due to patient having severe lung disease prefer to avoid invasive procedures. For this reason, PPI prev increased to BID. If continues to have significant sx, will refer to GI. Advised to stop taking Zofran for reflux sx.      Obstructive Sleep Apnea  Cf Chronic Hypercapneic Respiratory Failure iso Severe COPD  STOP BANG 5. OSA confirmed on 2022 polysomnography. Needs titration study.   - Previously ordered but referral ultimately closed due to inability to contact patient. Replaced referral and provided pt phone number for sleep clinic  - Ordered VBG to assess for chronic hypercapnea; if present, BiPAP/AVAPs indicated over CPAP     Tobacco Dependency  Pt with lifelong tobacco dependency. Started smoking cigarettes at age 72. Smoked 3-4 packs a day for many years. Quite for 3 mos at one time. Currently smoking 1 ppd. Up to 05/2023 remained pre-contemplative. Today patient interested in quitting. Had vivid dreams with Varenicline but is going to consider other pharmacologics. Open to NRT and counseling.   - Order nicotine lozenges and patches  - Place fam med counseling referral  - Defer pharmacologics for now   - AE w/ Chantix    Sleep Apnea  - STOP BANG 5  - Sleep study 03/10/21 with OSA, at that time 1L O2 requirement at night   - Pt using 2.5L at night with desaturations when he wakes up, prev asked to increase to 4L  - 11/2022 titration study referral closed with comment that they were unable to contact  patient  - Referral for titration study placed  - Patient is to call and schedule CPAP titration study, will FU O2 requirement with this study as well    Lung Cancer Screening   Despite discussion on many different occasions with many different providers, he remains in the pre-contemplation stage and is not open to quitting. Lung cancer screening indicated given age and active tobacco use. 6 mm nodule (Lung-RADS 3S) found on LDCT 06/2020; this has since resolved. No concerning findings on 2023, 2024 scans.   - Declines nicotine replacement therapy, Chantix or other medications to aid with quitting  - Declines referral for smoking cessation counseling  - RADS-1 on 2024 LDCT, annual scan next due 07/2024    Healthcare Maintenance  - Influenza: UTD with 2024-2025 seasonal vaccine  - Pneumonia: series complete (2021, 2023)  - Lung cancer screening: See above   - Covid-19: 05/24/2020, 06/19/20, 07/19/2020, due; encouraged to get booster at next outpt visit vs local pharmacy  - RSV: due, deferred at present due to other priorities    Immunization History   Administered Date(s) Administered    COVID-19 VACCINE,MRNA(MODERNA)(PF) 05/24/2020, 06/19/2020, 07/19/2020    INFLUENZA IIV3 HIGH DOSE 75YRS+(FLUZONE) 09/01/2023    Influenza Vaccine Quad(IM)6 MO-Adult(PF) 12/16/2019, 06/19/2020, 08/13/2021, 11/08/2022    Pneumococcal Conjugate 13-Valent 08/28/2020    Pneumococcal Conjugate 20-valent 11/26/2021    TD(TDVAX),ADSORBED,2LF(IM)(PF) 03/27/2010    TdaP 03/23/2018       Patient will return to clinic in 3 months or sooner if needed.    This patient was discussed with attending physician, Dr. Doralee Albino who agrees with the assessment and plan above.     ZO:XWRUE Tera Partridge, Rushie Chestnut, MD    Benjaman Kindler, MD

## 2023-11-27 DIAGNOSIS — J441 Chronic obstructive pulmonary disease with (acute) exacerbation: Principal | ICD-10-CM

## 2023-11-27 DIAGNOSIS — J449 Chronic obstructive pulmonary disease, unspecified: Principal | ICD-10-CM

## 2023-11-27 NOTE — Unmapped (Signed)
 I reviewed the telehealth patient visit with the resident via phone.  I reviewed the resident???s note.  I agree with the resident???s findings and plan. Epimenio Sarin, MD

## 2023-11-27 NOTE — Unmapped (Signed)
 Attending Physician Attestation:      I confirm that I interviewed and examined the patient, reviewed all the relevant lab and diagnostic data, and we agreed upon a plan of treatment. I agree with the findings, assessment, and plan as documented by Dr. Domingo Madeira.    Kennis Carina, MD

## 2023-11-28 DIAGNOSIS — I4892 Unspecified atrial flutter: Principal | ICD-10-CM

## 2023-11-28 DIAGNOSIS — I4891 Unspecified atrial fibrillation: Principal | ICD-10-CM

## 2023-11-28 MED ORDER — DABIGATRAN ETEXILATE 150 MG CAPSULE
ORAL_CAPSULE | Freq: Two times a day (BID) | ORAL | 11 refills | 30 days | Status: CP
Start: 2023-11-28 — End: 2024-11-27

## 2023-11-28 NOTE — Unmapped (Signed)
 Addended byCharlotta Newton on: 11/28/2023 07:56 AM     Modules accepted: Orders

## 2023-11-30 ENCOUNTER — Ambulatory Visit: Admit: 2023-11-30 | Discharge: 2023-12-01 | Payer: MEDICARE

## 2023-12-03 NOTE — Unmapped (Addendum)
 Mr. Hackler is a gentleman with severe COPD, tobacco dependency (currently trying to quit), and bronchiectasis who presented virtually to clinic 2/19 with 2 weeks of acute on chronic shortness of breath, increased phlegm production, and increased cough.  No systemic symptoms or change in sputum color.  While he is satting appropriately at rest he is severely limited in mobility and notes that at times his SpO2 drops to the 80s.  Given the patient's history of both COPD and bronchiectasis, I elected to treat empirically for both. Prescribed steroid burst (10d, 40 mg x 5d, 30 mg x3d, 20 mg x2d) as well as Levaquin (x10d). Duration 2/19-3/1. Lower respiratory culture obtained since that time and shows pansensitive Pseudomonas.    This patient's respiratory decompensation is multifactorial with likely COPD exacerbation and bronchiectasis flare. His sx were previously well managed despite severity of disease per imaging and PFTs. Given this, strongly suspect decompensation related to loss of inhaler coverage in the new year. No longer able to afford [name] or Spiriva. Able to get albuterol.     Called pt 2/26 AM. Spoke with wife, [name]. Discussed that pt was continuing to feel dyspneic. Perhaps some improvement in sputum production since initiation of antibiotics. She relayed that nebulizers were not filled by [facility]. Have reached out to our pharmacy team to sort out what issue with nebulizers as well as to check on manufacturer's assistance for [name]/Spiriva.  _____________________  Benjaman Kindler, MD  Pulmonary & Critical Care Fellow

## 2023-12-05 DIAGNOSIS — J449 Chronic obstructive pulmonary disease, unspecified: Principal | ICD-10-CM

## 2023-12-05 DIAGNOSIS — J441 Chronic obstructive pulmonary disease with (acute) exacerbation: Principal | ICD-10-CM

## 2023-12-05 DIAGNOSIS — Z720 Tobacco use: Principal | ICD-10-CM

## 2023-12-05 MED ORDER — NICOTINE 21 MG/24 HR DAILY TRANSDERMAL PATCH
MEDICATED_PATCH | TRANSDERMAL | 2 refills | 28.00 days
Start: 2023-12-05 — End: ?

## 2023-12-05 MED ORDER — NICOTINE (POLACRILEX) 4 MG BUCCAL LOZENGE
LOZENGE | BUCCAL | 3 refills | 2.00 days | PRN
Start: 2023-12-05 — End: ?

## 2023-12-05 MED ORDER — BUDESONIDE 0.5 MG/2 ML SUSPENSION FOR NEBULIZATION
Freq: Two times a day (BID) | RESPIRATORY_TRACT | 2 refills | 30.00 days | Status: CP
Start: 2023-12-05 — End: 2024-03-04

## 2023-12-05 MED ORDER — IPRATROPIUM 0.5 MG-ALBUTEROL 3 MG (2.5 MG BASE)/3 ML NEBULIZATION SOLN
Freq: Two times a day (BID) | RESPIRATORY_TRACT | 1 refills | 45.00 days | Status: CP
Start: 2023-12-05 — End: 2024-03-04

## 2023-12-05 NOTE — Unmapped (Signed)
 Adult Pulmonary Specialty Clinic Pharmacist Note     Purpose: High copays for COPD medications. [name] manufacturer assistance currently in process with [employer], Spiriva pending proof of income from patient. Advair and nebs noted to have high copay.      December 05, 2023 11:09 AM: Phone call to Pharmacy ([facility] [place])  Spoke with pharmacy tech  Advair - $302/month (likely deductible since going through insurance, but unable to confirm in their system  Provided Part B ID in chart  Need a new prescription, because even though diagnosis was associated with Rx not showing up on physical prescription which is required by Medicare Part B     New scripts sent with Dx code in SIG.     December 05, 2023 3:45 PM: Phone call to Pharmacy ([facility] [place])  Spoke with pharmacy tech - unable to process through Dr. Janee Morn, bad prescriber ID   Advised to resend under different prescriber     Messages sent to Dr. Doralee Albino and Dr. Janee Morn to resend under alternate prescriber (recommend attending in case issue with multiple fellows). Likely nebs to be cheaper than inhalers, though unable to confirm exact price. Interim plan to use nebs once prescribed then switch to inhalers once manufacturer assistance hopefully approved - Also recommended to send [facility] as manufacturer assistance available to simplify regimen.     Total time spent: 40 minutes     Electronically signed:  Prince Solian, PharmD, BCACP, CPP  Clinical Pharmacist Practitioner  [facility] Adult Cystic Fibrosis/Pulmonary Clinic  [phone]

## 2023-12-08 DIAGNOSIS — J441 Chronic obstructive pulmonary disease with (acute) exacerbation: Principal | ICD-10-CM

## 2023-12-08 DIAGNOSIS — J449 Chronic obstructive pulmonary disease, unspecified: Principal | ICD-10-CM

## 2023-12-08 MED ORDER — NICOTINE 21 MG/24 HR DAILY TRANSDERMAL PATCH
MEDICATED_PATCH | TRANSDERMAL | 2 refills | 28.00 days | Status: CP
Start: 2023-12-08 — End: ?

## 2023-12-08 MED ORDER — NICOTINE (POLACRILEX) 4 MG BUCCAL LOZENGE
LOZENGE | BUCCAL | 3 refills | 2.00 days | Status: CP | PRN
Start: 2023-12-08 — End: ?
  Filled 2023-12-10: qty 72, 3d supply, fill #0

## 2023-12-08 MED ORDER — PANTOPRAZOLE 40 MG TABLET,DELAYED RELEASE
ORAL_TABLET | Freq: Two times a day (BID) | ORAL | 3 refills | 30.00 days | Status: CP
Start: 2023-12-08 — End: ?
  Filled 2023-12-10: qty 60, 30d supply, fill #0

## 2023-12-08 MED ORDER — BUDESONIDE 0.5 MG/2 ML SUSPENSION FOR NEBULIZATION
Freq: Two times a day (BID) | RESPIRATORY_TRACT | 11 refills | 30.00 days | Status: CP
Start: 2023-12-08 — End: 2024-12-07

## 2023-12-08 MED ORDER — IPRATROPIUM 0.5 MG-ALBUTEROL 3 MG (2.5 MG BASE)/3 ML NEBULIZATION SOLN
Freq: Four times a day (QID) | RESPIRATORY_TRACT | 11 refills | 30.00 days | Status: CP
Start: 2023-12-08 — End: 2024-12-07

## 2023-12-08 MED ORDER — BUDESONIDE 160 MCG-GLYCOPYR 9 MCG-FORMOT 4.8 MCG/ACTUATION HFA INHALER
Freq: Two times a day (BID) | RESPIRATORY_TRACT | 3 refills | 0.00 days | Status: CP
Start: 2023-12-08 — End: ?

## 2023-12-08 MED ORDER — DABIGATRAN ETEXILATE 150 MG CAPSULE
ORAL_CAPSULE | Freq: Two times a day (BID) | ORAL | 11 refills | 30.00 days | Status: CN
Start: 2023-12-08 — End: 2024-12-07

## 2023-12-08 NOTE — Unmapped (Signed)
 Refill request received for patient.      Medication Requested: Pradaxa 150mg  tablet  Last Office Visit: 11/24/2023   Next Office Visit: Visit date not found  Last Prescriber: Charlotta Newton    Nurse refill requirements met? Yes  If not met, why:     Sent to: Pharmacy per protocol  If sent to provider, which provider?:

## 2023-12-08 NOTE — Unmapped (Signed)
 Pharmacy requesting refill.  Last appointment on 11/26/2023.  Next appointment not yet scheduled.  Requested Prescriptions     Pending Prescriptions Disp Refills    pantoprazole (PROTONIX) 40 MG tablet 60 tablet 3     Sig: Take 1 tablet (40 mg total) by mouth Two (2) times a day.     RX routed to provider for review.

## 2023-12-08 NOTE — Unmapped (Addendum)
 Spoke with our pharmacy team regarding nebulizer and inhaler coverage for this patient. Apparently, nebulizer not filled due to prescriber (myself) not being recognized when script was processed through Medicare Part B. Regarding inhaler costs, Medicare deductible likely responsible for new financially prohibitive inhaler prices. [name] manufacter assistance pending. As of 2/28, John Crawford still needs to provide proof of income in order for pharmacy team to complete application for Spiriva.     Plan in the short term is fill of nebs by another provider (Dr. Garner Nash kindly placed order this AM). Will continue with manufacturer assistance applications and will additionally begin application for Breztri. Submitted script to our pharmacy.     Pt remains high risk for life threatening acute respiratory failure and certainly warrants triple therapy as soon as possible. LVM today with updates as above. As pt will have finished abx and steroids as of 3/1, encouraged in person provider evaluation if sx persistent. If worsening, encouraged presentation to nearest ED to ensure patient safety.  _____________________  Benjaman Kindler, MD  Pulmonary & Critical Care Fellow

## 2023-12-09 DIAGNOSIS — J441 Chronic obstructive pulmonary disease with (acute) exacerbation: Principal | ICD-10-CM

## 2023-12-09 DIAGNOSIS — J449 Chronic obstructive pulmonary disease, unspecified: Principal | ICD-10-CM

## 2023-12-09 DIAGNOSIS — B999 Unspecified infectious disease: Principal | ICD-10-CM

## 2023-12-09 DIAGNOSIS — J471 Bronchiectasis with (acute) exacerbation: Principal | ICD-10-CM

## 2023-12-09 MED ORDER — LEVOFLOXACIN 750 MG TABLET
ORAL_TABLET | Freq: Every day | ORAL | 0 refills | 7.00 days | Status: CP
Start: 2023-12-09 — End: ?
  Filled 2023-12-11: qty 7, 7d supply, fill #0

## 2023-12-09 MED ORDER — PREDNISONE 10 MG TABLET
ORAL_TABLET | Freq: Every day | ORAL | 1 refills | 30.00 days | Status: CP
Start: 2023-12-09 — End: ?
  Filled 2023-12-11: qty 30, 30d supply, fill #0

## 2023-12-09 MED ORDER — TOBRAMYCIN 300 MG/5 ML IN 0.225 % SODIUM CHLORIDE FOR NEBULIZATION
Freq: Two times a day (BID) | RESPIRATORY_TRACT | 0 refills | 28.00 days | Status: CP
Start: 2023-12-09 — End: 2024-01-06
  Filled 2023-12-11: qty 280, 28d supply, fill #0

## 2023-12-09 NOTE — Unmapped (Signed)
 Spoke with John Crawford, pt's wife this evening. She is sick with the flu and was in the ED overnight for high fevers. Is trying her best to isolate from John Crawford.     John Crawford himself continues to have shortness of breath. Suspect ongoing flare. We discussed plan to try Breztri iso severe uncontrolled COPD. Since our last chat, pt's wife was able to afford/pick-up duonebs and pulmicort. Instructed QID use while working on inhaler coverage. Starting 10 mg pred qd while flare ongoing.    Also discussed plan to treat PsA with hopes of eradication. Extending levaquin for additional 7 days as bridge to 1 mo inhaled tobra. Will repeat LRCx following completion of 1 mo tobramycin. Unclear status of airway clearance. Will discuss further next encounter.    Again, cautioned wife to call EMS if any worsening of SOB. While we are doing our very best to achieve symptom control, he remains high risk for life-threatening respiratory failure.    Plan discussed with team pharmacist, Prince Solian, and pulmonary attending, Dr. Doralee Albino, who are in agreement.    _____________________  John Kindler, MD  Pulmonary & Critical Care Fellow

## 2023-12-09 NOTE — Unmapped (Signed)
 Adult Pul[facility]ty Clinic Pharmacist Note     Purpose: Review interval plan while attempting to obtain coverage for inhalers through manufacturer assistance.     December 09, 2023 12:53 PM: Phone call to Patient  Attempted to call Mr. Reihl. Unable to reach, left voicemail with CPP call back number  Reviewed nebs in total around $74/month, but can be filled [facility]   Still working on manufacturer assistance    Total time spent: 5 minutes     Electronically signed:  Prince Solian, PharmD, Patsy [place], CPP  Clinical Pharmacist Practitioner  [facility] Adult Cystic Fibrosis/Pulmonary Clinic  [phone]    CC:   Heron Sabins, MD  Desria Denver Faster, RN (Pulmonology Fellows Clinic Nurse)

## 2023-12-09 NOTE — Unmapped (Signed)
 Adult Pulmonary Specialty Clinic Pharmacist Note     Purpose:     December 09, 2023 12:39 PM: Phone call to Pharmacy ([facility]: [phone])  Spoke with pharmacy staff.   Confirmed Pulmicort nebulizer solution was billed through medicare part B.  Pulmicort nebulizer solution copay is  $71.25  Duoneb copay is $4.46      Total time spent: 10 minutes     Electronically signed:  Lonell Grandchild  Student Pharmacist

## 2023-12-10 NOTE — Unmapped (Signed)
 [facility] Specialty Medication Onboarding    Specialty Medication: tobramycin  Prior Authorization: Approved   Financial Assistance: No - copay  <$25  Final Copay/Day Supply: $0 / 28    Insurance Restrictions: None     Notes to Pharmacist: levofloxacin $2.26, prednisone $2.21  Credit Card on File: yes    The triage team has completed the benefits investigation and has determined that the patient is able to fill this medication at [facility]. Please contact the patient to complete the onboarding or follow up with the prescribing physician as needed.

## 2023-12-11 MED ORDER — NEBULIZERS
11 refills | 0.00 days
Start: 2023-12-11 — End: ?

## 2023-12-11 MED FILL — LC PLUS MISC: 28 days supply | Qty: 1 | Fill #0

## 2023-12-11 NOTE — Unmapped (Signed)
 [place] [facility]    Patient Onboarding/Medication Counseling    John Crawford is a 68 y.o. male with bronchiectasis / pseudomonas lung infection who I am counseling today on initiation of therapy.  I am speaking to the patient's family member, wife [name]) .    Was a Nurse, learning disability used for this call? No    Verified patient's date of birth / HIPAA.    Specialty medication(s) to be sent: CF/Pulmonary/Asthma: Tobramycin 300mg /43mL, Pari nebulizer cup      Non-specialty medications/supplies to be sent: prednisone, levofloxacin and neb cup      Medications not needed at this time: Clinical cytogeneticist (financial assistance pending)         Tobi (tobramycin)    Medication & Administration     Dosage: Inhale 1 ampule (300mg ) via nebulizer every 12 hours for 28 days only.     Administration:   Inhale contents of ampule sitting or standing upright and breathing normally through the mouthpiece of the nebulizer until there is no longer any mist being produced.    Usually last medication taken when on several inhaled therapies.    Adherence/Missed dose instructions: If you miss a dose of tobramycin Inhalation and it is 6 hours or less from the time you usually take your dose, then take your dose as soon as you can, then resume your next dose at the usual time. Otherwise skip the dose, and resume at your next scheduled dose.    Goals of Therapy     To treat or control bacterial infection in lungs    Side Effects & Monitoring Parameters     Voice alterations, loss of voice  Throat irritation  Bronchospasm     The following side effects should be reported to the provider:  Tinnitus (ringing of the ears) or hearing loss    Contraindications, Warnings, & Precautions     Ototoxicity    Drug/Food Interactions     Medication list reviewed in Epic. The patient was instructed to inform the care team before taking any new medications or supplements. No drug interactions identified.      Storage, Handling Precautions, & Disposal Store in the refrigerator.  May be stored at room temperature for up to 28 days.      Current Medications (including OTC/herbals), Comorbidities and Allergies     Current Outpatient Medications   Medication Sig Dispense Refill    ADVAIR HFA 115-21 mcg/actuation inhaler Inhale 2 puffs by mouth twice daily 36 g 6    albuterol 2.5 mg /3 mL (0.083 %) nebulizer solution Inhale 3 mL (2.5 mg total) by nebulization Every four (4) hours. 540 mL 11    albuterol HFA 90 mcg/actuation inhaler Inhale 2 puffs every four (4) hours as needed for wheezing. 18 g 11    azithromycin (ZITHROMAX) 250 MG tablet Take 1 tablet (250 mg total) by mouth daily. 90 tablet 3    budesonide (PULMICORT) 0.5 mg/2 mL nebulizer solution Inhale 2 mL (0.5 mg total) by nebulization two (2) times a day. Dx code J44.1 120 mL 11    budesonide-glycopyr-formoterol (BREZTRI) 160-9-4.8 mcg/actuation HFAA Inhale 2 Inhalations two (2) times a day. 10.7 g 3    citalopram (CELEXA) 20 MG tablet Take 1 tablet by mouth once daily 90 tablet 0    dabigatran etexilate (PRADAXA) 150 mg capsule Take 1 capsule (150 mg total) by mouth two (2) times a day. 60 capsule 11    EPINEPHrine (EPIPEN) 0.3 mg/0.3 mL injection Inject 0.3 mL (  0.3 mg total) into the muscle as needed for anaphylaxis. For emergency use with [name] 2 each 1    ipratropium-albuterol (DUO-NEB) 0.5-2.5 mg/3 mL nebulizer Inhale 3 mL by nebulization every six (6) hours. Dx code J44.1 360 mL 11    levoFLOXacin (LEVAQUIN) 750 MG tablet Take 1 tablet (750 mg total) by mouth daily. 7 tablet 0    mepolizumab ([name]) 100 mg/mL [name] Inject the contents of 1 pen (100 mg) under the skin every twenty-eight (28) days. 1 mL 6    metoPROLOL succinate (TOPROL-XL) 50 MG 24 hr tablet Take 1 tablet (50 mg total) by mouth daily. 90 tablet 0    nicotine (NICODERM CQ) 21 mg/24 hr patch Place 1 patch on the skin daily. 28 patch 2    nicotine polacrilex (NICORETTE MINI) lozenge 4 MG Apply 1 lozenge (4 mg total) to cheek every hour as needed for smoking cessation. Weeks 1 to 6: 1 lozenge every 1 to 2 hours (maximum: 5 lozenges every 6 hours; 20 lozenges/day)  Weeks 7 to 9: 1 lozenge every 2 to 4 hours (maximum: 5 lozenges every 6 hours; 20 lozenges/day).  Weeks 10 to 12: 1 lozenge every 4 to 8 hours (maximum: 5 lozenges every 6 hours; 20 lozenges/day) 48 lozenge 3    ondansetron (ZOFRAN-ODT) 4 MG disintegrating tablet Dissolve 1 tablet (4 mg total) on the tongue every eight (8) hours as needed for nausea. 30 tablet 0    pantoprazole (PROTONIX) 40 MG tablet Take 1 tablet (40 mg total) by mouth Two (2) times a day. 60 tablet 3    predniSONE (DELTASONE) 10 MG tablet Take 1 tablet (10 mg total) by mouth daily. 30 tablet 1    rivaroxaban (XARELTO) 20 mg tablet Take 1 tablet (20 mg total) by mouth daily with evening meal. 90 tablet 0    sodium chloride 7% (PULMOSAL) 7 % Nebu Inhale 4 mL by nebulization two (2) times a day. 240 mL 6    tiotropium bromide (SPIRIVA RESPIMAT) 2.5 mcg/actuation inhalation mist Inhale 2 puffs by mouth daily. 4 g 11    tobramycin, PF, (TOBI) 300 mg/5 mL nebulizer solution Inhale 5 mL (300 mg total) by nebulization every twelve (12) hours for 28 days. 280 mL 0     No current facility-administered medications for this visit.       No Known Allergies    Patient Active Problem List   Diagnosis    COPD (chronic obstructive pulmonary disease) (CMS-HCC)    Tobacco use disorder    Alcohol use    Atrial flutter with rapid ventricular response (CMS-HCC)    Shortness of breath    Chest pain    History of atrial flutter    Bronchiectasis without complication (CMS-HCC)    Rhinovirus infection    Bipolar disorder (CMS-HCC)    Acute on chronic respiratory failure with hypoxia (CMS-HCC)    Eosinophilia    Hypereosinophilic syndrome    Atrial fibrillation and flutter (CMS-HCC)    PAC (premature atrial contraction)       Medication list has been reviewed and updated in Epic: Yes    Allergies have been reviewed and updated in Epic: Yes    Appropriateness of Therapy     Acute infections noted within Epic:  No active infections  Patient reported infection:  11/30/23 lower respiratory culture was positive for 2+ Smooth Pseudomonas aeruginosa that is susceptible to tobramycin     Is the medication and dose appropriate based on diagnosis, medication list,  comorbidities, allergies, medical history, patient???s ability to self-administer the medication, and therapeutic goals? Yes    Prescription has been clinically reviewed: Yes      Baseline Quality of Life Assessment      How many days over the past month did your bronchiectasis  keep you from your normal activities? For example, brushing your teeth or getting up in the morning. Patient declined to answer    Financial Information     Medication Assistance provided: Prior Authorization    Anticipated copay of $0 / 28 days reviewed with patient. Verified delivery address.    Delivery Information     Scheduled delivery date: 12/12/23    Expected start date: 12/12/23    Medication will be delivered via UPS to the prescription address in [facility].  This shipment will not require a signature.      Explained the services we provide at [facility] and that each month we would call to set up refills.  Stressed importance of returning phone calls so that we could ensure they receive their medications in time each month.  Informed patient that we should be setting up refills 7-10 days prior to when they will run out of medication.  A pharmacist will reach out to perform a clinical assessment periodically.  Informed patient that a welcome packet, containing information about our pharmacy and other support services, a Notice of Privacy Practices, and a drug information handout will be sent.      The patient or caregiver noted above participated in the development of this care plan and knows that they can request review of or adjustments to the care plan at any time.      Patient or caregiver verbalized understanding of the above information as well as how to contact the pharmacy at [phone] option 4 with any questions/concerns.  The pharmacy is open Monday through Friday 8:30am-4:30pm.  A pharmacist is available 24/7 via pager to answer any clinical questions they may have.    Patient Specific Needs     Does the patient have any physical, cognitive, or cultural barriers? No    Does the patient have adequate living arrangements? (i.e. the ability to store and take their medication appropriately) Yes    Did you identify any home environmental safety or security hazards? No    Patient prefers to have medications discussed with  Family Member     Is the patient or caregiver able to read and understand education materials at a high school level or above? Yes    Patient's primary language is  English     Is the patient high risk? No    Does the patient have an additional or emergency contact listed in their chart? Yes    SOCIAL DETERMINANTS OF HEALTH     At the [facility], we have learned that life circumstances - like trouble affording food, housing, utilities, or transportation can affect the health of many of our patients.   That is why we wanted to ask: are you currently experiencing any life circumstances that are negatively impacting your health and/or quality of life? Patient declined to answer    Social Drivers of Health     Food Insecurity: No Food Insecurity (12/15/2019)    Hunger Vital Sign     Worried About Running Out of Food in the Last Year: Never true     Ran Out of Food in the Last Year: Never true   Internet Connectivity:  Not on file   Housing/Utilities: Low Risk  (03/21/2020)    Housing/Utilities     Within the past 12 months, have you ever stayed: outside, in a car, in a tent, in an overnight shelter, or temporarily in someone else's home (i.e. couch-surfing)?: No     Are you worried about losing your housing?: No     Within the past 12 months, have you been unable to get utilities (heat, electricity) when it was really needed?: No   Tobacco Use: High Risk (11/24/2023)    Patient History     Smoking Tobacco Use: Every Day     Smokeless Tobacco Use: Never     Passive Exposure: Not on file   Transportation Needs: No Transportation Needs (03/21/2020)    PRAPARE - Transportation     Lack of Transportation (Medical): No     Lack of Transportation (Non-Medical): No   Alcohol Use: Not At Risk (11/16/2020)    Alcohol Use     How often do you have a drink containing alcohol?: Never     How many drinks containing alcohol do you have on a typical day when you are drinking?: 1 - 2     How often do you have 5 or more drinks on one occasion?: Never   Interpersonal Safety: Not on file   Physical Activity: Not on file   Intimate Partner Violence: Not At Risk (11/06/2020)    Humiliation, Afraid, Rape, and Kick questionnaire     Fear of Current or Ex-Partner: No     Emotionally Abused: No     Physically Abused: No     Sexually Abused: No   Stress: Not on file   Substance Use: Not on file (08/11/2023)   Social Connections: Not on file   Financial Resource Strain: Low Risk  (10/26/2020)    Overall Financial Resource Strain (CARDIA)     Difficulty of Paying Living Expenses: Not hard at all   Depression: Not at risk (11/08/2022)    PHQ-2     PHQ-2 Score: 0   Health Literacy: Not on file       Would you be willing to receive help with any of the needs that you have identified today? Not applicable       [name], PharmD  [facility] Specialty Pharmacist

## 2023-12-16 DIAGNOSIS — J449 Chronic obstructive pulmonary disease, unspecified: Principal | ICD-10-CM

## 2023-12-16 DIAGNOSIS — J441 Chronic obstructive pulmonary disease with (acute) exacerbation: Principal | ICD-10-CM

## 2023-12-16 DIAGNOSIS — R110 Nausea: Principal | ICD-10-CM

## 2023-12-16 MED ORDER — DABIGATRAN ETEXILATE 150 MG CAPSULE
ORAL_CAPSULE | Freq: Two times a day (BID) | ORAL | 11 refills | 30 days | Status: CP
Start: 2023-12-16 — End: 2024-12-15
  Filled 2023-12-18: qty 60, 30d supply, fill #0

## 2023-12-16 MED ORDER — ONDANSETRON 4 MG DISINTEGRATING TABLET
ORAL_TABLET | Freq: Three times a day (TID) | ORAL | 1 refills | 10.00 days | Status: CP | PRN
Start: 2023-12-16 — End: 2024-08-31
  Filled 2023-12-17: qty 30, 10d supply, fill #0

## 2023-12-16 MED ORDER — PREDNISONE 20 MG TABLET
ORAL_TABLET | 0 refills | 0.00 days
Start: 2023-12-16 — End: ?

## 2023-12-16 NOTE — Unmapped (Signed)
 Refill request received for patient.      Medication Requested: Dabigatran etexilate (Pradaxa) 150 mg tablet  Last Office Visit: 11/24/2023   Next Office Visit: Visit date not found  Last Prescriber: Charlotta Newton    Nurse refill requirements met? Yes  If not met, why:     Sent to: Pharmacy per protocol  If sent to provider, which provider?:

## 2023-12-17 MED ORDER — PREDNISONE 20 MG TABLET
ORAL_TABLET | 0 refills | 0.00 days
Start: 2023-12-17 — End: ?

## 2023-12-17 MED FILL — AZITHROMYCIN 250 MG TABLET: ORAL | 90 days supply | Qty: 90 | Fill #1

## 2023-12-23 NOTE — Unmapped (Signed)
 Internal Medicine Clinic Visit    Reason for visit: follow up    A/P:    1. Longstanding persistent atrial fibrillation (CMS-HCC)    2. Chronic obstructive pulmonary disease, unspecified COPD type (CMS-HCC)    3. Bronchiectasis without complication (CMS-HCC)    4. Sleep apnea, unspecified type    5. Tobacco use disorder        3. Acute Hypoxic Respiratory Failure - Chronic obstructive pulmonary disease - Bronchiectasis   Severe. Pulse ox with good pleth noting 76% on room air. Endorsing progressive DOE and SOB since January 2025. Last hospitalization 2022. Follows with [facility]. On oxygen at night (2.5L) and now using during the day. Multiple medications have become cost prohibitive, notably no longer taking [name]. Recently completed treatment for a COPD/Bronchiectasis flare with pulse dose steroids and levaquin. Now on inhaled tobramycin in an effort to eradicate PsA. Has not noticed any benefit since treating. Possible that he came down with a URI in the past two weeks as his wife recently tested positive for the flu. LDCT 05/26/23 LRADS1. Will need yearly screening.     We had a detailed discussion in clinic this morning that my recommendation is to go to the ER by ambulance. I explained that he is severely hypoxic and without proper therapy and oxygenation he is at risk for lethal arrhythmias, infection, mucus plugging, and more. I told him that I worry without proper treatment he is at high risk of dying, possibly within the next hours to days. I explained the benefits of presenting to the hospital for COPD treatment, infectious workup, and need for oxygen and potential intubation. Despite this, Mr. Langdon stated that he would like to go home and work on his tractor. He acknowledged that he could die in the hours to come should he not receive treatment. His wife was in the room and expressed frustration in his decision but states that this is how he has been over the past few years. He was deemed to have capacity to make this decision. He stated that once he finished working on his tractor, he would plan to present to the hospital. Multiple efforts were made to have him transported by BLS to the ER from but he stated that his decision was final and he would not be going understanding that this is against medical advice. Code status confirmed DNR/DNI. He agreed to a DuoNeb and a dose of IM Solumedrol before leaving clinic today. He would be interested in a Palliative Care referral moving forward.     - Patient has capacity to make this decision  - He understands he is going against medical advice  - IV Solumedrol 125 mg in clinic  - DuoNeb x 1 in clinic  - Recommend presenting to the hospital as soon as possible  [facility] Pulmonology   - s/p prolonged course of levaquin for bronchiectasis exacerbation  - Start prednisone taper   - Inhaled tobramycin BID x 28 days  - Cont. Pulmicort BID  - Cont. Breztri BID  - No longer on Advair BID  - Cont. Albuterol PRN  - Cont. Azithromycin daily  - Cont. Duonebs PRN  - Continue mepolizumab -> difficulty affording (~1500 dollars a month)    Not discussed this visit:  Sleep apnea, unspecified type  STOP BANG 5  - Polysomnography ordered, unable to contact patient     Tobacco use disorder  Patient not interested in cessation. Currently smoking 1 ppd. Previously smoked 3-4 packs per day.  LDCT completed 05/26/23 as above. Will need repeat in 2025  - Plan for repeat LDCT in 2025    Longstanding persistent atrial fibrillation (CMS-HCC) (Primary)  S/p unsuccessful ablation in March 2021. DOACs have become cost prohibitive. Ongoing discussions with Cardiology regarding utility of DOAC moving forward pending results from [facility] in Feb 25'. In sinus rhythm today.   - No longer on Xarelto   - Cont. Dabigatran 150 mg bid  - Metoprolol 50 mg daily   - Follows with [facility]     Hx of Reduced EF  H/o HFrEF in 2021 in the setting of atrial flutter with RVR. Repeat Echo's in years thereafter with LVEF > 55%. Follows with [facility] and felt not to have the diagnosis of congestive heart failure. Symptoms of progressive DOE and SOB felt to be related to COPD and bronchiectasis as below.   - Continue metoprolol as above.       Return in about 3 months (around 03/25/2024) for In-person.    Staffed with Dr. 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  Here for follow-up and medication refill.    Since our last visit in Nov 24' Mr Ledford saw Dr. Domingo Madeira on 2/17 in Cardiology clinic - deemed that previously reduced EF was in the setting of Aflutter RVR and overall he does not clinically have heart failure. Suspect his lung disease is the culprit for his worsening dyspnea and functional status. Regarding his Aflutter, the cost of DOACs has become prohibitive and he was sent out on a Zio to help direct further utility of DOACs moving forward.     He was then seen in Pulmonology clinic on 2/19 by Dr. Janee Morn - felt to be in an acute COPD exacerbation and was rx a steroid burst taper in addition to treating a possible bronchiectasis exacerbation with a 10 day course of levaquin. During their visit together they also discussed OSA, tobacco dependency, lung cancer screening and vaccinations.     He then called Dr. Carollee Massed office on 3/4 with persistent SOB. Was prescribed Breztri and had his course of levaquin extended with a goal to bridge to inhaled tobramycin for PsA eradication.     Today, he is feeling worse. His wife had the flu two weeks ago and thinks he may have caught it. He had fevers at that time. Can't do much at home these days from shortness of breath. Is out of breath nearly 100% of the day.      The burst of prednisone was helpful but wore off immediately after. Continues to wear 2.5L oxygen at night and is now using during the day. No longer using advair. Breztri should be coming today. Has not taken [name] since December as it is cost prohibitive. Taking prednisone 10 mg daily and inhaled tobraymycin. Feels like he is doing worse since no longer taking [name].     Not coughing as much. Sputum has largely been clear/white not yellow.     Continues to smoke cigarettes. Working on quitting.     He works as a Curator on a farm.  __________________________________________________________    Medications:  Reviewed in EPIC  __________________________________________________________    Physical Exam:   Vital Signs:  Vitals:    12/24/23 0835   BP: 127/62   BP Site: L Arm   BP  Position: Sitting   BP Cuff Size: Medium   Pulse: 72   Resp: 12   Temp: 36.3 ??C (97.3 ??F)   TempSrc: Temporal   SpO2: 90%   Weight: 77.3 kg (170 lb 6.4 oz)   Height: 175.3 cm (5' 9.02)        Gen: Ill appearing, pale, working hard to breath  Mouth: edentulous on the top, diffuse periodontal disease   CV: RRR, no murmurs, no JVD  Pulm: Diffuse end expiratory wheezes, minimal air movement bilaterally, occasional cough, speaking in short sentences  Ext: No edema    Medication adherence and barriers to the treatment plan have been addressed. Opportunities to optimize healthy behaviors have been discussed. Patient / caregiver voiced understanding.    Daleen Bo, MD   [facility] Internal Medicine PGY-2

## 2023-12-24 ENCOUNTER — Ambulatory Visit: Admit: 2023-12-24 | Discharge: 2023-12-25 | Payer: MEDICARE

## 2023-12-24 DIAGNOSIS — G4730 Sleep apnea, unspecified: Principal | ICD-10-CM

## 2023-12-24 DIAGNOSIS — I4811 Longstanding persistent atrial fibrillation: Principal | ICD-10-CM

## 2023-12-24 DIAGNOSIS — J449 Chronic obstructive pulmonary disease, unspecified: Principal | ICD-10-CM

## 2023-12-24 DIAGNOSIS — J479 Bronchiectasis, uncomplicated: Principal | ICD-10-CM

## 2023-12-24 DIAGNOSIS — F17200 Nicotine dependence, unspecified, uncomplicated: Principal | ICD-10-CM

## 2023-12-24 LAB — COMPREHENSIVE METABOLIC PANEL
ALBUMIN: 3.7 g/dL (ref 3.4–5.0)
ALKALINE PHOSPHATASE: 73 U/L (ref 46–116)
ALT (SGPT): 13 U/L (ref 10–49)
ANION GAP: 8 mmol/L (ref 5–14)
AST (SGOT): 16 U/L (ref <=34)
BILIRUBIN TOTAL: 0.7 mg/dL (ref 0.3–1.2)
BLOOD UREA NITROGEN: 22 mg/dL (ref 9–23)
BUN / CREAT RATIO: 31
CALCIUM: 9.7 mg/dL (ref 8.7–10.4)
CHLORIDE: 98 mmol/L (ref 98–107)
CO2: 34.7 mmol/L — ABNORMAL HIGH (ref 20.0–31.0)
CREATININE: 0.7 mg/dL — ABNORMAL LOW (ref 0.73–1.18)
EGFR CKD-EPI (2021) MALE: >90 mL/min/{1.73_m2} (ref >=60)
GLUCOSE RANDOM: 156 mg/dL (ref 70–179)
POTASSIUM: 5 mmol/L — ABNORMAL HIGH (ref 3.4–4.8)
PROTEIN TOTAL: 7.4 g/dL (ref 5.7–8.2)
SODIUM: 141 mmol/L (ref 135–145)

## 2023-12-24 LAB — BLOOD GAS, VENOUS
BASE EXCESS VENOUS: 11 — ABNORMAL HIGH (ref -2.0–2.0)
HCO3 VENOUS: 33 mmol/L — ABNORMAL HIGH (ref 22–27)
O2 SATURATION VENOUS: 77.6 % (ref 40.0–85.0)
PCO2 VENOUS: 67 mmHg (ref 40–60)
PH VENOUS: 7.34 (ref 7.32–7.43)
PO2 VENOUS: 48 mmHg — ABNORMAL HIGH (ref 35–40)

## 2023-12-24 LAB — HIGH SENSITIVITY TROPONIN I - 2 HOUR SERIAL
HIGH SENSITIVITY TROPONIN - DELTA (0-2H): 0 ng/L (ref <=7)
HIGH-SENSITIVITY TROPONIN I - 2 HOUR: 3 ng/L (ref <=53)

## 2023-12-24 LAB — CBC W/ AUTO DIFF
BASOPHILS ABSOLUTE COUNT: 0 10*9/L (ref 0.0–0.1)
BASOPHILS RELATIVE PERCENT: 0.4 %
EOSINOPHILS ABSOLUTE COUNT: 0 10*9/L (ref 0.0–0.5)
EOSINOPHILS RELATIVE PERCENT: 0 %
HEMATOCRIT: 32.5 % — ABNORMAL LOW (ref 39.0–48.0)
HEMOGLOBIN: 10 g/dL — ABNORMAL LOW (ref 12.9–16.5)
LYMPHOCYTES ABSOLUTE COUNT: 0.7 10*9/L — ABNORMAL LOW (ref 1.1–3.6)
LYMPHOCYTES RELATIVE PERCENT: 11.7 %
MEAN CORPUSCULAR HEMOGLOBIN CONC: 30.6 g/dL — ABNORMAL LOW (ref 32.0–36.0)
MEAN CORPUSCULAR HEMOGLOBIN: 23.2 pg — ABNORMAL LOW (ref 25.9–32.4)
MEAN CORPUSCULAR VOLUME: 75.9 fL — ABNORMAL LOW (ref 77.6–95.7)
MEAN PLATELET VOLUME: 8.6 fL (ref 6.8–10.7)
MONOCYTES ABSOLUTE COUNT: 0.2 10*9/L — ABNORMAL LOW (ref 0.3–0.8)
MONOCYTES RELATIVE PERCENT: 3.4 %
NEUTROPHILS ABSOLUTE COUNT: 5.2 10*9/L (ref 1.8–7.8)
NEUTROPHILS RELATIVE PERCENT: 84.5 %
NUCLEATED RED BLOOD CELLS: 0 /100{WBCs} (ref <=4)
PLATELET COUNT: 447 10*9/L (ref 150–450)
RED BLOOD CELL COUNT: 4.29 10*12/L (ref 4.26–5.60)
RED CELL DISTRIBUTION WIDTH: 21.3 % — ABNORMAL HIGH (ref 12.2–15.2)
WBC ADJUSTED: 6.2 10*9/L (ref 3.6–11.2)

## 2023-12-24 LAB — PROTIME-INR
INR: 1.05
PROTIME: 12 s (ref 9.9–12.6)

## 2023-12-24 LAB — SLIDE REVIEW

## 2023-12-24 LAB — HIGH SENSITIVITY TROPONIN I - SERIAL: HIGH SENSITIVITY TROPONIN I: 3 ng/L (ref <=53)

## 2023-12-24 MED ORDER — PREDNISONE 20 MG TABLET
ORAL_TABLET | ORAL | 0 refills | 17.00 days | Status: CP
Start: 2023-12-24 — End: 2024-01-10

## 2023-12-24 MED ADMIN — ipratropium-albuterol (DUO-NEB) 0.5-2.5 mg/3 mL nebulizer solution 3 mL: 3 mL | RESPIRATORY_TRACT | @ 14:00:00 | Stop: 2023-12-24

## 2023-12-24 MED ADMIN — methylPREDNISolone sodium succinate (PF) (SOLU-Medrol) injection 125 mg: 125 mg | INTRAVENOUS | @ 14:00:00 | Stop: 2023-12-24

## 2023-12-24 NOTE — Unmapped (Addendum)
 Pt expresses interest in hospice, advised to contact us if he wants referral. Reviewed scope of hospice services.  Goals of care discussion. He would not want to be rescusitated  its in gods hands, but would consider intubation alone if he were in respiratory failure with reasonable change of some improvement.      He was able to state independently that we were recommending a higher level of care due to his breathing, low 02, increasing C02 retention. He stated independently that he was aware that returning home increased his risk of MI, cardiac arrest, progressive respiratory failure and death. It is his priority to return home at this time, wife driving, to work on his tractor and then he plans to present to ED, would prefer [facility] given closer to home. Offered EMS services at home, he declined due to cost.  Wife with patient, support provided she will bring him home. Confirmed their contact information.      IV solumedrol and nebs given in clinic for harm reduction for presumed COPD exacerbation off of biologics.     Oral prednisone taper given for harm reduction, he is aware it is our advice he present to hospital.         I saw and evaluated the patient, participating in the key portions of the service.  I reviewed the resident???s note.  I agree with the resident???s findings and plan. Jamie Kato, MD

## 2023-12-24 NOTE — Unmapped (Addendum)
 It was a pleasure seeing you in clinic today. We have made the following plan.      We STRONGLY recommend going to the Emergency Department    We are worried about your lungs and breathing.     While it is our medical advice that you go to the hospital now, you have expressed your wishes not to follow this advice and understand that your condition is life threatening.     You were given IV steroids, some oxygen and a nebulizer.     We recommend presenting  to the hospital today, either the nearest hospital, [facility] or [facility]. It will be safest to go by ambulance.     You have expressed a desire not to receive CPR, cardiac resuscitation or intubation for breathing support.  Carry the pink MOST form with you to ensure that first responders are aware of your wishes.     If you do not go to the hospital, come see [place] in close follow up.     Hospice at home may be something in keeping with your wishes.  This would enable you to have care in the home to increase your comfort rather than focus on prolonging life.  Please let us now if you would like [place] to set this up.

## 2023-12-24 NOTE — Unmapped (Signed)
 [place] [facility] at [facility]     Reason for visit: Follow up    Questions / Concerns that need to be addressed: has been experiencing SOB lately.    Screening BP- 127/62 : 72    [employer] BPs (complete if screening BP has a systolic  > 130 or diastolic > 80)  BP#1 -   BP#2 -  BP#3 -    Average BP -  (please note this as a comment in vitals)     PTHomeBP     Diabetes:  Regularly checking blood sugars?: no  If yes, when? Complete log for past 7 days  Date Before Breakfast After Breakfast Before Lunch After Lunch Before Dinner After Dinner Before [employer] or Libre CGM in use? If so, pull appropriate reporting through portal ([employer]) or EPIC order Josephine Igo).    HCDM reviewed and updated in Epic:    We are working to make sure all of our patients??? wishes are updated in Epic and part of that is documenting a Environmental health practitioner for each patient  A Health Care Decision Maker is someone you choose who can make health care decisions for you if you are not able - who would you most want to do this for you????  is already up to date.    HCDM (patient stated preference): John, Crawford - Spouse - [phone]    BPAs completed:  N/A     Annual Screenings:   N/A  __________________________________________________________________________________________    SCREENINGS COMPLETED IN FLOWSHEETS      AUDIT       PHQ2       PHQ9          GAD7       COPD Assessment       Falls Risk

## 2023-12-24 NOTE — Unmapped (Signed)
 Patient via WC to triage. Reports shortness of breath and chest discomfort. Hx of COPD, emphysema.   87% on RA, placed on O2 for comfort - 92% on 2L.

## 2023-12-25 ENCOUNTER — Emergency Department
Admit: 2023-12-25 | Discharge: 2023-12-25 | Disposition: A | Discharge status: Home / Self Care | Payer: MEDICARE | Attending: Student in an Organized Health Care Education/Training Program

## 2023-12-25 MED ORDER — PREDNISONE 20 MG TABLET
ORAL_TABLET | ORAL | 0 refills | 12.00 days | Status: CP
Start: 2023-12-25 — End: 2024-01-06
  Filled 2024-01-20: qty 30, 30d supply, fill #1

## 2023-12-25 MED ORDER — ALBUTEROL SULFATE CONCENTRATE 5 MG/ML(0.5 %) SOLUTION FOR NEBULIZATION
Freq: Four times a day (QID) | RESPIRATORY_TRACT | 12 refills | 10.00 days | Status: CP | PRN
Start: 2023-12-25 — End: 2024-12-24

## 2023-12-25 MED ORDER — CIPROFLOXACIN 500 MG TABLET
ORAL_TABLET | Freq: Two times a day (BID) | ORAL | 0 refills | 14.00 days | Status: CP
Start: 2023-12-25 — End: 2024-01-08

## 2023-12-25 MED ADMIN — albuterol 2.5 mg /3 mL (0.083 %) nebulizer solution 5 mg: 5 mg | RESPIRATORY_TRACT | @ 04:00:00 | Stop: 2023-12-25

## 2023-12-25 MED ADMIN — dabigatran etexilate (PRADAXA) capsule 150 mg: 150 mg | ORAL | @ 07:00:00 | Stop: 2023-12-25

## 2023-12-25 MED ADMIN — levoFLOXacin (LEVAQUIN) tablet 750 mg: 750 mg | ORAL | @ 10:00:00 | Stop: 2023-12-25

## 2023-12-25 MED ADMIN — metoPROLOL succinate (Toprol-XL) 24 hr tablet 50 mg: 50 mg | ORAL | @ 13:00:00 | Stop: 2023-12-25

## 2023-12-25 MED ADMIN — albuterol 2.5 mg /3 mL (0.083 %) nebulizer solution 2.5 mg: 2.5 mg | RESPIRATORY_TRACT | @ 16:00:00 | Stop: 2023-12-25

## 2023-12-25 MED ADMIN — ipratropium (ATROVENT) 0.02 % nebulizer solution 500 mcg: 500 ug | RESPIRATORY_TRACT | @ 04:00:00 | Stop: 2023-12-25

## 2023-12-25 MED ADMIN — albuterol 2.5 mg /3 mL (0.083 %) nebulizer solution 2.5 mg: 2.5 mg | RESPIRATORY_TRACT | @ 19:00:00 | Stop: 2023-12-25

## 2023-12-25 MED ADMIN — predniSONE (DELTASONE) tablet 40 mg: 40 mg | ORAL | @ 10:00:00 | Stop: 2023-12-25

## 2023-12-25 MED ADMIN — sodium chloride 3 % NEBULIZER solution 4 mL: 4 mL | RESPIRATORY_TRACT | @ 05:00:00 | Stop: 2023-12-25

## 2023-12-25 MED ADMIN — sodium chloride 3 % NEBULIZER solution 4 mL: 4 mL | RESPIRATORY_TRACT | @ 12:00:00 | Stop: 2023-12-25

## 2023-12-25 MED ADMIN — ipratropium (ATROVENT) 0.02 % nebulizer solution 500 mcg: 500 ug | RESPIRATORY_TRACT | @ 03:00:00 | Stop: 2023-12-25

## 2023-12-25 MED ADMIN — ipratropium-albuterol (DUO-NEB) 0.5-2.5 mg/3 mL nebulizer solution 3 mL: 3 mL | RESPIRATORY_TRACT | @ 19:00:00 | Stop: 2023-12-25

## 2023-12-25 MED ADMIN — fluticasone furoate-vilanterol (BREO ELLIPTA) 200-25 mcg/dose inhaler 1 puff: 1 | RESPIRATORY_TRACT | @ 12:00:00 | Stop: 2023-12-25

## 2023-12-25 MED ADMIN — budesonide (PULMICORT) nebulizer solution 0.5 mg: .5 mg | RESPIRATORY_TRACT | @ 12:00:00 | Stop: 2023-12-25

## 2023-12-25 MED ADMIN — albuterol 2.5 mg /3 mL (0.083 %) nebulizer solution 2.5 mg: 2.5 mg | RESPIRATORY_TRACT | @ 08:00:00 | Stop: 2023-12-25

## 2023-12-25 MED ADMIN — albuterol 2.5 mg /3 mL (0.083 %) nebulizer solution 5 mg: 5 mg | RESPIRATORY_TRACT | @ 03:00:00 | Stop: 2023-12-25

## 2023-12-25 MED ADMIN — albuterol 2.5 mg /3 mL (0.083 %) nebulizer solution 2.5 mg: 2.5 mg | RESPIRATORY_TRACT | @ 12:00:00 | Stop: 2023-12-25

## 2023-12-25 MED ADMIN — umeclidinium (INCRUSE ELLIPTA) 62.5 mcg/actuation inhaler 1 puff: 1 | RESPIRATORY_TRACT | @ 12:00:00 | Stop: 2023-12-25

## 2023-12-25 MED ADMIN — dabigatran etexilate (PRADAXA) capsule 150 mg: 150 mg | ORAL | @ 13:00:00 | Stop: 2023-12-25

## 2023-12-25 MED ADMIN — nicotine (NICODERM CQ) 21 mg/24 hr patch 1 patch: 1 | TRANSDERMAL | @ 07:00:00 | Stop: 2023-12-25

## 2023-12-25 MED ADMIN — tobramycin (PF) (TOBI) 300 mg/5 mL nebulizer solution 300 mg: 300 mg | RESPIRATORY_TRACT | @ 12:00:00 | Stop: 2023-12-25

## 2023-12-25 MED ADMIN — ipratropium-albuterol (DUO-NEB) 0.5-2.5 mg/3 mL nebulizer solution 3 mL: 3 mL | RESPIRATORY_TRACT | @ 14:00:00 | Stop: 2023-12-25

## 2023-12-25 NOTE — Unmapped (Signed)
 ED Progress Note    Received signout pending admission. Hospitalist evaluated patient who does not think patient requires admission, he was also evaluated pulmonology who gave recommendations. Patient would like to go home as well.    On exam, no tachypnea, does have some expiratory wheezing, labs without acute respiratory acidosis, o2 98% on 2L [place] which is his baseline oxygen use at home. Has nebulizer at home.    Believe discharge reasonable given has nebulizer, oxygen at home. Will discharge with albuterol refills and instructions to use nebulizer every 4-6 hours at home, ciprofloxacin and prednisone taper per pulmonology recommendations. Patient comfortable with plan. Will also place referral to pulmonary rehab. Discharged in stable condition.

## 2023-12-25 NOTE — Unmapped (Signed)
 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 y/o male that presents to [facility] with COPD (chronic obstructive pulmonary disease) (CMS-HCC).  Pt was seen at the request of Clovis Fredrickson, MD (Emergency Medicine) in consultation for Acute exacerbation of COPD    Acute exacerbation COPD  Current smoker with advanced COPD on home oxygen 2 L  Productive cough with yellowish sputum, and dyspnea  No leukocytosis or consolidation, RPP negative  Seen by pulmonary service earlier today with the following recommendations:  Prednisone taper: 40mg  for 3 days, 30mg  for 3 days, 20mg  for 3 days, 10mg  for 3 days  Recommend continuing home inhalers  Scheduled Duonebs Q4-6H  Recommend 14 day course of ciprofloxacin to help with pseudomonal clearance.   Recommend good pulmonary clearance while in hospital  Would add hypertonic saline to help with airway clearance.   Would continue inhaled tobramycin BID for the scheduled 28 days   Recommend referral to pulmonary rehab as an outpatient.   At this time the patient does not wish to be admitted, and feels all of these interventions can be done at home.    Thank you for this consult.  We will sign off at this time, please re-consult should any questions arise in the future.  ___________________________________________________________________    Subjective:   Dyspnea more on exertion, productive cough with yellowish sputum.    Objective:   Temp:  [36.2 ??C (97.1 ??F)-37.1 ??C (98.7 ??F)] 37.1 ??C (98.7 ??F)  Heart Rate:  [75-83] 83  SpO2 Pulse:  [64-96] 96  Resp:  [16-24] 16  BP: (106-128)/(51-104) 116/61  SpO2:  [92 %-97 %] 97 %  Body mass index is 31.09 kg/m??.    General: Cooperative, alert, no acute distress.  HEENT: Oropharynx clear. Moist mucous membranes. PERRLA, EOMI. No icterus.  Lungs: Normal work of breathing.  Diffuse wheezes.  Heart: No murmurs, rubs, or gallops.  Regular rate and rhythm.  Abdomen: Soft, non-tender. BS normal.  CNS: Alert and oriented. No focal deficit   Skin: No rashes or lesions.  Extremities: No deformities, edema, or discoloration.  Peripheral pulses: Normal, capilliary refill < 2 seconds.      Labs/Studies/Diagnostics:   Data Review:  All lab results last 24 hours:    Recent Results (from the past 24 hours)   ECG 12 Lead    Collection Time: 12/24/23  8:47 PM   Result Value Ref Range    EKG Systolic BP  mmHg    EKG Diastolic BP  mmHg    EKG Ventricular Rate 81 BPM    EKG Atrial Rate 81 BPM    EKG P-R Interval 134 ms    EKG QRS Duration 138 ms    EKG Q-T Interval 408 ms    EKG QTC Calculation 473 ms    EKG Calculated P Axis 80 degrees    EKG Calculated R Axis -22 degrees    EKG Calculated T Axis 52 degrees    QTC Fredericia 450 ms   Comprehensive Metabolic  Panel    Collection Time: 12/24/23  8:54 PM   Result Value Ref Range    Sodium 141 135 - 145 mmol/L    Potassium 5.0 (H) 3.4 - 4.8 mmol/L    Chloride 98 98 - 107 mmol/L    CO2 34.7 (H) 20.0 - 31.0 mmol/L    Anion Gap 8 5 - 14 mmol/L    BUN 22 9 - 23 mg/dL    Creatinine 1.61 (L) 0.73 - 1.18 mg/dL    BUN/Creatinine Ratio 31     eGFR CKD-EPI (2021) Male >90 >=60 mL/min/1.64m2    Glucose 156 70 - 179 mg/dL    Calcium 9.7 8.7 - 09.6 mg/dL    Albumin 3.7 3.4 - 5.0 g/dL    Total Protein 7.4 5.7 - 8.2 g/dL    Total Bilirubin 0.7 0.3 - 1.2 mg/dL    AST 16 <=04 U/L    ALT 13 10 - 49 U/L    Alkaline Phosphatase 73 46 - 116 U/L   PT-INR    Collection Time: 12/24/23  8:54 PM   Result Value Ref Range    PT 12.0 9.9 - 12.6 sec    INR 1.05    hsTroponin I (serial 0-2-6H w/ delta)    Collection Time: 12/24/23  8:54 PM   Result Value Ref Range    hsTroponin I 3 <=53 ng/L   CBC w/ Differential    Collection Time: 12/24/23  8:54 PM   Result Value Ref Range    WBC 6.2 3.6 - 11.2 10*9/L    RBC 4.29 4.26 - 5.60 10*12/L    HGB 10.0 (L) 12.9 - 16.5 g/dL    HCT 54.0 (L) 98.1 - 48.0 %    MCV 75.9 (L) 77.6 - 95.7 fL    MCH 23.2 (L) 25.9 - 32.4 pg    MCHC 30.6 (L) 32.0 - 36.0 g/dL    RDW 19.1 (H) 47.8 - 15.2 %    MPV 8.6 6.8 - 10.7 fL    Platelet 447 150 - 450 10*9/L    nRBC 0 <=4 /100 WBCs    Neutrophils % 84.5 %    Lymphocytes % 11.7 %    Monocytes % 3.4 %    Eosinophils % 0.0 %    Basophils % 0.4 %    Absolute Neutrophils 5.2 1.8 - 7.8 10*9/L    Absolute Lymphocytes 0.7 (L) 1.1 - 3.6 10*9/L    Absolute Monocytes 0.2 (L) 0.3 - 0.8 10*9/L    Absolute Eosinophils 0.0 0.0 - 0.5 10*9/L    Absolute Basophils 0.0 0.0 - 0.1 10*9/L    Microcytosis Slight (A) Not Present    Anisocytosis Marked (A) Not Present   Morphology Review    Collection Time: 12/24/23  8:54 PM   Result Value Ref Range    Smear Review Comments See Comment (A) Undefined    Polychromasia Moderate (A) Not Present    Basophilic Stippling Present (A) Not Present   ECG 12 Lead    Collection Time: 12/24/23 10:37 PM   Result Value Ref Range    EKG Systolic BP  mmHg    EKG Diastolic BP  mmHg    EKG Ventricular Rate 74 BPM    EKG Atrial Rate 74 BPM    EKG P-R Interval 134 ms    EKG QRS Duration 132 ms    EKG Q-T Interval 438 ms    EKG QTC Calculation 486 ms    EKG Calculated P Axis  63 degrees    EKG Calculated R Axis 45 degrees    EKG Calculated T Axis 57 degrees    QTC Fredericia 470 ms   hsTroponin I - 2 Hour    Collection Time: 12/24/23 10:45 PM   Result Value Ref Range    hsTroponin I 3 <=53 ng/L    delta hsTroponin I 0 <=7 ng/L   Blood Gas, Venous    Collection Time: 12/24/23 11:24 PM   Result Value Ref Range    Specimen Source Venous     FIO2 Venous Not Specified     pH, Venous 7.34 7.32 - 7.43    pCO2, Ven 67 (HH) 40 - 60 mm Hg    pO2, Ven 48 (H) 35 - 40 mm Hg    HCO3, Ven 33 (H) 22 - 27 mmol/L    Base Excess, Ven 11.0 (H) -2.0 - 2.0    O2 Saturation, Venous 77.6 40.0 - 85.0 %   Rapid Influenza / RSV / COVID PCR    Collection Time: 12/25/23 12:00 AM    Specimen: Nasopharyngeal Swab   Result Value Ref Range    SARS-CoV-2 PCR Negative Negative    Influenza A Negative Negative    Influenza B Negative Negative    RSV Negative Negative   Respiratory Pathogen Panel    Collection Time: 12/25/23 12:01 AM   Result Value Ref Range    Adenovirus Not Detected Not Detected    Coronavirus HKU1 Not Detected Not Detected    Coronavirus NL63 Not Detected Not Detected    Coronavirus 229E Not Detected Not Detected    Coronavirus OC43 PCR Not Detected Not Detected    Metapneumovirus Not Detected Not Detected    Rhinovirus/Enterovirus Not Detected Not Detected    Influenza A Not Detected Not Detected    Influenza B Not Detected Not Detected    Parainfluenza 1 Not Detected Not Detected    Parainfluenza 2 Not Detected Not Detected    Parainfluenza 3 Not Detected Not Detected    Parainfluenza 4 Not Detected Not Detected    RSV Not Detected Not Detected    Bordetella pertussis Not Detected Not Detected    Bordetella parapertussis Not Detected Not Detected    Chlamydophila (Chlamydia) pneumoniae Not Detected Not Detected    Mycoplasma pneumoniae Not Detected Not Detected    SARS-CoV-2 PCR Not Detected Not Detected   Lower Respiratory Culture    Collection Time: 12/25/23  7:53 AM    Specimen: Sputum Induced   Result Value Ref Range    Lower Respiratory Culture Specimen Not Processed     Gram Stain 10-25 PMNS/LPF     Gram Stain >25 Epithelial cells per low power field     Gram Stain       Unacceptable for Culture, Smear Results Suggest Poor Specimen Quality     Imaging: Radiology studies were personally reviewed    Current Hospital Medications:   Scheduled Meds:   albuterol  2.5 mg Nebulization Q4H (RT)    budesonide  0.5 mg Nebulization BID (RT)    dabigatran etexilate  150 mg Oral BID fluticasone furoate-vilanterol  1 puff Inhalation Daily (RT)    ipratropium-albuterol  3 mL Nebulization Q6H (RT)    levoFLOXacin  750 mg Oral daily    metoPROLOL succinate  50 mg Oral Daily    nicotine  1 patch Transdermal Nightly    predniSONE  40 mg Oral daily    sodium chloride  4 mL Nebulization BID (  RT)    tobramycin (PF)  300 mg Nebulization BID (RT)    umeclidinium  1 puff Inhalation Daily (RT)     Continuous Infusions:  PRN Meds:.nicotine polacrilex      Counseling/Coordination of Care:   I personally spent greater than 50 minutes face-to-face and non-face-to-face in the care of this patient, which includes all pre, intra, and post visit time on the date of service.  All documented time was specific to the E/M visit and does not include any procedures that may have been performed.

## 2023-12-25 NOTE — Unmapped (Signed)
 [facility]  Emergency Department Provider Note    ED Clinical Impression     Final diagnoses:   Dyspnea, unspecified type (Primary)   Bronchiectasis with (acute) exacerbation (CMS-HCC)       HPI, ED Course, Assessment and Plan     Initial Clinical Impression:    December 25, 2023 6:11 AM   John Crawford is a 68 y.o. male with past medical history of COPD, bronchiectasis on inhaled tobramycin presenting with increasing shortness of breath and hypoxia.  The patient reports that over the last several months, he has had general developing in his respiratory status.  He recently completed treatment of COPD and bronchiectasis flare with steroids and Levaquin, is currently on inhaled tobramycin to try to eradicate Pseudomonas.  He notes no significant change with his treatments, his wife recently had influenza and he came down with similar symptoms and since that time has noted increasing shortness of breath and wheezing.  He is unable to obtain some of his medicines due to cost concerns.  He is using 2.5 L of oxygen at night and his recently been needing it throughout the day as well.  No persistent fevers though he did originally have fever with his acute respiratory illness.  He feels very out of breath even on minimal exertion.  Denies any acute change in cough and sputum has been white and not copious.  No chest pain.  He currently still smokes but is cut down his usage.    BP 127/70  - Pulse 83  - Temp 36.2 ??C (97.1 ??F) (Oral)  - Resp 18  - Ht 157.5 cm (5' 2)  - Wt 77.1 kg (170 lb)  - SpO2 97%  - BMI 31.09 kg/m??   Initial vital signs are notable for afebrile, nontachycardic, normotensive, saturating well on 2 L via nasal cannula    Pertinent physical exam: On my initial evaluation, the patient is nontoxic appearing, in no acute distress.  There are no murmurs rubs or gallops, the patient has poor air movement throughout and diffuse expiratory wheezing throughout, work of breathing is appropriate but he is speaking in short sentences, no lower extremity edema, abdominal exam is benign.    Medical Decision Making  This is a 68 y.o. male with history as above presenting with shortness of breath in the setting of bronchiectasis and COPD.  I do have concern for acute exacerbation given viral symptoms in the last 2 weeks, however these constellation of symptoms do seem to have a somewhat insidious onset over the last several months and could represent worsening of underlying disease. Low suspicion for alternate etiologies such as pneumothorax, acute PE, lobar pneumonia. Presentation not consistent with other acute cardiopulmonary causes including ACS, CHF exacerbation.  Will plan for aggressive pulmonary toilet, DuoNebs given component of potential COPD exacerbation as well, and plan for admission given increased CO2 retention and significant dyspnea on minimal exertion, decreased functional status in the setting of bronchiectasis and COPD exacerbation.  He received Solu-Medrol and albuterol neb in the office today, had pulse oximetry of 76% on room air in the office.  He reconfirms DNR/DNI status, but would desire noninvasive respiratory support if needed.        Further ED updates and updates to plan as per ED Course below:    ED Course:  ED Course as of 12/25/23 0644   Thu Dec 25, 2023   0111 CBC without leukocytosis, with stable anemia, without thrombocytopenia.  CMP with no AKI, no  actionable electrolyte abnormalities though potassium was mildly elevated, VBG demonstrating retention mildly increased from priors in system.  Troponin is negative.  EKG with sinus rhythm, frequent PACs, right bundle branch block pattern, without ST-T changes concerning for acute ischemia or abnormal T wave inversions.  Chest x-ray without acute findings       MDM Elements  I have reviewed recent and relevant previous record, including: Internal medicine visit today, pulmonology telemedicine visit 11/26/2023, PFTs 11/24/2023, cardiology visit 11/24/2023  Independent Interpretation of Studies: EKG(s) - see ED course and X-ray(s) - see ED course  Prescription drug(s) considered but not prescribed: Antivirals - no viral illness amenable to antivirals detected  Diagnostic test(s) considered but not performed: Considered PE workup however in setting of more likely clinical cause, lack of tachycardia, lower extremity edema, or fever defer    Discussion of Management with other Physicians, QHP or Appropriate Source: Admitting team - family medicine  Escalation of Care including OBS/Admission/Transfer was considered: Patient is to be admitted for further cares  ____________________________________________    The case was discussed with the attending physician who is in agreement with the above assessment and plan.     Past History     PAST MEDICAL HISTORY/PAST SURGICAL HISTORY:   Past Medical History:   Diagnosis Date    Atrial flutter (CMS-HCC)     Bronchiectasis without complication (CMS-HCC) 08/17/2020    COPD (chronic obstructive pulmonary disease) (CMS-HCC)     Tobacco abuse        Past Surgical History:   Procedure Laterality Date    PR COMPRE EP EVAL ABLTJ 3D MAPG TX SVT N/A 12/15/2019    Procedure: A-Flutter Ablation;  Surgeon: Webb Silversmith, MD;  Location: [facility] EP;  Service: Cardiology       MEDICATIONS:     Current Facility-Administered Medications:     albuterol 2.5 mg /3 mL (0.083 %) nebulizer solution 2.5 mg, 2.5 mg, Nebulization, Q4H (RT), Ripley Fraise, MD, 2.5 mg at 12/25/23 0429    budesonide (PULMICORT) nebulizer solution 0.5 mg, 0.5 mg, Nebulization, BID (RT), Ripley Fraise, MD    dabigatran etexilate (PRADAXA) capsule 150 mg, 150 mg, Oral, BID, Ripley Fraise, MD, 150 mg at 12/25/23 0329    fluticasone furoate-vilanterol (BREO ELLIPTA) 200-25 mcg/dose inhaler 1 puff, 1 puff, Inhalation, Daily (RT), Ripley Fraise, MD    ipratropium-albuterol (DUO-NEB) 0.5-2.5 mg/3 mL nebulizer solution 3 mL, 3 mL, Nebulization, Q6H (RT), Ripley Fraise, MD    levoFLOXacin [facility]) tablet 750 mg, 750 mg, Oral, daily, Ripley Fraise, MD, 750 mg at 12/25/23 1610    metoPROLOL succinate (Toprol-XL) 24 hr tablet 50 mg, 50 mg, Oral, Daily, Ripley Fraise, MD    nicotine (NICODERM CQ) 21 mg/24 hr patch 1 patch, 1 patch, Transdermal, Nightly, Ripley Fraise, MD, 1 patch at 12/25/23 9604    nicotine polacrilex (NICORETTE) lozenge 4 mg, 4 mg, Buccal, Q1H PRN, Ripley Fraise, MD    predniSONE (DELTASONE) tablet 40 mg, 40 mg, Oral, daily, Ripley Fraise, MD, 40 mg at 12/25/23 0549    sodium chloride 3 % NEBULIZER solution 4 mL, 4 mL, Nebulization, BID (RT), Ripley Fraise, MD    tobramycin (PF) (TOBI) 300 mg/5 mL nebulizer solution 300 mg, 300 mg, Nebulization, BID (RT), Ripley Fraise, MD    umeclidinium (INCRUSE ELLIPTA) 62.5 mcg/actuation inhaler 1 puff, 1 puff, Inhalation, Daily (RT), Ripley Fraise, MD    Current Outpatient Medications:  albuterol 2.5 mg /3 mL (0.083 %) nebulizer solution, Inhale 3 mL (2.5 mg total) by nebulization Every four (4) hours., Disp: 540 mL, Rfl: 11    albuterol HFA 90 mcg/actuation inhaler, Inhale 2 puffs every four (4) hours as needed for wheezing., Disp: 18 g, Rfl: 11    azithromycin (ZITHROMAX) 250 MG tablet, Take 1 tablet (250 mg total) by mouth daily., Disp: 90 tablet, Rfl: 3    budesonide (PULMICORT) 0.5 mg/2 mL nebulizer solution, Inhale 2 mL (0.5 mg total) by nebulization two (2) times a day. Dx code J44.1, Disp: 120 mL, Rfl: 11    budesonide-glycopyr-formoterol (BREZTRI) 160-9-4.8 mcg/actuation HFAA, Inhale 2 Inhalations two (2) times a day., Disp: 10.7 g, Rfl: 3    citalopram (CELEXA) 20 MG tablet, Take 1 tablet by mouth once daily, Disp: 90 tablet, Rfl: 0    dabigatran etexilate (PRADAXA) 150 mg capsule, Take 1 capsule (150 mg total) by mouth two (2) times a day., Disp: 60 capsule, Rfl: 11    EPINEPHrine (EPIPEN) 0.3 mg/0.3 mL injection, Inject 0.3 mL (0.3 mg total) into the muscle as needed for anaphylaxis. For emergency use with [name], Disp: 2 each, Rfl: 1    ipratropium-albuterol (DUO-NEB) 0.5-2.5 mg/3 mL nebulizer, Inhale 3 mL by nebulization every six (6) hours. Dx code J44.1, Disp: 360 mL, Rfl: 11    levoFLOXacin (LEVAQUIN) 750 MG tablet, Take 1 tablet (750 mg total) by mouth daily., Disp: 7 tablet, Rfl: 0    mepolizumab ([name]) 100 mg/mL [name], Inject the contents of 1 pen (100 mg) under the skin every twenty-eight (28) days., Disp: 1 mL, Rfl: 6    metoPROLOL succinate (TOPROL-XL) 50 MG 24 hr tablet, Take 1 tablet (50 mg total) by mouth daily., Disp: 90 tablet, Rfl: 0    nebulizers Misc, Use as directed with inhaled medicines, Disp: 1 each, Rfl: 11    nicotine (NICODERM CQ) 21 mg/24 hr patch, Place 1 patch on the skin daily., Disp: 28 patch, Rfl: 2    nicotine polacrilex (NICORETTE MINI) lozenge 4 MG, Apply 1 lozenge (4 mg total) to cheek every hour as needed for smoking cessation. Weeks 1 to 6: 1 lozenge every 1 to 2 hours (maximum: 5 lozenges every 6 hours; 20 lozenges/day) Weeks 7 to 9: 1 lozenge every 2 to 4 hours (maximum: 5 lozenges every 6 hours; 20 lozenges/day). Weeks 10 to 12: 1 lozenge every 4 to 8 hours (maximum: 5 lozenges every 6 hours; 20 lozenges/day), Disp: 48 lozenge, Rfl: 3    ondansetron (ZOFRAN-ODT) 4 MG disintegrating tablet, Take 1 tablet (4 mg total) by mouth every eight (8) hours as needed for nausea., Disp: 30 tablet, Rfl: 1    pantoprazole (PROTONIX) 40 MG tablet, Take 1 tablet (40 mg total) by mouth Two (2) times a day., Disp: 60 tablet, Rfl: 3    predniSONE (DELTASONE) 10 MG tablet, Take 1 tablet (10 mg total) by mouth daily., Disp: 30 tablet, Rfl: 1    predniSONE (DELTASONE) 20 MG tablet, Take 3 tablets (60 mg total) by mouth daily for 5 days, THEN 2.5 tablets (50 mg total) daily for 3 days, THEN 2 tablets (40 mg total) daily for 3 days, THEN 1.5 tablets (30 mg total) daily for 3 days, THEN 1 tablet (20 mg total) daily for 3 days., Disp: 36 tablet, Rfl: 0    sodium chloride 7% (PULMOSAL) 7 % Nebu, Inhale 4 mL by nebulization two (2) times a day., Disp: 240 mL, Rfl: 6  tiotropium bromide (SPIRIVA RESPIMAT) 2.5 mcg/actuation inhalation mist, Inhale 2 puffs by mouth daily., Disp: 4 g, Rfl: 11    tobramycin, PF, (TOBI) 300 mg/5 mL nebulizer solution, Inhale 5 mL (300 mg total) by nebulization every twelve (12) hours for 28 days., Disp: 280 mL, Rfl: 0    ALLERGIES:   Patient has no known allergies.    SOCIAL HISTORY:   Social History     Tobacco Use    Smoking status: Every Day     Current packs/day: 1.00     Average packs/day: 1 pack/day for 25.4 years (25.4 ttl pk-yrs)     Types: Cigarettes     Start date: 08/07/1998    Smokeless tobacco: Never    Tobacco comments:     started smoking at 17, 2ppd for 35 years, currently smoking 1ppdd   Substance Use Topics    Alcohol use: Yes     Comment: 1 to 2 cans of beer daily       FAMILY HISTORY:  Family History   Problem Relation Age of Onset    Colon cancer Mother     Heart attack Father     Prostate cancer Father           Review of Systems   A review of systems was performed and relevant portions were as noted above in HPI     Physical Exam     VITAL SIGNS:    BP 127/70  - Pulse 83  - Temp 36.2 ??C (97.1 ??F) (Oral)  - Resp 18  - Ht 157.5 cm (5' 2)  - Wt 77.1 kg (170 lb)  - SpO2 97%  - BMI 31.09 kg/m??     Constitutional:   Alert and oriented. In mild acute distress. Please see pertinent exam above as well.  Head:   Normocephalic and atraumatic  Eyes:   Conjunctivae are normal, EOMI, PERRL  ENT:   No notable congestion, mucous membranes moist, external ears normal, no notable stridor  Cardiovascular:   Rate as vitals above. Appears warm and well perfused  Respiratory:   Mildly increased respiratory effort. Breath sounds are decreased air movement throughout, significant expiratory wheezes throughout, speaking in short sentences with dyspnea on minimal exertion.  Gastrointestinal:   Soft, non-distended, and nontender without rebound or guarding.   Genitourinary:   Deferred  Musculoskeletal:    Normal range of motion in all extremities. No tenderness or edema noted in B/L lower extremities  Neurologic:   No gross focal neurologic deficits beyond baseline are appreciated  Skin:   Skin is warm, dry and intact    Radiology     XR Chest 2 views   Final Result      No acute cardiopulmonary abnormalities.          Labs     Labs Reviewed   COMPREHENSIVE METABOLIC PANEL - Abnormal; Notable for the following components:       Result Value    Potassium 5.0 (*)     CO2 34.7 (*)     Creatinine 0.70 (*)     All other components within normal limits   BLOOD GAS, VENOUS - Abnormal; Notable for the following components:    pCO2, Ven 67 (*)     pO2, Ven 48 (*)     HCO3, Ven 33 (*)     Base Excess, Ven 11.0 (*)     All other components within normal limits   CBC W/ AUTO DIFF - Abnormal; Notable  for the following components:    HGB 10.0 (*)     HCT 32.5 (*)     MCV 75.9 (*)     MCH 23.2 (*)     MCHC 30.6 (*)     RDW 21.3 (*)     Absolute Lymphocytes 0.7 (*)     Absolute Monocytes 0.2 (*)     Microcytosis Slight (*)     Anisocytosis Marked (*)     All other components within normal limits   SLIDE REVIEW - Abnormal; Notable for the following components:    Smear Review Comments See Comment (*)     Polychromasia Moderate (*)     Basophilic Stippling Present (*)     All other components within normal limits   INFLUENZA/RSV/COVID PCR - Normal    Narrative:     This test was performed using the Cepheid Xpert Xpress SARS-CoV-2/Flu/RSV plus assay, which has been validated by the CLIA-certified, CAP-inspected [facility]. FDA has granted Emergency Use Authorization for this test. Negative results do not preclude infection and should be interpreted along with clinical observations, patient history, and epidemiological information. Information for providers and patients can be found here: [email or web address]   HIGH SENSITIVITY TROPONIN I - SERIAL - Normal   HIGH SENSITIVITY TROPONIN I - 2 HOUR SERIAL - Normal   LOWER RESPIRATORY CULTURE   RESPIRATORY PATHOGEN PANEL   CBC W/ DIFFERENTIAL    Narrative:     The following orders were created for panel order CBC w/ Differential.                  Procedure                               Abnormality         Status                                     ---------                               -----------         ------                                     CBC w/ Differential[[id number]]         Abnormal            Final result                               Morphology [id number]           Abnormal            Final result                                                 Please view results for these tests on the individual orders.   PROTIME-INR       Pertinent labs & imaging results that were available during my care of the patient were reviewed by me and  considered in my medical decision making. Labs and radiology studies included in my note may not constitute all ordered/reviewied labs during this encounter (see chart for details).      Please note- This chart has been created using [employer]. Chart creation errors have been sought, but may not always be located and such creation errors, especially pronoun confusion, do NOT reflect on the standard of medical care.       Ripley Fraise, MD  Resident  12/25/23 [phone]

## 2023-12-25 NOTE — Unmapped (Signed)
 [facility]  Emergency Department Progress Note    December 25, 2023 7:13 AM    ED care from Dr. Marlyn Corporal at 7:00 AM. John Crawford is a 68 y.o. male with past medical history of COPD, bronchiectasis on inhaled tobramycin presenting with increasing shortness of breath and hypoxia. At time of sign-out, patient is paged out for admission pending team placement at this time.       Medical Decision Making and Progress Notes          Independent Interpretation of Studies: None  External Records Reviewed: Patient's most recent outpatient clinic note  Escalation of Care, Consideration of Admission/Observation/Transfer: Patient admitted for COPD and bronchiectasis exacerbation.   Social determinants that significantly affected care: None applicable  Prescription drug(s) considered but not prescribed: None  Diagnostic tests considered but not performed: None  History obtained from other sources: None    Additional Progress Notes and Critical Care    8:01 AM patient overall appears generally nontoxic.  Will page Pulmonology for bronchiectasis exacerbation.  Home meds ordered.  Will plan for admission once bed available.    Portions of this record have been created using [employer], clinical (histocompatibility and immunogenetics). Dictation errors have been sought, but may not have been identified and corrected.      ED Clinical Impression       Final diagnoses:   Dyspnea, unspecified type (Primary)   Bronchiectasis with (acute) exacerbation (CMS-HCC)     Documentation assistance was provided by Enzo Bi, Scribe on December 25, 2023 at 7:15 AM for Shaune Leeks, MD.    December 27, 2023 12:28 AM. Documentation assistance provided by the scribe. I was present during the time the encounter was recorded. The information recorded by the scribe was done at my direction and has been reviewed and validated by me.       Note has been documented by Enzo Bi on 12/25/2023

## 2023-12-25 NOTE — Unmapped (Signed)
 PULMONARY CONSULT  NOTE      Patient: John Crawford([id number])  Reason for consultation: John Crawford is a 68 y.o. male who is seen in consultation at the request of Sherryl Barters, MD for comprehensive evaluation of COPD/Bronchiectasis, SOB, and Hypoxia.    Assessment and Recommendations:      Active Problems:    * No active hospital problems. *    COPD Exacerbation - Bronchiectasis Exacerbation  68y/o male w/ severe COPD/Bronchiectasis on 2 liters O2 at night and active smoker with at least a 50 pack year smoking history who presents to the ED with worsening SOB especially on exertion most likely presenting with COPD and Bronchiectasis exacerbation. Pt continues to smoke at least a pack a day of cigarettes at home and is not interested in quiting. Some of his symptoms likely also worsened with stopping his nucala due to financial problems. His cough has improved some but he is still complaining of dyspnea with getting dressed and chest congestion. Cough has changed from yellow sputum production to more clear. He was seen yesterday in clinic and was advised to go to the ED but had to go home to take care of personal business before presenting today. Yesterday he received a duoneb treatment, IV solumedrol 125mg  and also now getting inhaled tobramycin. No leukocytosis and CXR showed no lobar consolidation. RPP negative thus far and lower respiratory culture pending. Recs are as followed below.       Recommendations:  - Prednisone taper: 40mg  for 3 days, 30mg  for 3 days, 20mg  for 3 days, 10mg  for 3 days  - Recommend continuing home inhalers  - Scheduled Duonebs Q4-6H  - Recommend 14 day course of ciprofloxacin to help with pseudomonal clearance.   - Recommend good pulmonary clearance while in hospital  - Would add hypertonic saline to help with airway clearance.   - Would continue inhaled tobramycin BID for the scheduled 28 days   - Recommend referral to pulmonary rehab as an outpatient.     We appreciate the opportunity to assist in the care of this patient.  Please page [phone] with any questions.    Adalberto Ill, MD    Plan discussed with Dr. Eliberto Ivory.     Subjective:      History of Present Illness:  John Crawford is a 68 y.o. male w/ hx of severe COPD/Bronchiectasis, 50 pack year smoking hx, active smoker, and previously on [name] who presents with worsening shortness of breath. Pt reports about a month ago he was hospitalized for similar symptoms and then 2 weeks later caught the flu and was hospitalized again. He was seen in clinic yesterday in which he had SOB and dyspnea with getting dressed. He was also found to be hypoxic with saturations in the 70s. He was advised to go to the ED but declined and went home first. He now presents today still with shortness of breath with getting dressed. He uses 2 liters of oxygen at night and tries not to use any during the day. He still smokes about 1 pack of cigarettes a day and is not interested in quitting. He was a prior tobacco farmer. He reports he does have a cough that is manageable and sputum changed from yellow to clear. Still expresses having wheezing and chest congestion. He mentions it feels like he can't cough out what is in his chest. Mentions he also thinks some of his issues could be with his mouth. He would like some teeth pulled and  reports he has not seen a dentist in many years due to cost. He mentions brushing his teeth when he remembers to or thinks he needs it.     Review of Systems: A comprehensive review of systems was performed and was negative except as above in HPI  Past Medical History:   Diagnosis Date    Atrial flutter (CMS-HCC)     Bronchiectasis without complication (CMS-HCC) 08/17/2020    COPD (chronic obstructive pulmonary disease) (CMS-HCC)     Tobacco abuse      Past Surgical History:   Procedure Laterality Date    PR COMPRE EP EVAL ABLTJ 3D MAPG TX SVT N/A 12/15/2019    Procedure: A-Flutter Ablation;  Surgeon: Webb Silversmith, MD; Location: [facility] EP;  Service: Cardiology     Medications reviewed in Epic  Allergies as of 12/24/2023    (No Known Allergies)     Family History   Problem Relation Age of Onset    Colon cancer Mother     Heart attack Father     Prostate cancer Father      Social History     Tobacco Use    Smoking status: Every Day     Current packs/day: 1.00     Average packs/day: 1 pack/day for 25.4 years (25.4 ttl pk-yrs)     Types: Cigarettes     Start date: 08/07/1998    Smokeless tobacco: Never    Tobacco comments:     started smoking at 17, 2ppd for 35 years, currently smoking 1ppdd   Substance Use Topics    Alcohol use: Yes     Comment: 1 to 2 cans of beer daily        Objective:      Physical Exam:  Vitals:    12/25/23 0500 12/25/23 0600 12/25/23 0700 12/25/23 0724   BP: 113/60 127/70 123/76    Pulse:       Resp: 16 18  20    Temp:    36.6 ??C (97.8 ??F)   TempSrc:    Oral   SpO2: 96% 97%     Weight:       Height:         General: Alert, well-appearing, and in no distress.  Eyes: Anicteric sclera, conjunctiva clear.  ENT:  Some erythema and cobblestoning in the back of the throat. Poor dental hygiene   Lungs: Diffuse expiratory wheeze on exam.   Cardiovascular: Regular rate and rhythm, S1, S2 normal, no murmur, click, rub or gallop appreciated.  Abdomen: Soft, non-tender, not distended, bowel sounds are normal, liver is not enlarged, spleen is not enlarged  Musculoskeletal: No clubbing and no synovitis.  Skin: No rashes or lesions.  Neuro: No focal neurological deficits.    Malnutrition Assessment by RD:          Diagnostic Review:   All labs and images were personally reviewed.

## 2024-01-06 NOTE — Unmapped (Signed)
 [facility] Clinical Assessment & Refill Coordination Note    John Crawford, [place]: 1955/12/13  Phone: There are no phone numbers on file.    All above HIPAA information was verified with patient's family member, wife.     Was a Nurse, learning disability used for this call? No    Specialty Medication(s):   CF/Pulmonary/Asthma: [name]  Tobramycin 300mg /58mL, Pari nebulizer cup     Current Outpatient Medications   Medication Sig Dispense Refill    albuterol 2.5 mg /3 mL (0.083 %) nebulizer solution Inhale 3 mL (2.5 mg total) by nebulization Every four (4) hours. 540 mL 11    albuterol 5 mg/mL nebulizer solution Inhale 0.5 mL (2.5 mg total) by nebulization every six (6) hours as needed for wheezing. 20 mL 12    albuterol HFA 90 mcg/actuation inhaler Inhale 2 puffs every four (4) hours as needed for wheezing. 18 g 11    azithromycin (ZITHROMAX) 250 MG tablet Take 1 tablet (250 mg total) by mouth daily. 90 tablet 3    budesonide (PULMICORT) 0.5 mg/2 mL nebulizer solution Inhale 2 mL (0.5 mg total) by nebulization two (2) times a day. Dx code J44.1 120 mL 11    budesonide-glycopyr-formoterol (BREZTRI) 160-9-4.8 mcg/actuation HFAA Inhale 2 Inhalations two (2) times a day. 10.7 g 3    ciprofloxacin HCl (CIPRO) 500 MG tablet Take 1 tablet (500 mg total) by mouth two (2) times a day for 14 days. 28 tablet 0    citalopram (CELEXA) 20 MG tablet Take 1 tablet by mouth once daily 90 tablet 0    dabigatran etexilate (PRADAXA) 150 mg capsule Take 1 capsule (150 mg total) by mouth two (2) times a day. 60 capsule 11    EPINEPHrine (EPIPEN) 0.3 mg/0.3 mL injection Inject 0.3 mL (0.3 mg total) into the muscle as needed for anaphylaxis. For emergency use with [name] 2 each 1    ipratropium-albuterol (DUO-NEB) 0.5-2.5 mg/3 mL nebulizer Inhale 3 mL by nebulization every six (6) hours. Dx code J44.1 360 mL 11    levoFLOXacin (LEVAQUIN) 750 MG tablet Take 1 tablet (750 mg total) by mouth daily. 7 tablet 0    mepolizumab ([name]) 100 mg/mL [name] Inject the contents of 1 pen (100 mg) under the skin every twenty-eight (28) days. 1 mL 6    metoPROLOL succinate (TOPROL-XL) 50 MG 24 hr tablet Take 1 tablet (50 mg total) by mouth daily. 90 tablet 0    nebulizers Misc Use as directed with inhaled medicines 1 each 11    nicotine (NICODERM CQ) 21 mg/24 hr patch Place 1 patch on the skin daily. 28 patch 2    nicotine polacrilex (NICORETTE MINI) lozenge 4 MG Apply 1 lozenge (4 mg total) to cheek every hour as needed for smoking cessation. Weeks 1 to 6: 1 lozenge every 1 to 2 hours (maximum: 5 lozenges every 6 hours; 20 lozenges/day)  Weeks 7 to 9: 1 lozenge every 2 to 4 hours (maximum: 5 lozenges every 6 hours; 20 lozenges/day).  Weeks 10 to 12: 1 lozenge every 4 to 8 hours (maximum: 5 lozenges every 6 hours; 20 lozenges/day) 48 lozenge 3    ondansetron (ZOFRAN-ODT) 4 MG disintegrating tablet Take 1 tablet (4 mg total) by mouth every eight (8) hours as needed for nausea. 30 tablet 1    pantoprazole (PROTONIX) 40 MG tablet Take 1 tablet (40 mg total) by mouth Two (2) times a day. 60 tablet 3    predniSONE (DELTASONE) 10  MG tablet Take 1 tablet (10 mg total) by mouth daily. 30 tablet 1    predniSONE (DELTASONE) 20 MG tablet Take 3 tablets (60 mg total) by mouth daily for 5 days, THEN 2.5 tablets (50 mg total) daily for 3 days, THEN 2 tablets (40 mg total) daily for 3 days, THEN 1.5 tablets (30 mg total) daily for 3 days, THEN 1 tablet (20 mg total) daily for 3 days. 36 tablet 0    predniSONE (DELTASONE) 20 MG tablet Take 2 tablets (40 mg total) by mouth daily for 3 days, THEN 1.5 tablets (30 mg total) daily for 3 days, THEN 1 tablet (20 mg total) daily for 3 days, THEN 0.5 tablets (10 mg total) daily for 3 days. 15 tablet 0    sodium chloride 7% (PULMOSAL) 7 % Nebu Inhale 4 mL by nebulization two (2) times a day. 240 mL 6    tiotropium bromide (SPIRIVA RESPIMAT) 2.5 mcg/actuation inhalation mist Inhale 2 puffs by mouth daily. 4 g 11 tobramycin, PF, (TOBI) 300 mg/5 mL nebulizer solution Inhale 5 mL (300 mg total) by nebulization every twelve (12) hours for 28 days. 280 mL 0     No current facility-administered medications for this visit.        Changes to medications: Taryn reports no changes at this time.    Medication list has been reviewed and updated in Epic: Yes    No Known Allergies    Changes to allergies: No    Allergies have been reviewed and updated in Epic: Yes    SPECIALTY MEDICATION ADHERENCE     Tobramycin 300  mg/4mL : ~7 days of medicine on hand   [name] 100 mg/ml: 0 doses of medicine on hand       Medication Adherence    Specialty Medication: Tobramycin 300 mg/59mL BID          Specialty medication(s) dose(s) confirmed: Regimen is correct and unchanged.     Are there any concerns with adherence?  No - Wife states he was initially taking tobramycin three times weekly but she discussed with him it should be given twice daily instead. They are now taking it as prescribed. Also has missed multiple doses of [name] as waiting for mfg assistance d/t high cost.  She received a call yesterday that application is now in process.    Adherence counseling provided? Not needed    CLINICAL MANAGEMENT AND INTERVENTION      Clinical Benefit Assessment:    Do you feel the medicine is effective or helping your condition? No     Clinical Benefit counseling provided? Reasonable expectations discussed: We discussed to follow-up with pulmonologist once he completes 4 weeks of tobramycin.  He will likely need to complete a sputum culture to determine medication effectiveness. Sputum culture collected at ED visit was not processed.     Adverse Effects Assessment:    Are you experiencing any side effects? Yes, patient reports experiencing leg swelling. See clinical intervention documentation: We discussed edema is not a reported side effect of tobramycin. I encouraged wife to follow-up with cardiologist for further evaluation. We discussed using compression socks and keeping leg elevated when possible. She confirmed he is not taking a diuretic.     Are you experiencing difficulty administering your medicine? No    Quality of Life Assessment:    Quality of Life    Rheumatology  Oncology  Dermatology  Cystic Fibrosis          How many days  over the past month did your bronchiectasis  keep you from your normal activities? For example, brushing your teeth or getting up in the morning. Wife states he sometimes requires increased oxygen requirements which limits ability to complete ADLs.     Have you discussed this with your provider? Yes    Acute Infection Status:    Acute infections noted within Epic:  No active infections    Patient reported infection: None    Therapy Appropriateness:    Is therapy appropriate based on current medication list, adverse reactions, adherence, clinical benefit and progress toward achieving therapeutic goals? Yes, therapy is appropriate and should be continued     Clinical Intervention:    Was an intervention completed as part of this clinical assessment? No    DISEASE/MEDICATION-SPECIFIC INFORMATION      For patients on injectable medications: Patient currently has 0 doses left.  Next injection is scheduled for ASAP - awaiting mfg assistance approval.    Other Pulmonary Conditions: Not Applicable    PATIENT SPECIFIC NEEDS     Does the patient have any physical, cognitive, or cultural barriers? No    Is the patient high risk? No    Does the patient require physician intervention or other additional services (i.e., nutrition, smoking cessation, social work)? No    Does the patient have an additional or emergency contact listed in their chart? Yes    SOCIAL DETERMINANTS OF HEALTH     At the [facility], we have learned that life circumstances - like trouble affording food, housing, utilities, or transportation can affect the health of many of our patients.   That is why we wanted to ask: are you currently experiencing any life circumstances that are negatively impacting your health and/or quality of life? Patient declined to answer    Social Drivers of Health     Food Insecurity: No Food Insecurity (12/15/2019)    Hunger Vital Sign     Worried About Running Out of Food in the Last Year: Never true     Ran Out of Food in the Last Year: Never true   Tobacco Use: High Risk (12/24/2023)    Patient History     Smoking Tobacco Use: Every Day     Smokeless Tobacco Use: Never     Passive Exposure: Not on file   Transportation Needs: No Transportation Needs (03/21/2020)    PRAPARE - Transportation     Lack of Transportation (Medical): No     Lack of Transportation (Non-Medical): No   Alcohol Use: Not At Risk (11/16/2020)    Alcohol Use     How often do you have a drink containing alcohol?: Never     How many drinks containing alcohol do you have on a typical day when you are drinking?: 1 - 2     How often do you have 5 or more drinks on one occasion?: Never   Housing: Not on file   Physical Activity: Not on file   Utilities: Not on file   Stress: Not on file   Interpersonal Safety: Not on file   Substance Use: Not on file (08/11/2023)   Intimate Partner Violence: Not At Risk (11/06/2020)    Humiliation, Afraid, Rape, and Kick questionnaire     Fear of Current or Ex-Partner: No     Emotionally Abused: No     Physically Abused: No     Sexually Abused: No   Social Connections: Not on file   Financial Resource Strain:  Low Risk  (10/26/2020)    Overall Financial Resource Strain (CARDIA)     Difficulty of Paying Living Expenses: Not hard at all   Depression: Not at risk (11/08/2022)    PHQ-2     PHQ-2 Score: 0   Internet Connectivity: Not on file   Health Literacy: Not on file       Would you be willing to receive help with any of the needs that you have identified today? Not applicable       SHIPPING     Specialty Medication(s) to be Shipped:   None    Other medication(s) to be shipped: No additional medications requested for fill at this time Wife will call back tomorrow to request refills for non-spec meds.     Changes to insurance: No    Cost and Payment:  N/A    Delivery Scheduled: Patient declined refill at this time due to completing treatment of tobramycin and awaiting mfg assistance approval for [name].     The patient will receive a drug information handout for each medication shipped and additional FDA Medication Guides as required.  Verified that patient has previously received a Conservation officer, historic buildings and a Surveyor, mining.    The patient or caregiver noted above participated in the development of this care plan and knows that they can request review of or adjustments to the care plan at any time.      All of the patient's questions and concerns have been addressed.    [name], PharmD   [facility] Specialty Pharmacist

## 2024-01-20 MED FILL — PANTOPRAZOLE 40 MG TABLET,DELAYED RELEASE: ORAL | 30 days supply | Qty: 60 | Fill #1

## 2024-01-20 MED FILL — ONDANSETRON 4 MG DISINTEGRATING TABLET: ORAL | 10 days supply | Qty: 30 | Fill #1

## 2024-01-29 NOTE — Unmapped (Signed)
 Specialty Medication(s): Tobramycin     Mr.Rando has been dis-enrolled from the [facility] specialty pharmacy services as a result of therapy completion - expected therapy completion date: ~01/31/24 .    Additional information provided to the patient: Gave wife phone numbers to receive Breztri and Sprivia from mfg now that he is approved for mfg assistance. She is already in contact with mfg to receive [name] .     Joseph Nickel, PharmD  [facility] Specialty Pharmacist

## 2024-01-30 DIAGNOSIS — F419 Anxiety disorder, unspecified: Principal | ICD-10-CM

## 2024-01-30 MED ORDER — CITALOPRAM 20 MG TABLET
ORAL_TABLET | Freq: Every day | ORAL | 0 refills | 90.00000 days | Status: CP
Start: 2024-01-30 — End: ?

## 2024-01-31 MED ORDER — METOPROLOL SUCCINATE ER 50 MG TABLET,EXTENDED RELEASE 24 HR
ORAL_TABLET | Freq: Every day | ORAL | 0 refills | 0.00000 days
Start: 2024-01-31 — End: ?

## 2024-02-04 MED ORDER — METOPROLOL SUCCINATE ER 50 MG TABLET,EXTENDED RELEASE 24 HR
ORAL_TABLET | Freq: Every day | ORAL | 0 refills | 90.00000 days | Status: CP
Start: 2024-02-04 — End: ?

## 2024-02-21 DIAGNOSIS — R110 Nausea: Principal | ICD-10-CM

## 2024-02-21 DIAGNOSIS — J441 Chronic obstructive pulmonary disease with (acute) exacerbation: Principal | ICD-10-CM

## 2024-02-21 DIAGNOSIS — J449 Chronic obstructive pulmonary disease, unspecified: Principal | ICD-10-CM

## 2024-02-21 MED ORDER — PREDNISONE 10 MG TABLET
ORAL_TABLET | Freq: Every day | ORAL | 1 refills | 30.00000 days
Start: 2024-02-21 — End: ?

## 2024-02-21 MED ORDER — ONDANSETRON 4 MG DISINTEGRATING TABLET
ORAL_TABLET | Freq: Three times a day (TID) | ORAL | 1 refills | 10.00000 days | PRN
Start: 2024-02-21 — End: 2024-11-06

## 2024-02-23 MED ORDER — PREDNISONE 10 MG TABLET
ORAL_TABLET | Freq: Every day | ORAL | 2 refills | 30.00000 days | Status: CP
Start: 2024-02-23 — End: ?
  Filled 2024-02-26: qty 30, 30d supply, fill #0

## 2024-02-23 MED ORDER — ONDANSETRON 4 MG DISINTEGRATING TABLET
ORAL_TABLET | Freq: Three times a day (TID) | ORAL | 1 refills | 10.00000 days | Status: CP | PRN
Start: 2024-02-23 — End: 2024-11-08
  Filled 2024-02-26: qty 30, 10d supply, fill #0

## 2024-02-26 MED FILL — PANTOPRAZOLE 40 MG TABLET,DELAYED RELEASE: ORAL | 30 days supply | Qty: 60 | Fill #2

## 2024-02-26 MED FILL — DABIGATRAN ETEXILATE 150 MG CAPSULE: ORAL | 30 days supply | Qty: 60 | Fill #1

## 2024-03-17 ENCOUNTER — Ambulatory Visit: Admit: 2024-03-17 | Discharge: 2024-03-18 | Payer: MEDICARE

## 2024-03-17 DIAGNOSIS — J449 Chronic obstructive pulmonary disease, unspecified: Principal | ICD-10-CM

## 2024-03-17 MED ORDER — AZITHROMYCIN 250 MG TABLET
ORAL_TABLET | Freq: Every day | ORAL | 5 refills | 90.00000 days
Start: 2024-03-17 — End: ?

## 2024-03-17 MED ORDER — ENSIFENTRINE 3 MG/2.5 ML SUSPENSION FOR NEBULIZATION
RESPIRATORY_TRACT | 1 refills | 0.00000 days | PRN
Start: 2024-03-17 — End: ?

## 2024-03-17 MED ORDER — ALBUTEROL SULFATE HFA 90 MCG/ACTUATION AEROSOL INHALER
RESPIRATORY_TRACT | 11 refills | 17.00000 days | PRN
Start: 2024-03-17 — End: ?

## 2024-03-17 MED ORDER — ALBUTEROL SULFATE 2.5 MG/3 ML (0.083 %) SOLUTION FOR NEBULIZATION
RESPIRATORY_TRACT | 11 refills | 30.00000 days
Start: 2024-03-17 — End: 2025-03-17

## 2024-03-19 ENCOUNTER — Encounter: Admit: 2024-03-19 | Discharge: 2024-03-19 | Payer: MEDICARE

## 2024-03-19 DIAGNOSIS — R110 Nausea: Principal | ICD-10-CM

## 2024-03-19 DIAGNOSIS — J449 Chronic obstructive pulmonary disease, unspecified: Principal | ICD-10-CM

## 2024-03-19 DIAGNOSIS — L299 Pruritus, unspecified: Principal | ICD-10-CM

## 2024-03-19 DIAGNOSIS — D649 Anemia, unspecified: Principal | ICD-10-CM

## 2024-03-19 MED ORDER — PANTOPRAZOLE 40 MG TABLET,DELAYED RELEASE
ORAL_TABLET | Freq: Two times a day (BID) | ORAL | 0 refills | 30.00000 days | Status: CP
Start: 2024-03-19 — End: ?
  Filled 2024-03-19: qty 60, 30d supply, fill #0

## 2024-03-19 MED ORDER — ALBUTEROL SULFATE HFA 90 MCG/ACTUATION AEROSOL INHALER
RESPIRATORY_TRACT | 0 refills | 0.00000 days | Status: CP | PRN
Start: 2024-03-19 — End: ?
  Filled 2024-03-19: qty 8.5, 16d supply, fill #0

## 2024-03-19 MED ORDER — ONDANSETRON 4 MG DISINTEGRATING TABLET
ORAL_TABLET | Freq: Three times a day (TID) | ORAL | 0 refills | 10.00000 days | Status: CP | PRN
Start: 2024-03-19 — End: 2024-12-03
  Filled 2024-03-19: qty 30, 10d supply, fill #0

## 2024-03-19 MED ORDER — PREDNISONE 10 MG TABLET
ORAL_TABLET | Freq: Every day | ORAL | 0 refills | 30.00000 days | Status: CP
Start: 2024-03-19 — End: ?
  Filled 2024-03-19: qty 30, 30d supply, fill #0

## 2024-03-19 MED ORDER — AZITHROMYCIN 250 MG TABLET
ORAL_TABLET | Freq: Every day | ORAL | 0 refills | 30.00000 days | Status: CP
Start: 2024-03-19 — End: ?
  Filled 2024-03-19: qty 30, 30d supply, fill #0

## 2024-03-21 MED ORDER — ALBUTEROL SULFATE HFA 90 MCG/ACTUATION AEROSOL INHALER
RESPIRATORY_TRACT | 11 refills | 34.00000 days | Status: CP | PRN
Start: 2024-03-21 — End: ?

## 2024-03-21 MED ORDER — ENSIFENTRINE 3 MG/2.5 ML SUSPENSION FOR NEBULIZATION
RESPIRATORY_TRACT | 1 refills | 0.00000 days | Status: CP | PRN
Start: 2024-03-21 — End: ?

## 2024-03-21 MED ORDER — ALBUTEROL SULFATE 2.5 MG/3 ML (0.083 %) SOLUTION FOR NEBULIZATION
RESPIRATORY_TRACT | 11 refills | 30.00000 days | Status: CP
Start: 2024-03-21 — End: 2025-03-21

## 2024-03-25 DIAGNOSIS — J449 Chronic obstructive pulmonary disease, unspecified: Principal | ICD-10-CM

## 2024-03-26 ENCOUNTER — Encounter: Admit: 2024-03-26 | Discharge: 2024-03-26 | Payer: MEDICARE

## 2024-03-26 DIAGNOSIS — D500 Iron deficiency anemia secondary to blood loss (chronic): Principal | ICD-10-CM

## 2024-03-26 DIAGNOSIS — R0602 Shortness of breath: Principal | ICD-10-CM

## 2024-03-26 DIAGNOSIS — K921 Melena: Principal | ICD-10-CM

## 2024-04-21 DIAGNOSIS — J441 Chronic obstructive pulmonary disease with (acute) exacerbation: Principal | ICD-10-CM

## 2024-04-21 DIAGNOSIS — J449 Chronic obstructive pulmonary disease, unspecified: Principal | ICD-10-CM

## 2024-04-21 DIAGNOSIS — R110 Nausea: Principal | ICD-10-CM

## 2024-04-21 MED ORDER — PREDNISONE 10 MG TABLET
ORAL_TABLET | Freq: Every day | ORAL | 0 refills | 30.00000 days
Start: 2024-04-21 — End: ?

## 2024-04-21 MED ORDER — PANTOPRAZOLE 40 MG TABLET,DELAYED RELEASE
ORAL_TABLET | Freq: Two times a day (BID) | ORAL | 0 refills | 30.00000 days
Start: 2024-04-21 — End: ?

## 2024-04-21 MED ORDER — ONDANSETRON 4 MG DISINTEGRATING TABLET
ORAL_TABLET | Freq: Three times a day (TID) | ORAL | 0 refills | 10.00000 days | PRN
Start: 2024-04-21 — End: 2025-01-05

## 2024-04-21 MED ORDER — IPRATROPIUM 0.5 MG-ALBUTEROL 3 MG (2.5 MG BASE)/3 ML NEBULIZATION SOLN
Freq: Four times a day (QID) | RESPIRATORY_TRACT | 11 refills | 30.00000 days | Status: CP
Start: 2024-04-21 — End: 2025-04-21

## 2024-04-22 DIAGNOSIS — J449 Chronic obstructive pulmonary disease, unspecified: Principal | ICD-10-CM

## 2024-04-22 DIAGNOSIS — R110 Nausea: Principal | ICD-10-CM

## 2024-04-22 MED ORDER — ONDANSETRON 4 MG DISINTEGRATING TABLET
ORAL_TABLET | Freq: Three times a day (TID) | ORAL | 0 refills | 10.00000 days | Status: CP | PRN
Start: 2024-04-22 — End: 2025-01-06
  Filled 2024-04-23: qty 30, 10d supply, fill #0

## 2024-04-22 MED ORDER — PANTOPRAZOLE 40 MG TABLET,DELAYED RELEASE
ORAL_TABLET | Freq: Two times a day (BID) | ORAL | 0 refills | 30.00000 days | Status: CP
Start: 2024-04-22 — End: ?
  Filled 2024-04-23: qty 60, 30d supply, fill #0

## 2024-04-22 MED ORDER — PREDNISONE 10 MG TABLET
ORAL_TABLET | Freq: Every day | ORAL | 0 refills | 30.00000 days | Status: CP
Start: 2024-04-22 — End: ?
  Filled 2024-04-23: qty 30, 30d supply, fill #0

## 2024-04-29 DIAGNOSIS — F419 Anxiety disorder, unspecified: Principal | ICD-10-CM

## 2024-04-29 MED ORDER — CITALOPRAM 20 MG TABLET
ORAL_TABLET | Freq: Every day | ORAL | 0 refills | 90.00000 days | Status: CP
Start: 2024-04-29 — End: ?

## 2024-04-29 MED ORDER — METOPROLOL SUCCINATE ER 50 MG TABLET,EXTENDED RELEASE 24 HR
ORAL_TABLET | Freq: Every day | ORAL | 0 refills | 90.00000 days | Status: CP
Start: 2024-04-29 — End: ?

## 2024-05-07 ENCOUNTER — Encounter: Admit: 2024-05-07 | Discharge: 2024-05-08 | Payer: MEDICARE

## 2024-05-07 DIAGNOSIS — J449 Chronic obstructive pulmonary disease, unspecified: Principal | ICD-10-CM

## 2024-05-07 DIAGNOSIS — D500 Iron deficiency anemia secondary to blood loss (chronic): Principal | ICD-10-CM

## 2024-05-07 MED ORDER — AZITHROMYCIN 250 MG TABLET
ORAL_TABLET | Freq: Every day | ORAL | 3 refills | 90.00000 days | Status: CP
Start: 2024-05-07 — End: ?
  Filled 2024-05-07: qty 50, 50d supply, fill #0

## 2024-05-14 ENCOUNTER — Encounter: Admit: 2024-05-14 | Discharge: 2024-05-15 | Payer: MEDICARE

## 2024-05-20 DIAGNOSIS — D500 Iron deficiency anemia secondary to blood loss (chronic): Principal | ICD-10-CM

## 2024-05-27 ENCOUNTER — Encounter: Admit: 2024-05-27 | Discharge: 2024-05-27 | Payer: MEDICARE

## 2024-05-27 DIAGNOSIS — R110 Nausea: Principal | ICD-10-CM

## 2024-05-27 DIAGNOSIS — J449 Chronic obstructive pulmonary disease, unspecified: Principal | ICD-10-CM

## 2024-05-27 DIAGNOSIS — D500 Iron deficiency anemia secondary to blood loss (chronic): Principal | ICD-10-CM

## 2024-05-27 MED ORDER — PANTOPRAZOLE 40 MG TABLET,DELAYED RELEASE
ORAL_TABLET | Freq: Two times a day (BID) | ORAL | 0 refills | 30.00000 days
Start: 2024-05-27 — End: ?

## 2024-05-27 MED ORDER — ONDANSETRON 4 MG DISINTEGRATING TABLET
ORAL_TABLET | Freq: Three times a day (TID) | ORAL | 0 refills | 10.00000 days | PRN
Start: 2024-05-27 — End: 2025-02-10

## 2024-05-27 MED ORDER — PREDNISONE 10 MG TABLET
ORAL_TABLET | Freq: Every day | ORAL | 0 refills | 30.00000 days
Start: 2024-05-27 — End: ?

## 2024-05-31 MED ORDER — PANTOPRAZOLE 40 MG TABLET,DELAYED RELEASE
ORAL_TABLET | Freq: Two times a day (BID) | ORAL | 0 refills | 30.00000 days | Status: CP
Start: 2024-05-31 — End: ?
  Filled 2024-06-02: qty 60, 30d supply, fill #0

## 2024-05-31 MED ORDER — PREDNISONE 10 MG TABLET
ORAL_TABLET | Freq: Every day | ORAL | 0 refills | 30.00000 days | Status: CP
Start: 2024-05-31 — End: ?
  Filled 2024-06-02: qty 30, 30d supply, fill #0

## 2024-05-31 MED ORDER — ONDANSETRON 4 MG DISINTEGRATING TABLET
ORAL_TABLET | Freq: Three times a day (TID) | ORAL | 0 refills | 10.00000 days | Status: CP | PRN
Start: 2024-05-31 — End: 2025-02-14
  Filled 2024-06-02: qty 30, 10d supply, fill #0

## 2024-06-01 MED ORDER — SODIUM CHLORIDE 7 % FOR NEBULIZATION
Freq: Two times a day (BID) | RESPIRATORY_TRACT | 6 refills | 30.00000 days | Status: CP
Start: 2024-06-01 — End: ?
  Filled 2024-06-02: qty 240, 30d supply, fill #0

## 2024-06-01 MED FILL — DABIGATRAN ETEXILATE 150 MG CAPSULE: ORAL | 30 days supply | Qty: 60 | Fill #2

## 2024-06-02 ENCOUNTER — Encounter: Admit: 2024-06-02 | Discharge: 2024-06-03 | Payer: MEDICARE

## 2024-06-27 DIAGNOSIS — J449 Chronic obstructive pulmonary disease, unspecified: Principal | ICD-10-CM

## 2024-06-27 DIAGNOSIS — R110 Nausea: Principal | ICD-10-CM

## 2024-06-27 MED ORDER — ALBUTEROL SULFATE HFA 90 MCG/ACTUATION AEROSOL INHALER
RESPIRATORY_TRACT | 11 refills | 34.00000 days | PRN
Start: 2024-06-27 — End: ?

## 2024-06-27 MED ORDER — PREDNISONE 10 MG TABLET
ORAL_TABLET | Freq: Every day | ORAL | 0 refills | 30.00000 days
Start: 2024-06-27 — End: ?

## 2024-06-27 MED ORDER — ONDANSETRON 4 MG DISINTEGRATING TABLET
ORAL_TABLET | Freq: Three times a day (TID) | ORAL | 0 refills | 10.00000 days | PRN
Start: 2024-06-27 — End: 2025-03-13

## 2024-06-27 MED ORDER — PANTOPRAZOLE 40 MG TABLET,DELAYED RELEASE
ORAL_TABLET | Freq: Two times a day (BID) | ORAL | 0 refills | 30.00000 days
Start: 2024-06-27 — End: ?

## 2024-06-28 MED ORDER — PANTOPRAZOLE 40 MG TABLET,DELAYED RELEASE
ORAL_TABLET | Freq: Two times a day (BID) | ORAL | 0 refills | 30.00000 days | Status: CP
Start: 2024-06-28 — End: ?
  Filled 2024-06-28: qty 60, 30d supply, fill #0

## 2024-06-28 MED ORDER — ALBUTEROL SULFATE HFA 90 MCG/ACTUATION AEROSOL INHALER
RESPIRATORY_TRACT | 11 refills | 34.00000 days | PRN
Start: 2024-06-28 — End: ?

## 2024-06-28 MED ORDER — ONDANSETRON 4 MG DISINTEGRATING TABLET
ORAL_TABLET | Freq: Three times a day (TID) | ORAL | 0 refills | 10.00000 days | Status: CP | PRN
Start: 2024-06-28 — End: 2025-03-14
  Filled 2024-06-28: qty 30, 10d supply, fill #0

## 2024-06-28 MED ORDER — PREDNISONE 10 MG TABLET
ORAL_TABLET | Freq: Every day | ORAL | 0 refills | 30.00000 days | Status: CP
Start: 2024-06-28 — End: ?
  Filled 2024-06-28: qty 30, 30d supply, fill #0

## 2024-06-28 MED FILL — AZITHROMYCIN 250 MG TABLET: ORAL | 50 days supply | Qty: 50 | Fill #0

## 2024-07-14 ENCOUNTER — Inpatient Hospital Stay: Admit: 2024-07-14 | Discharge: 2024-07-14 | Payer: MEDICARE

## 2024-07-14 ENCOUNTER — Ambulatory Visit: Admit: 2024-07-14 | Discharge: 2024-07-14 | Payer: MEDICARE

## 2024-07-14 DIAGNOSIS — J449 Chronic obstructive pulmonary disease, unspecified: Principal | ICD-10-CM

## 2024-07-14 DIAGNOSIS — J479 Bronchiectasis, uncomplicated: Principal | ICD-10-CM

## 2024-07-14 DIAGNOSIS — R110 Nausea: Principal | ICD-10-CM

## 2024-07-14 DIAGNOSIS — D509 Iron deficiency anemia, unspecified: Principal | ICD-10-CM

## 2024-07-14 DIAGNOSIS — Z23 Encounter for immunization: Principal | ICD-10-CM

## 2024-07-14 MED ORDER — ONDANSETRON 4 MG DISINTEGRATING TABLET
ORAL_TABLET | Freq: Three times a day (TID) | ORAL | 0 refills | 10.00000 days | PRN
Start: 2024-07-14 — End: 2025-03-30

## 2024-07-14 MED ORDER — PREDNISONE 10 MG TABLET
ORAL_TABLET | Freq: Every day | ORAL | 0 refills | 30.00000 days
Start: 2024-07-14 — End: ?

## 2024-07-14 MED ORDER — PANTOPRAZOLE 40 MG TABLET,DELAYED RELEASE
ORAL_TABLET | Freq: Two times a day (BID) | ORAL | 0 refills | 30.00000 days
Start: 2024-07-14 — End: ?

## 2024-07-14 MED ORDER — SODIUM CHLORIDE 7 % FOR NEBULIZATION
Freq: Two times a day (BID) | RESPIRATORY_TRACT | 6 refills | 30.00000 days | Status: CP
Start: 2024-07-14 — End: ?
  Filled 2024-07-16: qty 240, 30d supply, fill #0

## 2024-07-16 MED ORDER — PREDNISONE 10 MG TABLET
ORAL_TABLET | Freq: Every day | ORAL | 0 refills | 30.00000 days | Status: CP
Start: 2024-07-16 — End: ?
  Filled 2024-07-20: qty 30, 30d supply, fill #0

## 2024-07-16 MED ORDER — PANTOPRAZOLE 40 MG TABLET,DELAYED RELEASE
ORAL_TABLET | Freq: Two times a day (BID) | ORAL | 2 refills | 90.00000 days | Status: CP
Start: 2024-07-16 — End: ?
  Filled 2024-07-20: qty 60, 30d supply, fill #0

## 2024-07-16 MED ORDER — ONDANSETRON 4 MG DISINTEGRATING TABLET
ORAL_TABLET | Freq: Three times a day (TID) | ORAL | 0 refills | 10.00000 days | Status: CP | PRN
Start: 2024-07-16 — End: 2025-04-01
  Filled 2024-07-20: qty 30, 10d supply, fill #0

## 2024-08-03 DIAGNOSIS — F419 Anxiety disorder, unspecified: Principal | ICD-10-CM

## 2024-08-03 MED ORDER — METOPROLOL SUCCINATE ER 50 MG TABLET,EXTENDED RELEASE 24 HR
ORAL_TABLET | Freq: Every day | ORAL | 0 refills | 0.00000 days
Start: 2024-08-03 — End: ?

## 2024-08-03 MED ORDER — CITALOPRAM 20 MG TABLET
ORAL_TABLET | Freq: Every day | ORAL | 0 refills | 0.00000 days
Start: 2024-08-03 — End: ?

## 2024-08-04 MED ORDER — METOPROLOL SUCCINATE ER 50 MG TABLET,EXTENDED RELEASE 24 HR
ORAL_TABLET | Freq: Every day | ORAL | 0 refills | 90.00000 days | Status: CP
Start: 2024-08-04 — End: ?

## 2024-08-04 MED ORDER — CITALOPRAM 20 MG TABLET
ORAL_TABLET | Freq: Every day | ORAL | 0 refills | 90.00000 days | Status: CP
Start: 2024-08-04 — End: ?

## 2024-08-04 MED FILL — DABIGATRAN ETEXILATE 150 MG CAPSULE: ORAL | 30 days supply | Qty: 60 | Fill #3

## 2024-08-06 DIAGNOSIS — J441 Chronic obstructive pulmonary disease with (acute) exacerbation: Principal | ICD-10-CM

## 2024-08-09 DIAGNOSIS — J479 Bronchiectasis, uncomplicated: Principal | ICD-10-CM

## 2024-08-12 DIAGNOSIS — J449 Chronic obstructive pulmonary disease, unspecified: Principal | ICD-10-CM

## 2024-08-12 DIAGNOSIS — R110 Nausea: Principal | ICD-10-CM

## 2024-08-12 MED ORDER — PREDNISONE 10 MG TABLET
ORAL_TABLET | Freq: Every day | ORAL | 0 refills | 30.00000 days
Start: 2024-08-12 — End: ?

## 2024-08-12 MED ORDER — ONDANSETRON 4 MG DISINTEGRATING TABLET
ORAL_TABLET | Freq: Three times a day (TID) | ORAL | 0 refills | 10.00000 days | PRN
Start: 2024-08-12 — End: 2025-04-28

## 2024-08-13 MED ORDER — PREDNISONE 10 MG TABLET
ORAL_TABLET | Freq: Every day | ORAL | 0 refills | 30.00000 days | Status: CP
Start: 2024-08-13 — End: ?
  Filled 2024-08-17: qty 30, 30d supply, fill #0

## 2024-08-13 MED ORDER — ONDANSETRON 4 MG DISINTEGRATING TABLET
ORAL_TABLET | Freq: Three times a day (TID) | ORAL | 0 refills | 10.00000 days | Status: CP | PRN
Start: 2024-08-13 — End: 2025-04-29
  Filled 2024-08-17: qty 30, 10d supply, fill #0

## 2024-08-17 MED FILL — AZITHROMYCIN 250 MG TABLET: ORAL | 90 days supply | Qty: 90 | Fill #1

## 2024-08-24 DIAGNOSIS — J441 Chronic obstructive pulmonary disease with (acute) exacerbation: Principal | ICD-10-CM

## 2024-08-24 MED ORDER — BUDESONIDE 160 MCG-GLYCOPYR 9 MCG-FORMOT 4.8 MCG/ACTUATION HFA INHALER
Freq: Two times a day (BID) | RESPIRATORY_TRACT | 3 refills | 0.00000 days | Status: CP
Start: 2024-08-24 — End: ?

## 2024-08-27 ENCOUNTER — Inpatient Hospital Stay: Admit: 2024-08-27 | Discharge: 2024-08-27 | Payer: MEDICARE

## 2024-08-31 MED FILL — PANTOPRAZOLE 40 MG TABLET,DELAYED RELEASE: ORAL | 30 days supply | Qty: 60 | Fill #1

## 2024-09-12 DIAGNOSIS — J479 Bronchiectasis, uncomplicated: Principal | ICD-10-CM

## 2024-09-12 MED ORDER — NUCALA 100 MG/ML SUBCUTANEOUS AUTO-INJECTOR
SUBCUTANEOUS | 6 refills | 28.00000 days | Status: CP
Start: 2024-09-12 — End: ?

## 2024-09-15 DIAGNOSIS — J479 Bronchiectasis, uncomplicated: Principal | ICD-10-CM

## 2024-09-25 DIAGNOSIS — J449 Chronic obstructive pulmonary disease, unspecified: Principal | ICD-10-CM

## 2024-09-25 DIAGNOSIS — R110 Nausea: Principal | ICD-10-CM

## 2024-09-25 MED ORDER — PREDNISONE 10 MG TABLET
ORAL_TABLET | Freq: Every day | ORAL | 0 refills | 30.00000 days
Start: 2024-09-25 — End: ?

## 2024-09-25 MED ORDER — ONDANSETRON 4 MG DISINTEGRATING TABLET
ORAL_TABLET | Freq: Three times a day (TID) | ORAL | 0 refills | 10.00000 days | PRN
Start: 2024-09-25 — End: 2025-06-11

## 2024-09-27 MED ORDER — ONDANSETRON 4 MG DISINTEGRATING TABLET
ORAL_TABLET | Freq: Three times a day (TID) | ORAL | 0 refills | 10.00000 days | Status: CP | PRN
Start: 2024-09-27 — End: 2025-06-13
  Filled 2024-09-28: qty 30, 10d supply, fill #0

## 2024-09-27 MED ORDER — PREDNISONE 10 MG TABLET
ORAL_TABLET | Freq: Every day | ORAL | 0 refills | 30.00000 days | Status: CP
Start: 2024-09-27 — End: ?
  Filled 2024-09-28: qty 30, 30d supply, fill #0

## 2024-09-28 MED FILL — DABIGATRAN ETEXILATE 150 MG CAPSULE: ORAL | 30 days supply | Qty: 60 | Fill #4

## 2024-09-28 MED FILL — PANTOPRAZOLE 40 MG TABLET,DELAYED RELEASE: ORAL | 30 days supply | Qty: 60 | Fill #2

## 2024-10-29 DIAGNOSIS — F419 Anxiety disorder, unspecified: Principal | ICD-10-CM

## 2024-10-29 DIAGNOSIS — J449 Chronic obstructive pulmonary disease, unspecified: Secondary | ICD-10-CM

## 2024-10-29 DIAGNOSIS — R110 Nausea: Principal | ICD-10-CM

## 2024-10-29 MED ORDER — METOPROLOL SUCCINATE ER 50 MG TABLET,EXTENDED RELEASE 24 HR
ORAL_TABLET | Freq: Every day | ORAL | 0 refills | 90.00000 days | Status: CP
Start: 2024-10-29 — End: ?

## 2024-10-29 MED ORDER — CITALOPRAM 20 MG TABLET
ORAL_TABLET | Freq: Every day | ORAL | 1 refills | 100.00000 days | Status: CP
Start: 2024-10-29 — End: ?

## 2024-10-29 MED ORDER — PREDNISONE 10 MG TABLET
ORAL_TABLET | Freq: Every day | ORAL | 0 refills | 30.00000 days | Status: CP
Start: 2024-10-29 — End: ?

## 2024-10-29 MED ORDER — ONDANSETRON 4 MG DISINTEGRATING TABLET
ORAL_TABLET | Freq: Three times a day (TID) | ORAL | 1 refills | 10.00000 days | Status: CP | PRN
Start: 2024-10-29 — End: 2025-07-15
  Filled 2024-11-03: qty 30, 10d supply, fill #0

## 2024-10-30 DIAGNOSIS — F419 Anxiety disorder, unspecified: Principal | ICD-10-CM

## 2024-10-30 MED ORDER — CITALOPRAM 20 MG TABLET
ORAL_TABLET | Freq: Every day | ORAL | 0 refills | 0.00000 days
Start: 2024-10-30 — End: ?

## 2024-11-01 MED ORDER — CITALOPRAM 20 MG TABLET
ORAL_TABLET | Freq: Every day | ORAL | 0 refills | 90.00000 days
Start: 2024-11-01 — End: ?

## 2024-11-02 DIAGNOSIS — J449 Chronic obstructive pulmonary disease, unspecified: Principal | ICD-10-CM

## 2024-11-02 MED ORDER — PREDNISONE 10 MG TABLET
ORAL_TABLET | Freq: Every day | ORAL | 0 refills | 30.00000 days | Status: CP
Start: 2024-11-02 — End: ?
  Filled 2024-11-03: qty 30, 30d supply, fill #0

## 2024-11-03 MED FILL — AZITHROMYCIN 250 MG TABLET: ORAL | 90 days supply | Qty: 90 | Fill #2

## 2024-11-03 MED FILL — PANTOPRAZOLE 40 MG TABLET,DELAYED RELEASE: ORAL | 30 days supply | Qty: 60 | Fill #3
